# Patient Record
Sex: Female | Born: 1970 | Race: White | Hispanic: No | Marital: Married | Smoking: Former smoker
Health system: Southern US, Community
[De-identification: ages and names within clinical notes are randomized; demographics above are authoritative.]

## PROBLEM LIST (undated history)

## (undated) DIAGNOSIS — F419 Anxiety disorder, unspecified: Secondary | ICD-10-CM

## (undated) HISTORY — PX: WISDOM TOOTH EXTRACTION: SHX21

---

## 2021-03-27 DIAGNOSIS — C801 Malignant (primary) neoplasm, unspecified: Secondary | ICD-10-CM

## 2021-03-27 HISTORY — DX: Malignant (primary) neoplasm, unspecified: C80.1

## 2021-04-01 ENCOUNTER — Encounter (HOSPITAL_COMMUNITY): Payer: Self-pay

## 2021-04-01 ENCOUNTER — Other Ambulatory Visit: Payer: Self-pay

## 2021-04-01 ENCOUNTER — Ambulatory Visit (HOSPITAL_COMMUNITY)
Admission: EM | Admit: 2021-04-01 | Discharge: 2021-04-01 | Disposition: A | Discharge status: Home / Self Care | Payer: 59 | Attending: Emergency Medicine | Admitting: Emergency Medicine

## 2021-04-01 DIAGNOSIS — N644 Mastodynia: Secondary | ICD-10-CM | POA: Diagnosis not present

## 2021-04-01 DIAGNOSIS — N6320 Unspecified lump in the left breast, unspecified quadrant: Secondary | ICD-10-CM

## 2021-04-01 DIAGNOSIS — N6452 Nipple discharge: Secondary | ICD-10-CM | POA: Diagnosis not present

## 2021-04-01 DIAGNOSIS — N63 Unspecified lump in unspecified breast: Secondary | ICD-10-CM

## 2021-04-01 NOTE — Discharge Instructions (Addendum)
If you have not heard from the [facility] by the end of the day, call to schedule an appointment.    Follow up with your primary care provider.

## 2021-04-01 NOTE — ED Notes (Signed)
Fax sent to [facility]

## 2021-04-01 NOTE — ED Triage Notes (Signed)
Pt presents with left breast pain x 2 months. Reports she had discharge this morning, can't remember the color as she was afraid to check. Pt reports she is going throw menopause since Feb 2022.

## 2021-04-01 NOTE — ED Provider Notes (Signed)
[facility]    CSN: [id number] Arrival date & time: 04/01/21  1121      History   Chief Complaint Chief Complaint  Patient presents with  . Breast Pain    HPI Sheena Mcdaniel is a 50 y.o. female.   Patient is here for evaluation of left breast pain and discharge.  Reports developed left sided breast pain and tenderness approximately 2 months ago.  Reports this morning noticed some crusting and discharge on her pajama top.  Denies any redness or swelling.  Reports breast "feels tight."  Reports having irregular periods and believes that she is in menopause.  Initially thought breast pain was related to hormonal changes and has not been evaluated for this previously. Denies any specific alleviating or aggravating factors.  Denies any fevers, chest pain, shortness of breath, N/V/D, numbness, tingling, weakness, abdominal pain, or headaches.   ROS: As per HPI, all other pertinent ROS negative   The history is provided by the patient.    History reviewed. No pertinent past medical history.  There are no problems to display for this patient.   History reviewed. No pertinent surgical history.  OB History   No obstetric history on file.      Home Medications    Prior to Admission medications   Not on File    Family History History reviewed. No pertinent family history.  Social History Social History   Tobacco Use  . Smoking status: Never Smoker  . Smokeless tobacco: Never Used  Substance Use Topics  . Alcohol use: Yes  . Drug use: Never     Allergies   Patient has no known allergies.   Review of Systems Review of Systems  All other systems reviewed and are negative.    Physical Exam Triage Vital Signs ED Triage Vitals  Enc Vitals Group     BP 04/01/21 1210 (!) 164/112     Pulse Rate 04/01/21 1210 (!) 118     Resp 04/01/21 1210 18     Temp 04/01/21 1210 98.4 F (36.9 C)     Temp Source 04/01/21 1210 Oral     SpO2 04/01/21 1210 99 %      Weight --      Height --      Head Circumference --      Peak Flow --      Pain Score 04/01/21 1207 9     Pain Loc --      Pain Edu? --      Excl. in [place]? --    No data found.  Updated Vital Signs BP (!) 164/112 (BP Location: Left Arm)   Pulse (!) 118   Temp 98.4 F (36.9 C) (Oral)   Resp 18   LMP  (Within Months) Comment: 1 month  SpO2 99%   Visual Acuity Right Eye Distance:   Left Eye Distance:   Bilateral Distance:    Right Eye Near:   Left Eye Near:    Bilateral Near:     Physical Exam Vitals and nursing note reviewed.  Constitutional:      General: She is not in acute distress.    Appearance: Normal appearance. She is not ill-appearing, toxic-appearing or diaphoretic.  HENT:     Head: Normocephalic and atraumatic.  Eyes:     Conjunctiva/sclera: Conjunctivae normal.  Cardiovascular:     Rate and Rhythm: Normal rate.     Pulses: Normal pulses.  Pulmonary:     Effort: Pulmonary effort is  normal.  Chest:  Breasts:     Right: Normal.     Left: Mass, nipple discharge and tenderness present. No axillary adenopathy or supraclavicular adenopathy.        Comments: Firm, non-mobile, tender mass to left breast Abdominal:     General: Abdomen is flat.  Musculoskeletal:        General: Normal range of motion.     Cervical back: Normal range of motion.  Lymphadenopathy:     Upper Body:     Left upper body: No supraclavicular, axillary or pectoral adenopathy.  Skin:    General: Skin is warm and dry.  Neurological:     General: No focal deficit present.     Mental Status: She is alert and oriented to person, place, and time.  Psychiatric:        Mood and Affect: Mood normal.      UC Treatments / Results  Labs (all labs ordered are listed, but only abnormal results are displayed) Labs Reviewed - No data to display  EKG   Radiology No results found.  Procedures Procedures (including critical care time)  Medications Ordered in UC Medications -  No data to display  Initial Impression / Assessment and Plan / UC Course  I have reviewed the triage vital signs and the nursing notes.  Pertinent labs & imaging results that were available during my care of the patient were reviewed by me and considered in my medical decision making (see chart for details).    Left breast tenderness, breast mass, nipple discharge Concern for left breast abscess or carcinoma.  Orders placed for diagnostic mammogram and ultrasound.  We will follow up on results PCP referral placed.   Final Clinical Impressions(s) / UC Diagnoses   Final diagnoses:  Breast tenderness  Nipple discharge  Breast mass     Discharge Instructions     If you have not heard from the [facility] by the end of the day, call to schedule an appointment.    Follow up with your primary care provider.        ED Prescriptions    None     PDMP not reviewed this encounter.   Pearson Forster, NP 04/01/21 [phone]

## 2021-04-01 NOTE — ED Notes (Signed)
Attempted to call [facility] . Apolonio Schneiders L,PA aware, Will fax form to [facility]

## 2021-04-01 NOTE — ED Notes (Signed)
Blood pressure reported to Vickki Muff, NP.

## 2021-04-24 ENCOUNTER — Other Ambulatory Visit: Payer: 59

## 2021-04-24 ENCOUNTER — Other Ambulatory Visit: Payer: Self-pay

## 2021-04-24 ENCOUNTER — Encounter: Payer: Self-pay | Admitting: Internal Medicine

## 2021-04-24 ENCOUNTER — Ambulatory Visit: Payer: 59 | Admitting: Internal Medicine

## 2021-04-24 VITALS — BP 162/80 | HR 87 | Temp 97.8°F | Ht 63.75 in | Wt 165.3 lb

## 2021-04-24 DIAGNOSIS — N644 Mastodynia: Secondary | ICD-10-CM | POA: Diagnosis not present

## 2021-04-24 DIAGNOSIS — N951 Menopausal and female climacteric states: Secondary | ICD-10-CM | POA: Diagnosis not present

## 2021-04-24 DIAGNOSIS — R030 Elevated blood-pressure reading, without diagnosis of hypertension: Secondary | ICD-10-CM | POA: Insufficient documentation

## 2021-04-24 MED ORDER — CEPHALEXIN 500 MG PO CAPS: 500.0000 mg | ORAL_CAPSULE | Freq: Three times a day (TID) | ORAL | 0 refills | Status: AC

## 2021-04-24 NOTE — Patient Instructions (Signed)
Thank you for allowing [place] to provide your care today. Today we discussed your left breast pain.    I have put in an order for a left breast US. I have also sent in a prescription for cephalexin 500 mg three time a day.  Please follow-up in 1-2 months.    Should you have any questions or concerns please call the internal medicine clinic at [phone].

## 2021-04-24 NOTE — Assessment & Plan Note (Signed)
Patient reports perimenopausal symptoms.  States in February of this year she noticed changes in her menstruation.  She has had regular periods since they started.  She states a few months ago she had a very short period lasting for about 3 days followed with no cycle the following month.  She states her menstruation last anywhere from 3 to 8 days.  In the past her cycle was about 5 days long.  She has had a few months with no cycle.

## 2021-04-24 NOTE — Assessment & Plan Note (Signed)
Patient presents for evaluation of her left breast pain and drainage.  States that in February she noticed significant engorgement and left-sided firmness of her left breast.  She describes dull sharp and achy intermittent pain ongoing since February.  A few weeks ago she noticed significant drainage after waking up going from supine to sitting position.  She reports a gushing sensation with significant straw-colored drainage.  She states that the pain feels similar to mastitis which she experienced when she used to breast-feed.  No family history of breast cancer or other cancers.  She has never had a mammogram.  She was scheduled to go today however due to the significant pain she could not tolerate a mammogram.  She states that she has been taking Tylenol and ibuprofen as well as alternating heat and ice therapy all of which have provided relief.  She states that heat and ice cause the firmness to decrease.  Denies any fevers, nausea or vomiting.  Does endorse chills.  Assessment/plan: Differential diagnosis is concerning for inflammatory breast cancer versus abscess.  -Referral for left breast ultrasound -Will treat empirically with cephalexin 500 mg 3 times daily in the setting of possible abscess

## 2021-04-24 NOTE — Progress Notes (Signed)
   CC: establish care, breast pain  HPI:  Ms.Jalisha Abie Killian is a 50 y.o. with no past medical history presenting for evaluation of her left breast pain and to establish care. For details of today's visit and the status of his chronic medical issues please refer to the assessment and plan.   No past medical history on file.   Surgical history: None.  Allergies: No known drug allergies, seasonal allergies to pollen  Social history: Lives at home with her husband and children.  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  Denies any tobacco or illicit drug use.  Seldomly drinks alcohol on social occasions.  Review of Systems:   Review of Systems  Constitutional: Positive for chills. Negative for fever and weight loss.  Respiratory: Negative for shortness of breath.   Cardiovascular: Negative for chest pain and leg swelling.  Gastrointestinal: Negative for abdominal pain, nausea and vomiting.  Musculoskeletal: Positive for myalgias.  Skin: Negative for itching and rash.  Neurological: Negative for dizziness, weakness and headaches.     Physical Exam:  Vitals:   04/24/21 1049  BP: (!) 159/88  Pulse: (!) 108  SpO2: 100%  Weight: 165 lb 4.8 oz (75 kg)  Height: 5' 3.75" (1.619 m)   Physical Exam Vitals reviewed.  Constitutional:      Appearance: Normal appearance.  Cardiovascular:     Rate and Rhythm: Regular rhythm. Tachycardia present.     Pulses: Normal pulses.     Heart sounds: Normal heart sounds. No murmur heard. No friction rub. No gallop.   Pulmonary:     Effort: Pulmonary effort is normal. No respiratory distress.     Breath sounds: Normal breath sounds. No wheezing or rales.  Chest:  Breasts:     Left: No axillary adenopathy.    Abdominal:     General: Abdomen is flat. Bowel sounds are normal. There is no distension.     Palpations: Abdomen is soft.     Tenderness: There is no abdominal tenderness. There  is no guarding.  Musculoskeletal:        General: Swelling and tenderness present.     Comments: Significant engorgement of the left breast with tenderness along the left lateral breast.  Straw-colored drainage.  Firm to touch along the lateral half of the breast.  Significant tenderness to palpation.    Lymphadenopathy:     Upper Body:     Left upper body: No axillary adenopathy.  Skin:    General: Skin is warm and dry.  Neurological:     Mental Status: She is alert and oriented to person, place, and time.  Psychiatric:        Mood and Affect: Mood normal.        Behavior: Behavior normal.        Thought Content: Thought content normal.        Judgment: Judgment normal.     Assessment & Plan:   See Encounters Tab for problem based charting.  Patient discussed with Dr. Jimmye Norman

## 2021-04-24 NOTE — Assessment & Plan Note (Signed)
BP Readings from Last 3 Encounters:  04/24/21 (!) 162/80  04/01/21 (!) 164/112   Patient presents today to establish care and for evaluation of her left breast pain.  Elevated blood pressure readings appreciated.  No history of hypertension.  States her blood pressures are normally normotensive.  This is likely situational in the setting of pain and anxiety.  Will continue to monitor at follow-up visits.

## 2021-04-30 NOTE — Progress Notes (Signed)
Internal Medicine Clinic Attending ° °Case discussed with Dr. Rehman  At the time of the visit.  We reviewed the resident’s history and exam and pertinent patient test results.  I agree with the assessment, diagnosis, and plan of care documented in the resident’s note.  ° °

## 2021-05-04 ENCOUNTER — Other Ambulatory Visit: Payer: Self-pay

## 2021-05-04 ENCOUNTER — Ambulatory Visit
Admission: RE | Admit: 2021-05-04 | Discharge: 2021-05-04 | Disposition: A | Discharge status: Home / Self Care | Payer: 59 | Source: Ambulatory Visit | Attending: Emergency Medicine | Admitting: Emergency Medicine

## 2021-05-04 ENCOUNTER — Other Ambulatory Visit: Payer: Self-pay | Admitting: Emergency Medicine

## 2021-05-04 ENCOUNTER — Other Ambulatory Visit (HOSPITAL_COMMUNITY): Payer: Self-pay | Admitting: Emergency Medicine

## 2021-05-04 DIAGNOSIS — N6320 Unspecified lump in the left breast, unspecified quadrant: Secondary | ICD-10-CM

## 2021-05-04 DIAGNOSIS — N6310 Unspecified lump in the right breast, unspecified quadrant: Secondary | ICD-10-CM

## 2021-05-04 DIAGNOSIS — R599 Enlarged lymph nodes, unspecified: Secondary | ICD-10-CM

## 2021-05-04 DIAGNOSIS — N6452 Nipple discharge: Secondary | ICD-10-CM

## 2021-05-04 DIAGNOSIS — N644 Mastodynia: Secondary | ICD-10-CM

## 2021-05-04 IMAGING — MG DIGITAL DIAGNOSTIC BILAT W/ TOMO W/ CAD
8 of 14 series · 8 of 40 positions shown · non-contrast
Comparison: None.

CLINICAL DATA: 50-year-old female with increasingly painful and
enlarging left breast lump over the last several months. The patient
also has had profuse, spontaneous serous left nipple discharge in
that time.

EXAM:
DIGITAL DIAGNOSTIC BILATERAL MAMMOGRAM WITH TOMOSYNTHESIS AND CAD;
ULTRASOUND LEFT BREAST LIMITED; ULTRASOUND RIGHT BREAST LIMITED
TECHNIQUE: Bilateral digital diagnostic mammography and breast tomosynthesis
was performed. The images were evaluated with computer-aided
detection.; Targeted ultrasound examination of the left breast was
performed; Targeted ultrasound examination of the right breast was
performed

[L MLO synth-2D (1 of 2)]
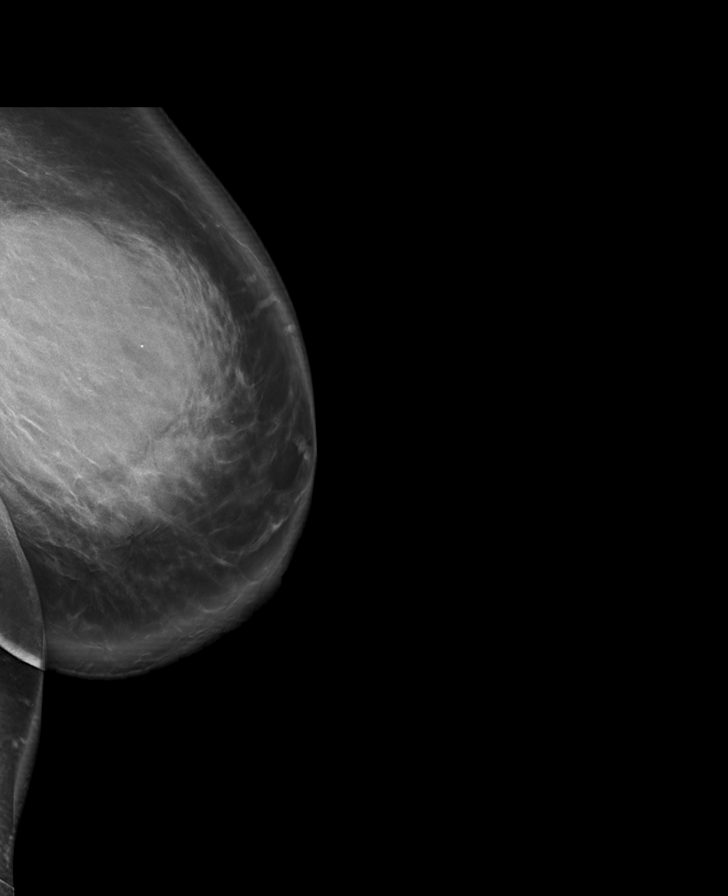

[R MLO synth-2D]
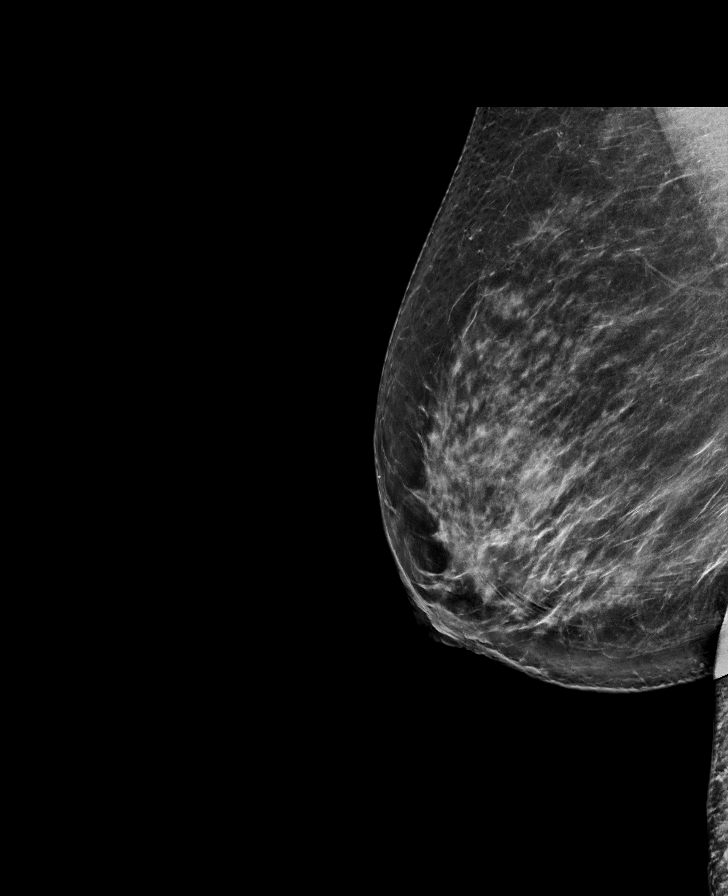

[R CC synth-2D (1 of 3)]
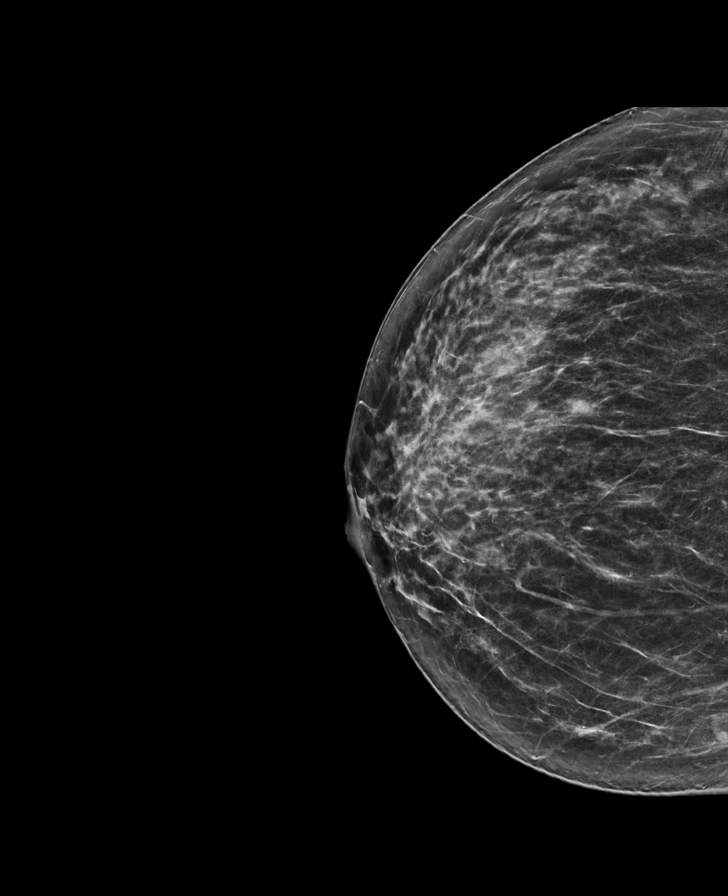

[R CC synth-2D (2 of 3)]
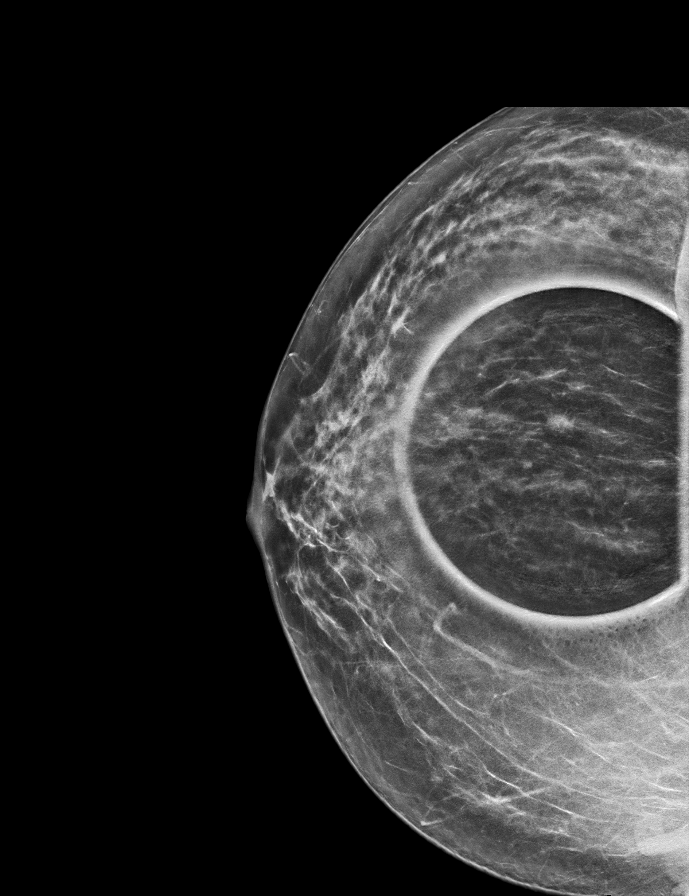

[R CC synth-2D (3 of 3)]
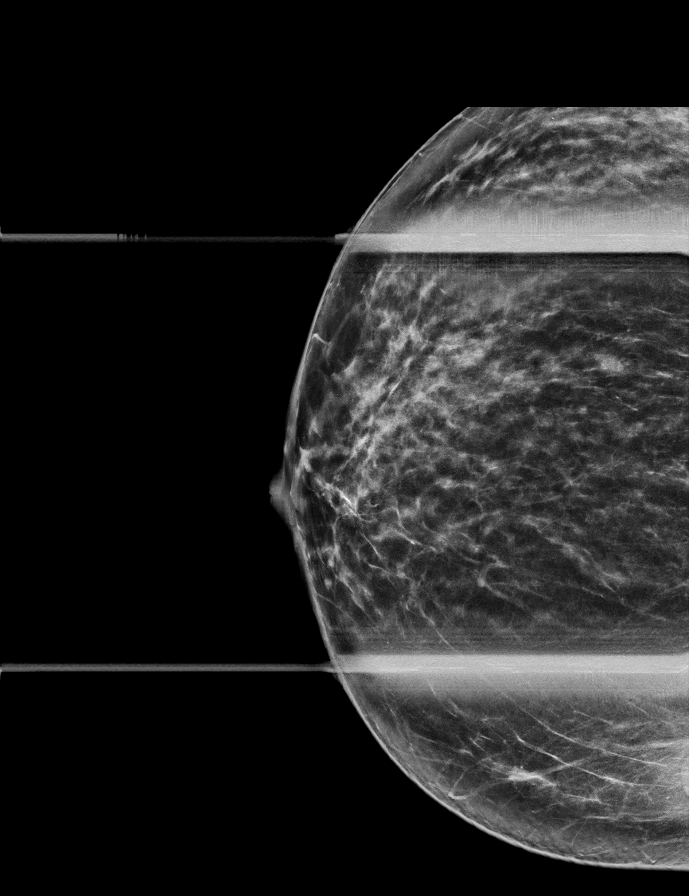

[L CC synth-2D]
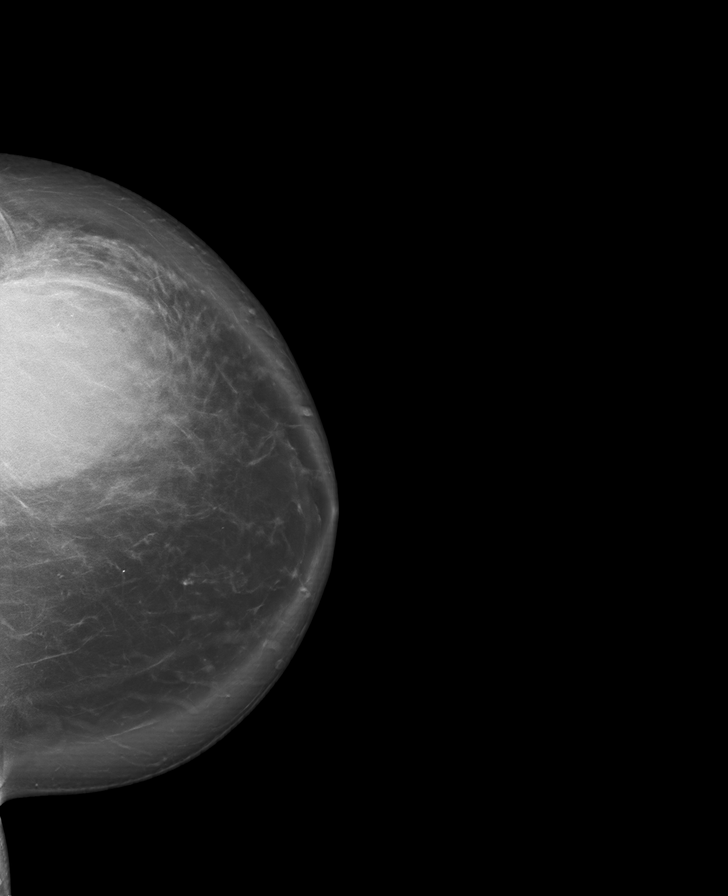

[L MLO synth-2D (2 of 2)]
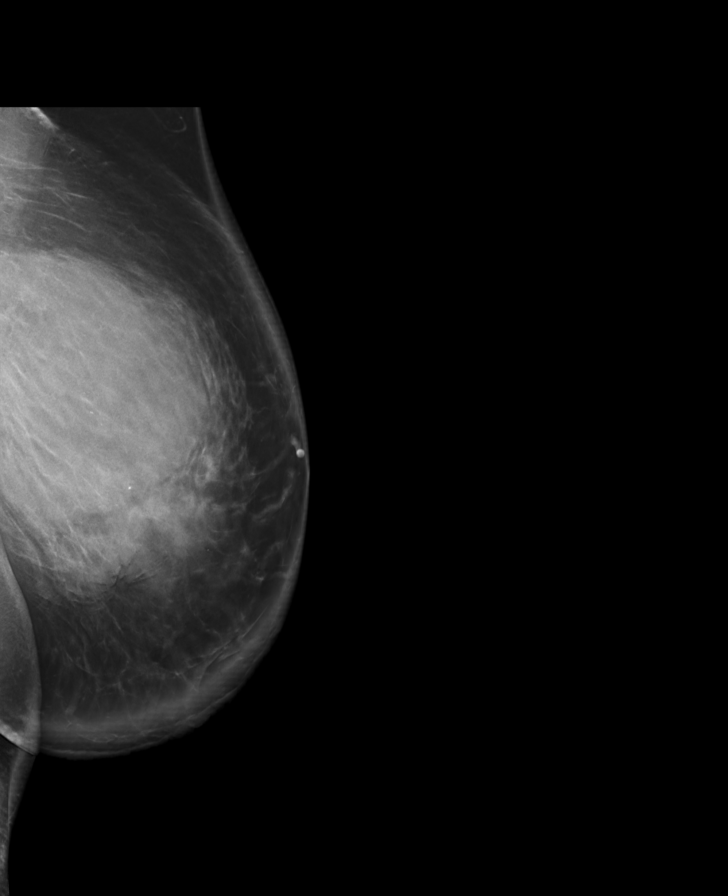

[L CC tomo · tomo slice 59/116.0]
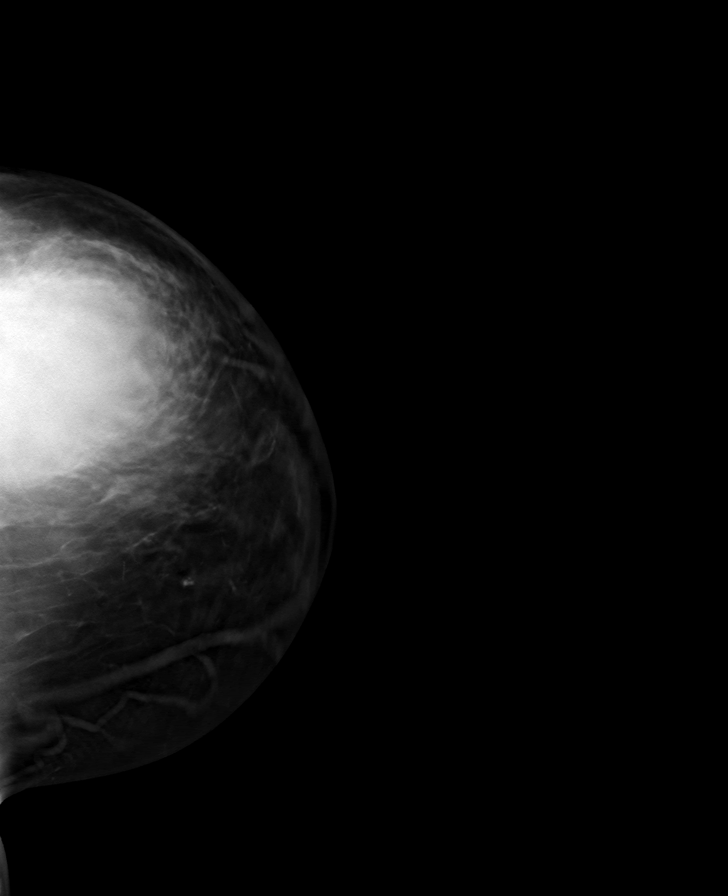

[8 of 40 positions shown; findings below may reference images not displayed]

ACR Breast Density Category c: The breast tissue is heterogeneously
dense, which may obscure small masses.
FINDINGS: There is a large oval, circumscribed hyperdense mass involving the
entire lateral left breast with associated trabecular thickening.
Further evaluation with ultrasound was performed.

There is a persistent oval, circumscribed equal density mass in the
lateral right breast at middle depth. It is best seen on the cc
projection. It localizes superficially on tomosynthesis. No
additional suspicious findings on the right.

On physical exam, I palpate a large, firm and mobile mass in the
deep central and lateral left breast. There is profuse yellow
discharge from the left nipple.

Targeted ultrasound is performed, showing a large, circumscribed
multi-cystic mass with associated vascularity involving the lateral
left breast. Exact measurements are difficult due to the size but
this area measures at least 9-10 cm. Evaluation of the left axilla
demonstrates multiple lymph nodes with diffuse cortical thickening
up to 5 mm.

Evaluation of the right breast demonstrates an oval, circumscribed
hypoechoic mass at the [DATE] position 4 cm from the nipple. It
measures 4 x 4 x 2 mm. There is no internal vascularity. This
correlates well with the mammographic finding.
IMPRESSION: 1. Suspicious, multi-cystic left breast mass measuring up to 9-10
cm. Recommendation is for ultrasound-guided biopsy and aspiration of
the internal cystic pockets for symptomatic relief.
2. Indeterminate left axillary lymph nodes. Recommendation is for
ultrasound-guided biopsy.
3. Probably benign, probable complicated cyst at the [DATE] position
of the right breast. Recommendation is for ultrasound-guided
aspiration for definitive diagnosis at the time of the patient's
other procedures.

RECOMMENDATION:
1. Two area ultrasound-guided biopsy of the left breast and left
axilla. Aspiration of the cystic pockets within the mass should also
be performed at this time for symptomatic relief and can be sent for
cytology.
2. Ultrasound-guided aspiration of a 4 mm mass within the right
breast. If this does not aspirate to completion, biopsy is
recommended.

I have discussed the findings and recommendations with the patient.
If applicable, a reminder letter will be sent to the patient
regarding the next appointment.

BI-RADS CATEGORY  4: Suspicious.

## 2021-05-04 IMAGING — US US BREAST*L* LIMITED INC AXILLA
1 series · 12 of 16 positions shown · non-contrast
Comparison: None.

CLINICAL DATA: 50-year-old female with increasingly painful and
enlarging left breast lump over the last several months. The patient
also has had profuse, spontaneous serous left nipple discharge in
that time.

EXAM:
DIGITAL DIAGNOSTIC BILATERAL MAMMOGRAM WITH TOMOSYNTHESIS AND CAD;
ULTRASOUND LEFT BREAST LIMITED; ULTRASOUND RIGHT BREAST LIMITED
TECHNIQUE: Bilateral digital diagnostic mammography and breast tomosynthesis
was performed. The images were evaluated with computer-aided
detection.; Targeted ultrasound examination of the left breast was
performed; Targeted ultrasound examination of the right breast was
performed

[Series 1: us breast*left* limited inc axilla · 0.13mm/px · 12 of 16 slices shown]
[im 1/16]
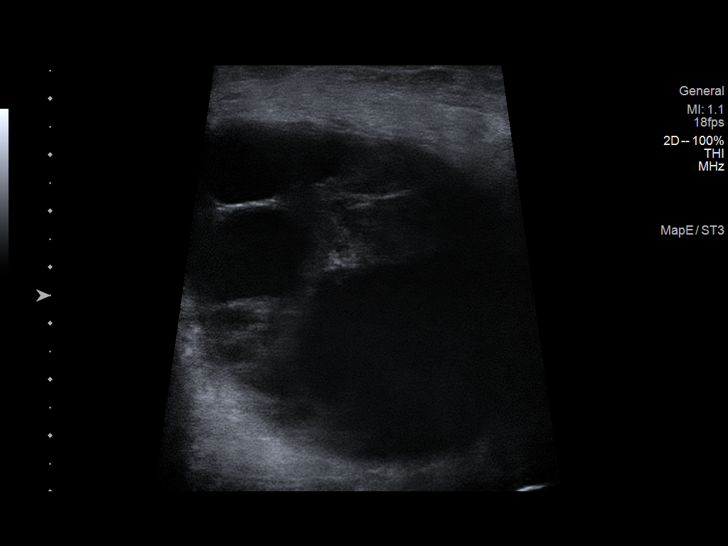
[im 3/16]
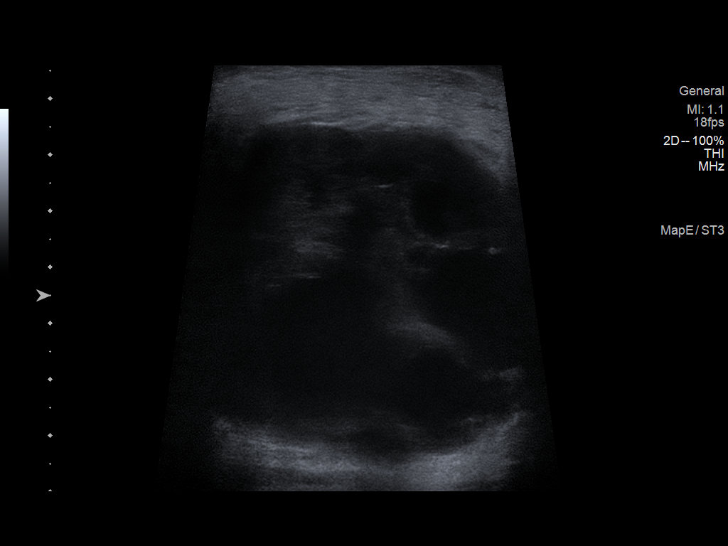
[im 4/16]
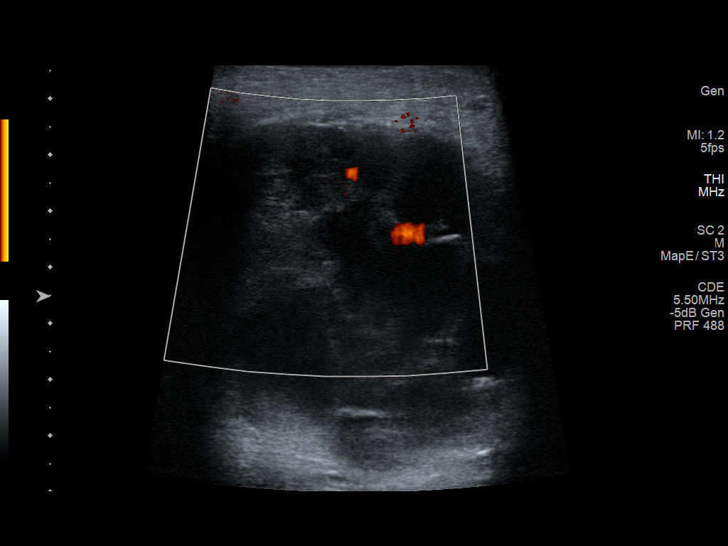
[im 5/16]
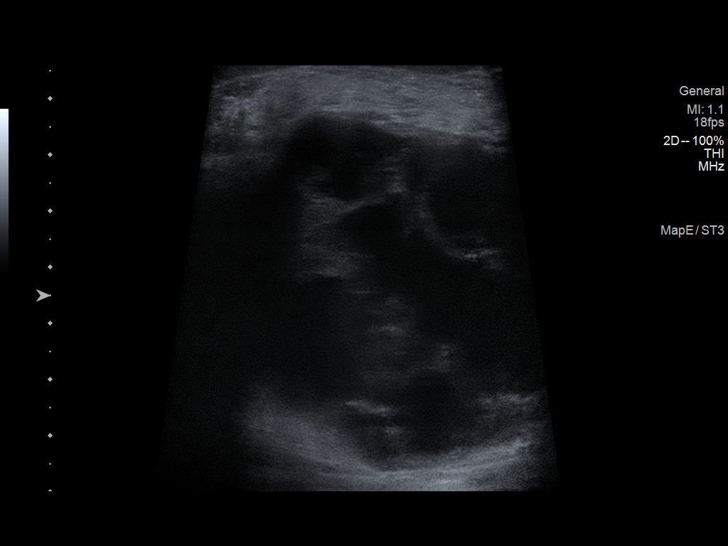
[im 7/16]
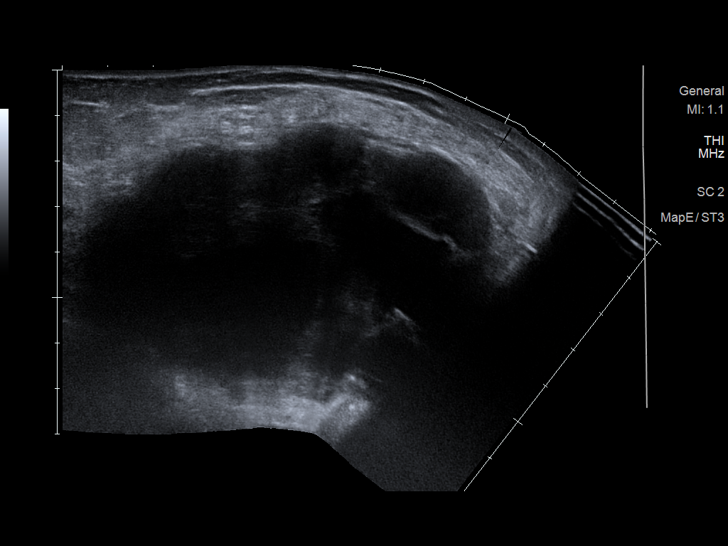
[im 8/16]
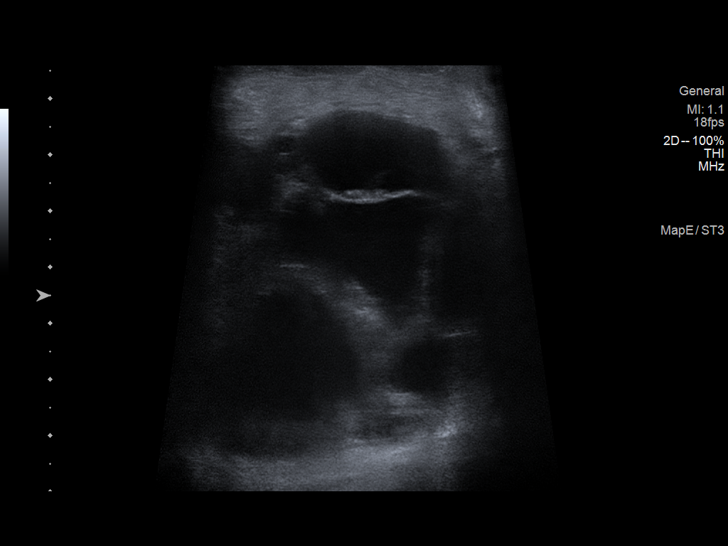
[im 9/16]
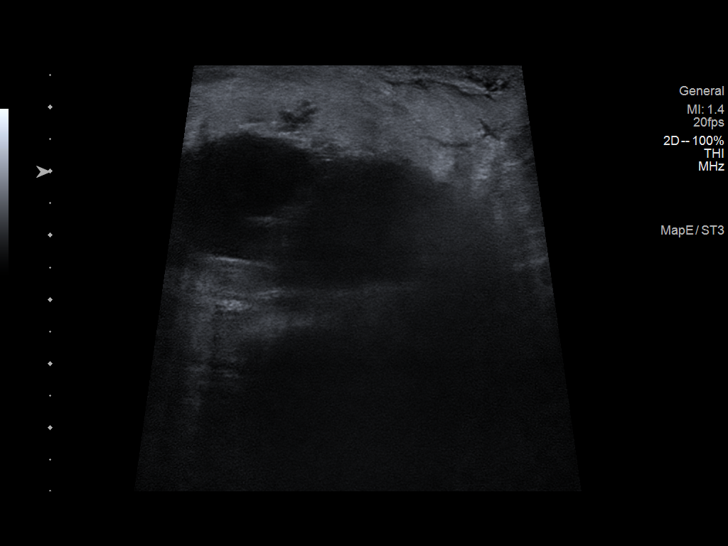
[im 11/16]
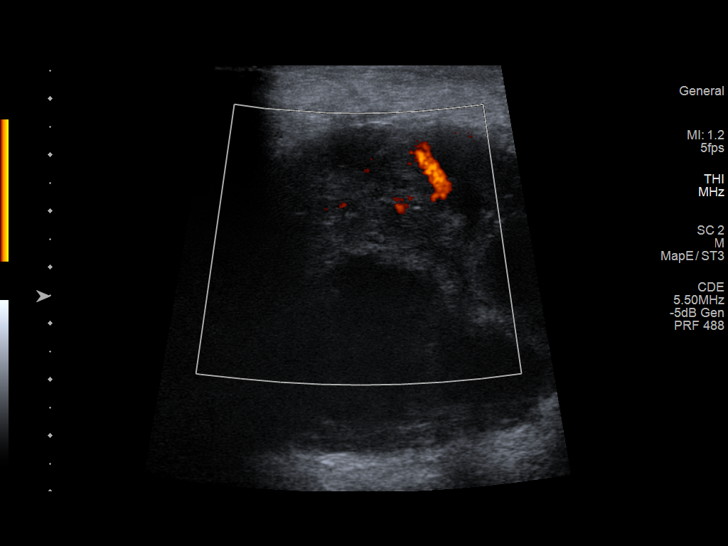
[im 12/16]
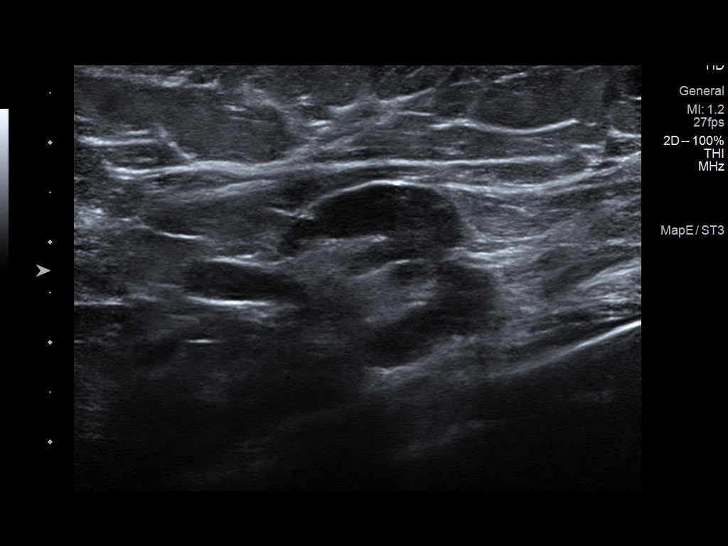
[im 13/16]
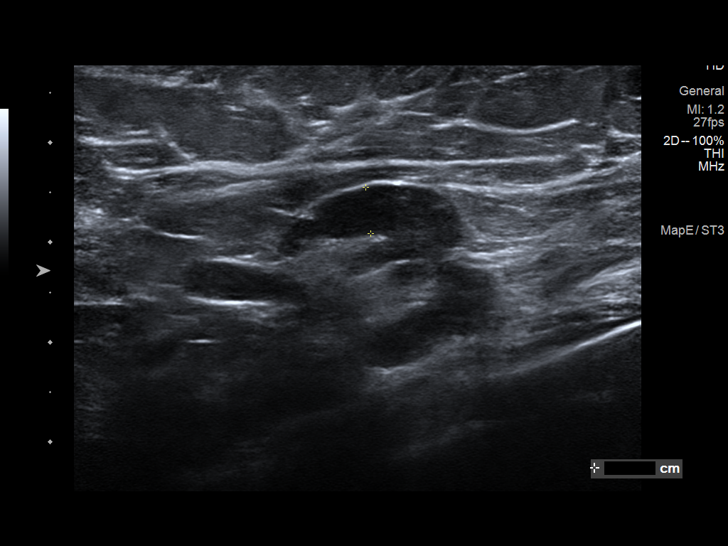
[im 15/16]
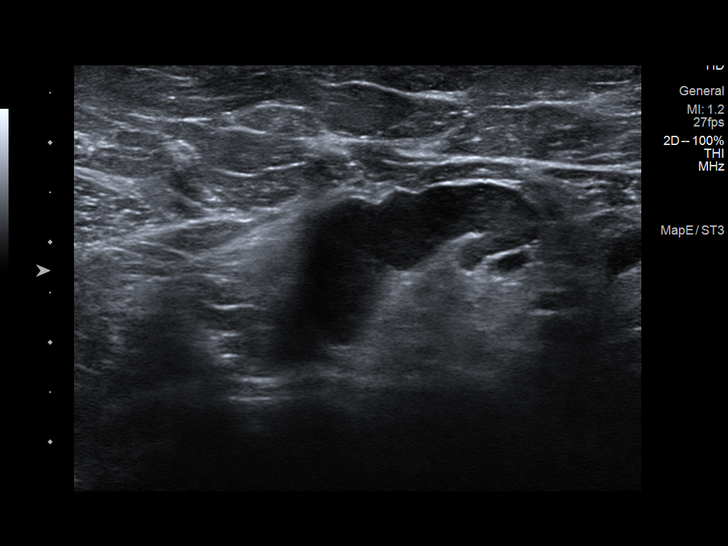
[im 16/16]
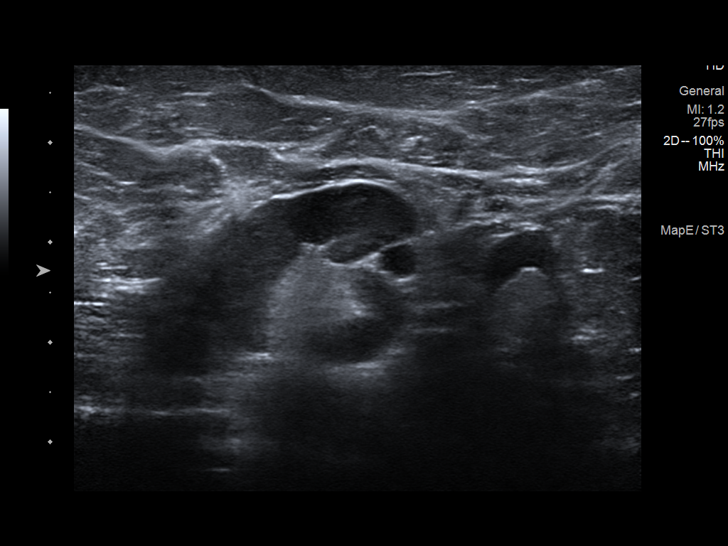

[12 of 16 positions shown; findings below may reference images not displayed]

ACR Breast Density Category c: The breast tissue is heterogeneously
dense, which may obscure small masses.
FINDINGS: There is a large oval, circumscribed hyperdense mass involving the
entire lateral left breast with associated trabecular thickening.
Further evaluation with ultrasound was performed.

There is a persistent oval, circumscribed equal density mass in the
lateral right breast at middle depth. It is best seen on the cc
projection. It localizes superficially on tomosynthesis. No
additional suspicious findings on the right.

On physical exam, I palpate a large, firm and mobile mass in the
deep central and lateral left breast. There is profuse yellow
discharge from the left nipple.

Targeted ultrasound is performed, showing a large, circumscribed
multi-cystic mass with associated vascularity involving the lateral
left breast. Exact measurements are difficult due to the size but
this area measures at least 9-10 cm. Evaluation of the left axilla
demonstrates multiple lymph nodes with diffuse cortical thickening
up to 5 mm.

Evaluation of the right breast demonstrates an oval, circumscribed
hypoechoic mass at the [DATE] position 4 cm from the nipple. It
measures 4 x 4 x 2 mm. There is no internal vascularity. This
correlates well with the mammographic finding.
IMPRESSION: 1. Suspicious, multi-cystic left breast mass measuring up to 9-10
cm. Recommendation is for ultrasound-guided biopsy and aspiration of
the internal cystic pockets for symptomatic relief.
2. Indeterminate left axillary lymph nodes. Recommendation is for
ultrasound-guided biopsy.
3. Probably benign, probable complicated cyst at the [DATE] position
of the right breast. Recommendation is for ultrasound-guided
aspiration for definitive diagnosis at the time of the patient's
other procedures.

RECOMMENDATION:
1. Two area ultrasound-guided biopsy of the left breast and left
axilla. Aspiration of the cystic pockets within the mass should also
be performed at this time for symptomatic relief and can be sent for
cytology.
2. Ultrasound-guided aspiration of a 4 mm mass within the right
breast. If this does not aspirate to completion, biopsy is
recommended.

I have discussed the findings and recommendations with the patient.
If applicable, a reminder letter will be sent to the patient
regarding the next appointment.

BI-RADS CATEGORY  4: Suspicious.

## 2021-05-06 ENCOUNTER — Ambulatory Visit
Admission: RE | Admit: 2021-05-06 | Discharge: 2021-05-06 | Disposition: A | Discharge status: Home / Self Care | Payer: 59 | Source: Ambulatory Visit | Attending: Emergency Medicine | Admitting: Emergency Medicine

## 2021-05-06 ENCOUNTER — Other Ambulatory Visit: Payer: Self-pay

## 2021-05-06 ENCOUNTER — Other Ambulatory Visit: Payer: Self-pay | Admitting: Emergency Medicine

## 2021-05-06 DIAGNOSIS — N6320 Unspecified lump in the left breast, unspecified quadrant: Secondary | ICD-10-CM

## 2021-05-06 DIAGNOSIS — N6310 Unspecified lump in the right breast, unspecified quadrant: Secondary | ICD-10-CM

## 2021-05-06 DIAGNOSIS — R599 Enlarged lymph nodes, unspecified: Secondary | ICD-10-CM

## 2021-05-06 IMAGING — US US BREAST BX W LOC DEV 1ST LESION IMG BX SPEC US GUIDE*L*
1 series · 7 of 7 positions shown · non-contrast
Comparison: Previous exam(s).
COMPARISON: Previous exam(s).

Addendum:
CLINICAL DATA: 50-year-old female with a suspicious, rapidly
enlarging left breast mass and suspicious left axillary
lymphadenopathy. The mass demonstrates both solid and cystic
components with profuse serous fluid drainage from the left nipple.
An additional indeterminate mass was also noted on the right side,
which was biopsied today.

EXAM:
ULTRASOUND GUIDED LEFT BREAST CORE NEEDLE BIOPSY
ULTRASOUND-GUIDED LEFT BREAST ASPIRATION
ULTRASOUND-GUIDED RIGHT BREAST CORE NEEDLE BIOPSY

[Series 1: us breast bx w loc dev 1st lesion img bx spec us g · 0.09mm/px · 7 of 7 slices shown]
[im 1/7]
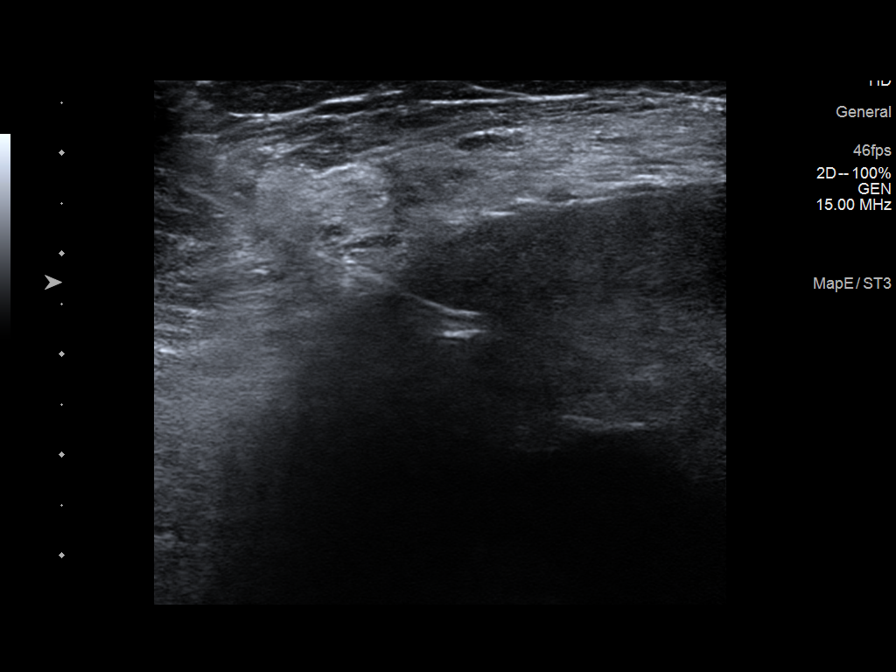
[im 2/7]
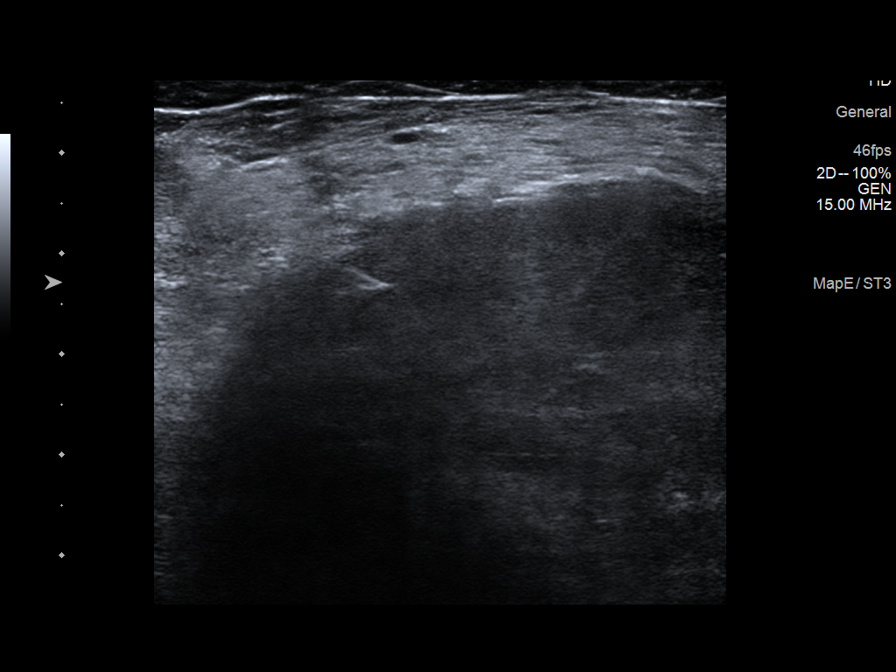
[im 3/7]
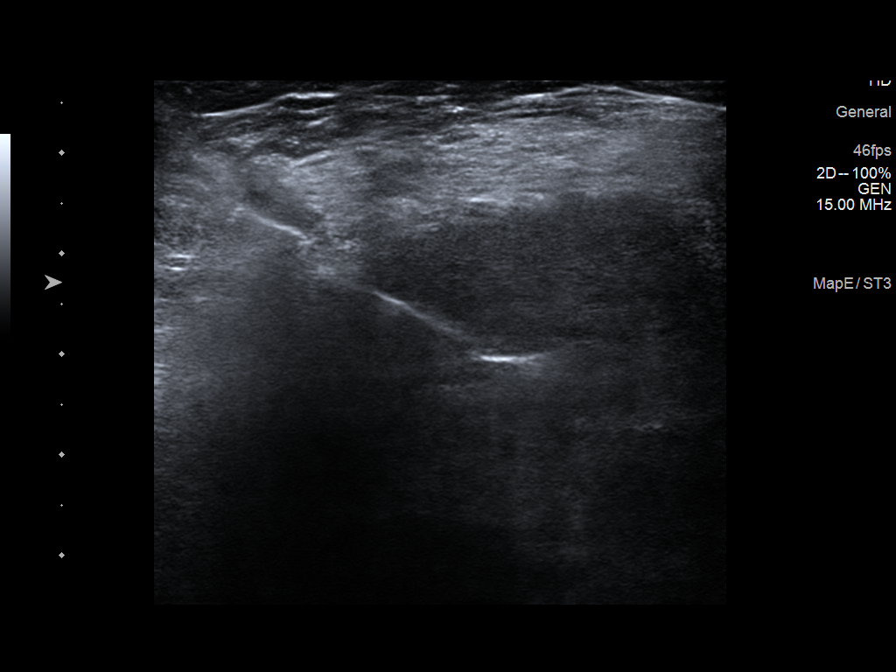
[im 4/7]
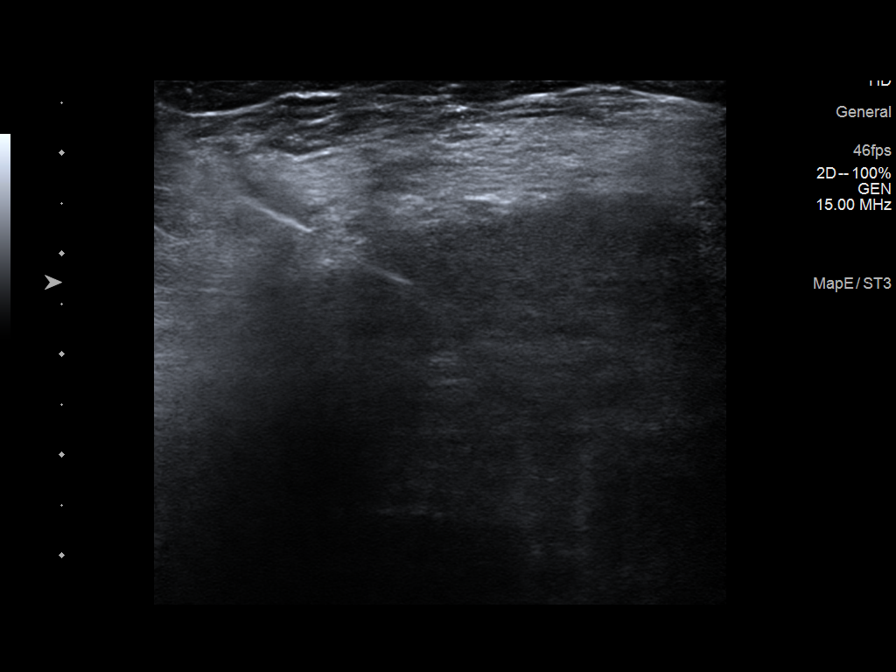
[im 5/7]
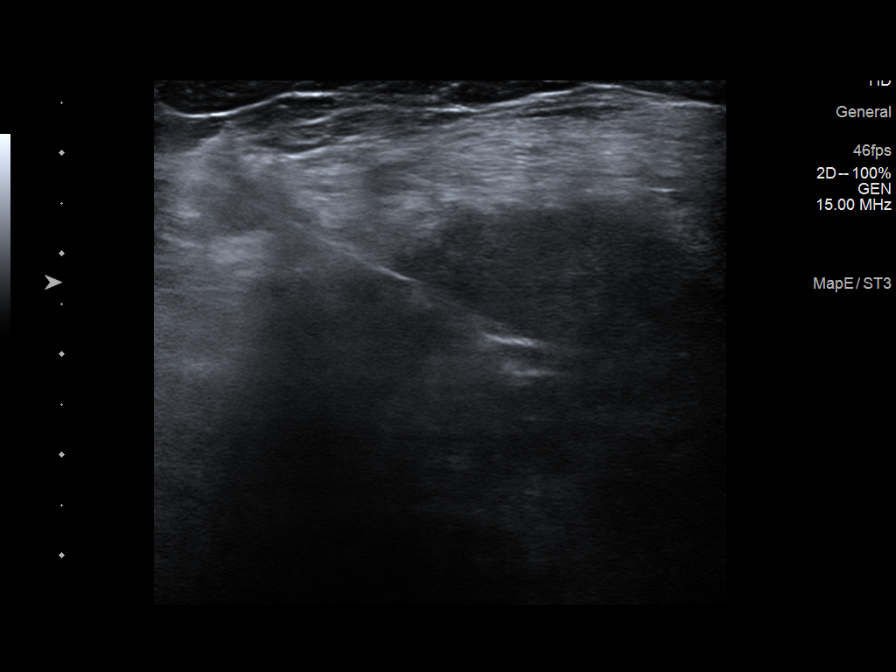
[im 6/7]
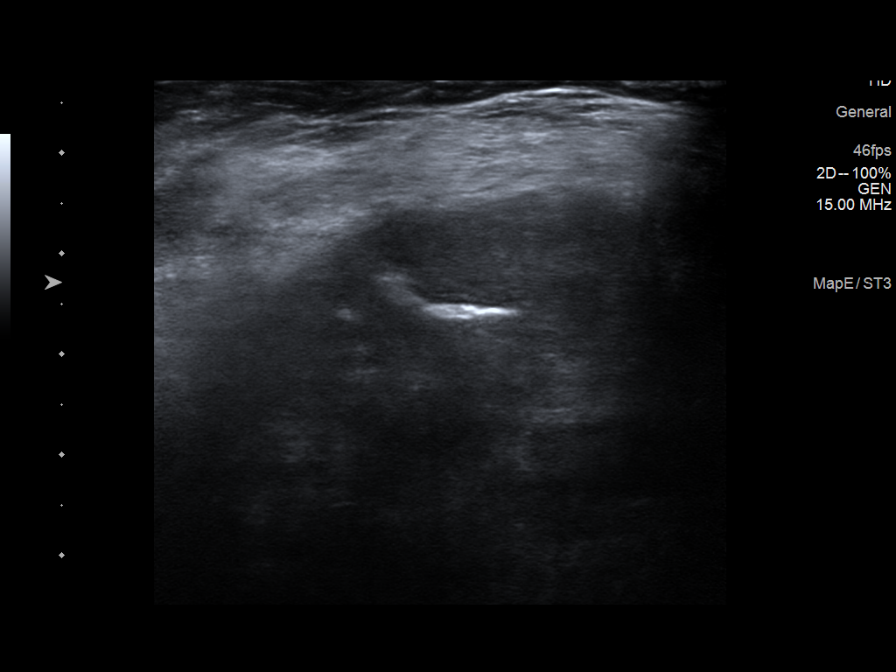
[im 7/7]
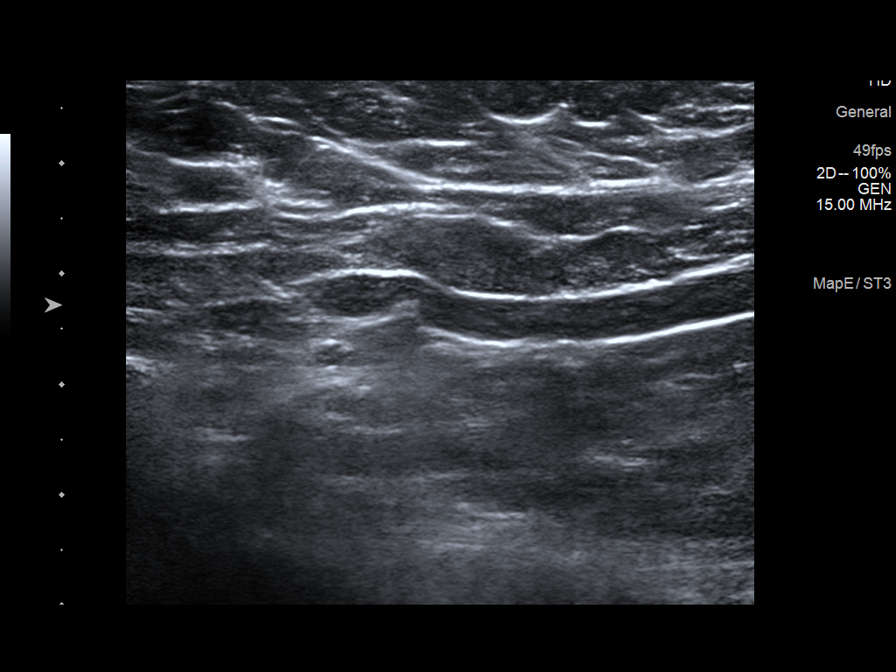

[7 of 7 positions shown; findings below may reference images not displayed]



Lesion quadrant: Upper outer quadrant

Using sterile technique, 1% lidocaine, under direct ultrasound
visualization, needle aspiration of the loculated components of the
mass was performed. Approximately 75 cc of serosanguineous and
bloody fluid was obtained and sent to cytology.

Using sterile technique and 1% Lidocaine as local anesthetic, under
direct ultrasound visualization, a 14 gauge DANIEL HUGO device was
used to perform biopsy of the solid components of the mass using a
lateral approach. At the conclusion of the procedure a ribbon shaped
tissue marker clip was deployed into the biopsy cavity. Follow up 2
view mammogram was deferred at this time.

Preprocedural scanning of the left axilla was performed. The
previously demonstrated, thickened lymph nodes are identified
adjacent to an surrounded by numerous large axillary vessels. The
patient had difficulty keeping her left arm in open position
secondary to pain and anxiety. A safe window for biopsy could not be
obtained. Therefore, biopsy of a left axillary lymph node was not
performed today.

Lesion quadrant: Upper outer quadrant

Using sterile technique and 1% Lidocaine as local anesthetic, under
direct ultrasound visualization, a 14 gauge DANIEL HUGO device was
used to perform biopsy of the mass at the [DATE] position of the right
breast using a lateral approach. At the conclusion of the procedure
a ribbon shaped tissue marker clip was deployed into the biopsy
cavity. Follow up 2 view mammogram was performed and dictated
separately
IMPRESSION: Ultrasound guided biopsy and aspiration of a left breast solid and
cystic mass. No apparent complications.

Ultrasound-guided biopsy of a left axillary lymph node could not be
safely performed due to multiple large vessels coursing around the
lymph nodes and patient difficulty holding the arm in a still
upright position due to anxiety and pain.

Ultrasound-guided biopsy of a right breast mass. No apparent
complications.

ADDENDUM:
Pathology revealed GRADE II-III INVASIVE MAMMARY CARCINOMA of the
LEFT breast, 3:00 o'clock. Immunohistochemistry for E-Cadherin is
positive consistent with ductal carcinoma. This was found to be
concordant by Dr. DANIEL HUGO.

Cytopathology revealed NO MALIGNANT CELLS IDENTIFIED of the LEFT
breast aspiration, 3:00 o'clock. This was found to be discordant by
Dr. DANIEL HUGO with continued workup and treatment per above
diagnosis.

Pathology revealed FIBROCYSTIC CHANGES WITH USUAL DUCTAL HYPERPLASIA
of the RIGHT breast, 9:30 o'clock. This was found to be concordant
by Dr. DANIEL HUGO, with stereotatic guided biopsy recommended.

Pathology results were discussed with the patient and her husband by
telephone on [DATE]. This was after multiple unsuccessful
attempts to contact the patient by the nurse navigator staff and
myself throughout the day and evening on [DATE]. The patient
reported doing well after the biopsies with tenderness BILATERALLY,
and drainage at the site of the aspiration in the LEFT breast. Post
biopsy instructions and care were reviewed and questions were
answered. The patient was encouraged to call The [REDACTED] of
number was provided.

Medical Oncology consultation has been arranged with Dr. DANIEL HUGO

The patient is scheduled for RIGHT breast stereotatic guided biopsy
be guided by the results of this biopsy.

NOTE: Ultrasound-guided biopsy of a LEFT axillary lymph node could
not be safely performed due to multiple large vessels coursing
around the lymph nodes and patient difficulty holding the arm in a
still upright position due to anxiety and pain.

Recommendation for a bilateral breast MRI to exclude any additional
sites of disease.

Pathology results reported by DANIEL HUGO, RN on [DATE].



Lesion quadrant: Upper outer quadrant

Using sterile technique, 1% lidocaine, under direct ultrasound
visualization, needle aspiration of the loculated components of the
mass was performed. Approximately 75 cc of serosanguineous and
bloody fluid was obtained and sent to cytology.

Using sterile technique and 1% Lidocaine as local anesthetic, under
direct ultrasound visualization, a 14 gauge DANIEL HUGO device was
used to perform biopsy of the solid components of the mass using a
lateral approach. At the conclusion of the procedure a ribbon shaped
tissue marker clip was deployed into the biopsy cavity. Follow up 2
view mammogram was deferred at this time.

Preprocedural scanning of the left axilla was performed. The
previously demonstrated, thickened lymph nodes are identified
adjacent to an surrounded by numerous large axillary vessels. The
patient had difficulty keeping her left arm in open position
secondary to pain and anxiety. A safe window for biopsy could not be
obtained. Therefore, biopsy of a left axillary lymph node was not
performed today.

Lesion quadrant: Upper outer quadrant

Using sterile technique and 1% Lidocaine as local anesthetic, under
direct ultrasound visualization, a 14 gauge DANIEL HUGO device was
used to perform biopsy of the mass at the [DATE] position of the right
breast using a lateral approach. At the conclusion of the procedure
a ribbon shaped tissue marker clip was deployed into the biopsy
cavity. Follow up 2 view mammogram was performed and dictated
separately
IMPRESSION: Ultrasound guided biopsy and aspiration of a left breast solid and
cystic mass. No apparent complications.

Ultrasound-guided biopsy of a left axillary lymph node could not be
safely performed due to multiple large vessels coursing around the
lymph nodes and patient difficulty holding the arm in a still
upright position due to anxiety and pain.

Ultrasound-guided biopsy of a right breast mass. No apparent
complications.

## 2021-05-06 IMAGING — US US  BREAST BX W/ LOC DEV 1ST LESION IMG BX SPEC US GUIDE*R*
1 series · 4 of 4 positions shown · non-contrast
Comparison: Previous exam(s).
COMPARISON: Previous exam(s).

Addendum:
CLINICAL DATA: 50-year-old female with a suspicious, rapidly
enlarging left breast mass and suspicious left axillary
lymphadenopathy. The mass demonstrates both solid and cystic
components with profuse serous fluid drainage from the left nipple.
An additional indeterminate mass was also noted on the right side,
which was biopsied today.

EXAM:
ULTRASOUND GUIDED LEFT BREAST CORE NEEDLE BIOPSY
ULTRASOUND-GUIDED LEFT BREAST ASPIRATION
ULTRASOUND-GUIDED RIGHT BREAST CORE NEEDLE BIOPSY

[Series 1: us breast bx w/ loc dev 1st lesion img bx spec us  · 0.06mm/px · 4 of 4 slices shown]
[im 1/4]
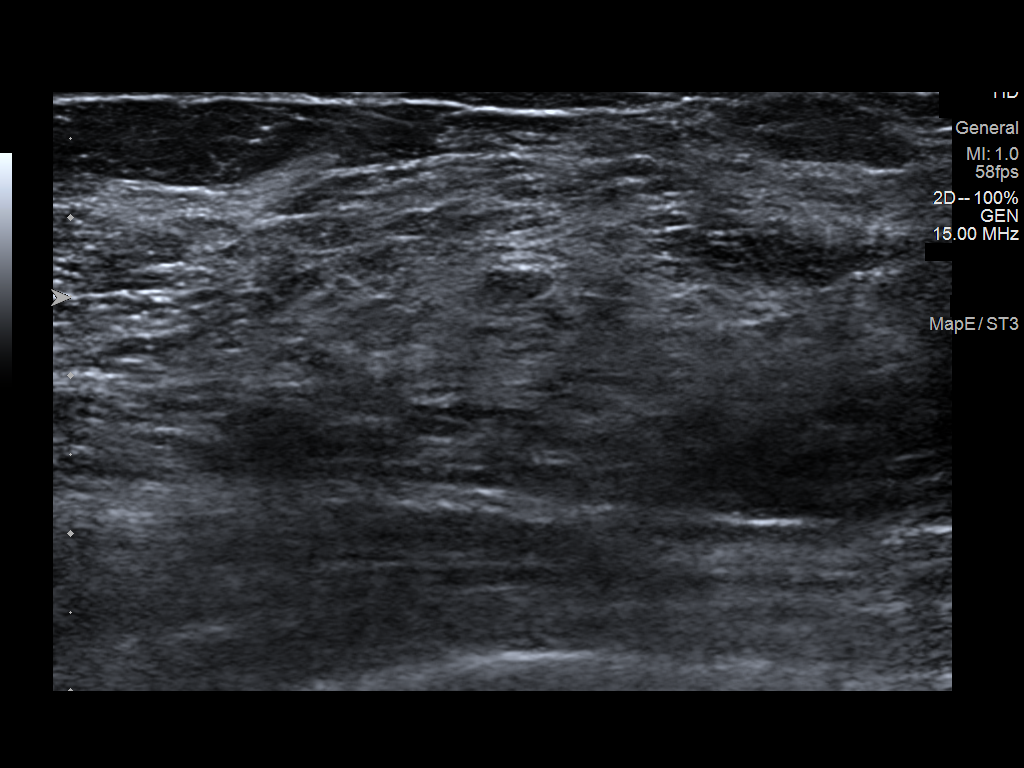
[im 2/4]
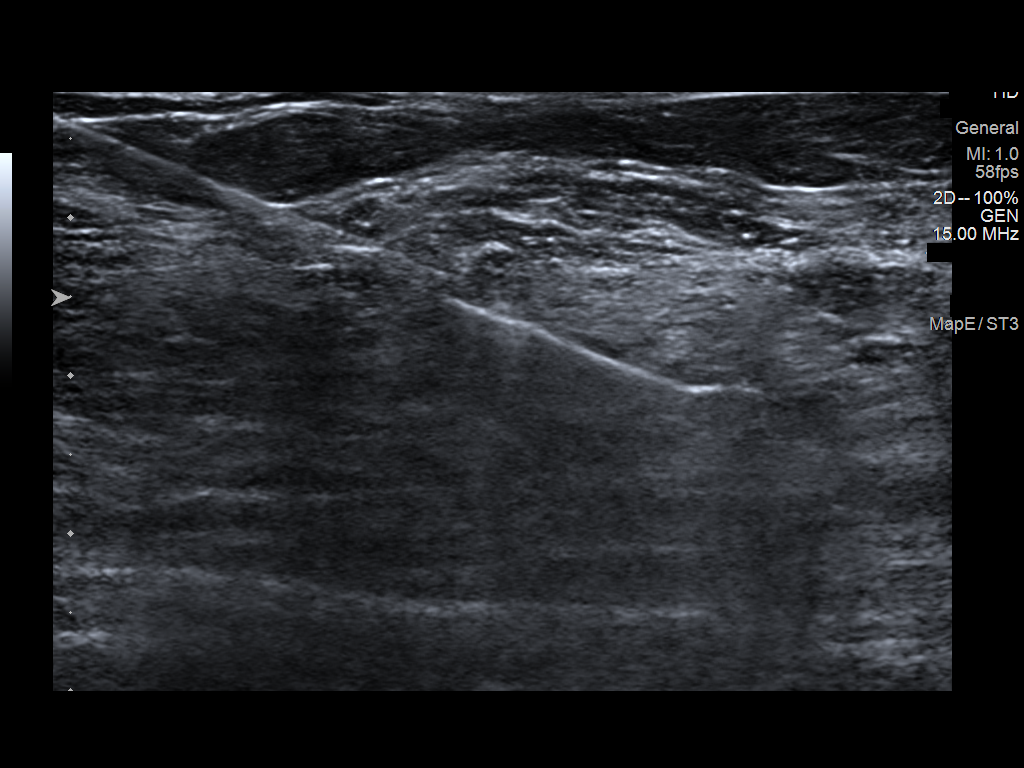
[im 3/4]
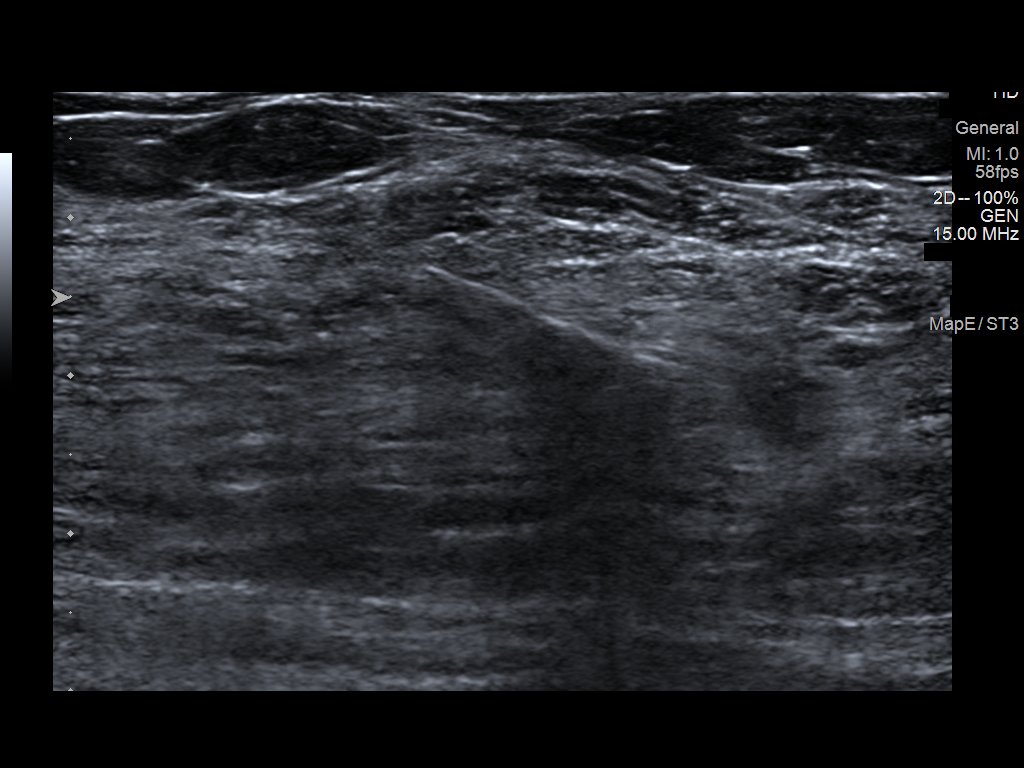
[im 4/4]
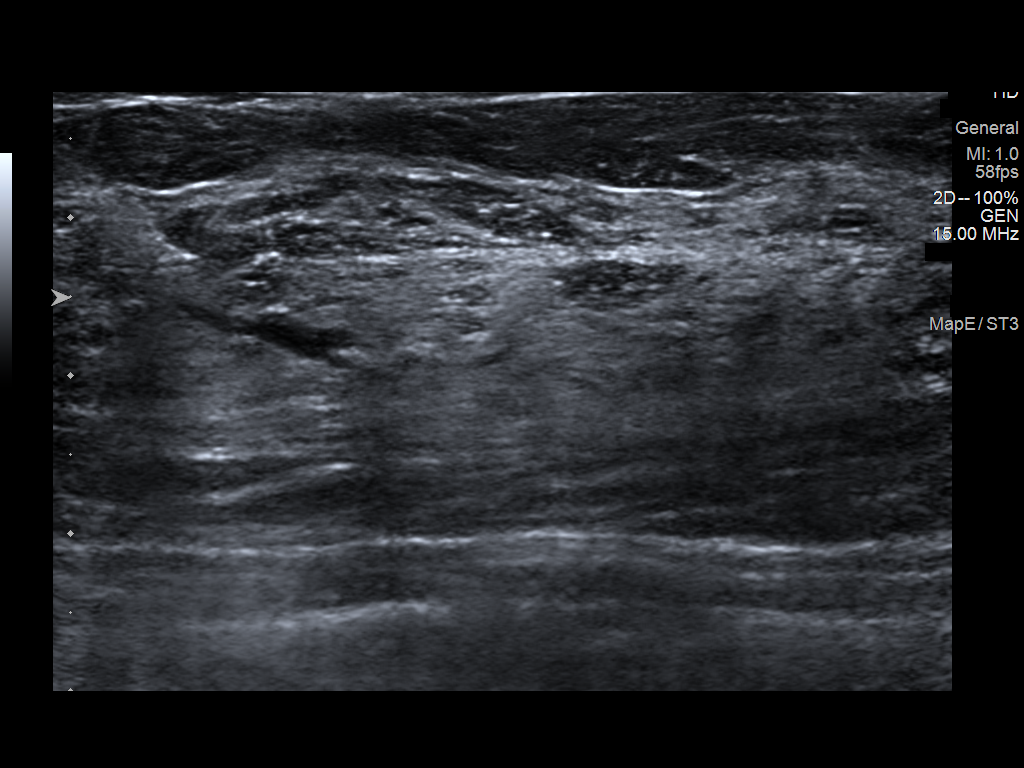

[4 of 4 positions shown; findings below may reference images not displayed]



Lesion quadrant: Upper outer quadrant

Using sterile technique, 1% lidocaine, under direct ultrasound
visualization, needle aspiration of the loculated components of the
mass was performed. Approximately 75 cc of serosanguineous and
bloody fluid was obtained and sent to cytology.

Using sterile technique and 1% Lidocaine as local anesthetic, under
direct ultrasound visualization, a 14 gauge DANIEL HUGO device was
used to perform biopsy of the solid components of the mass using a
lateral approach. At the conclusion of the procedure a ribbon shaped
tissue marker clip was deployed into the biopsy cavity. Follow up 2
view mammogram was deferred at this time.

Preprocedural scanning of the left axilla was performed. The
previously demonstrated, thickened lymph nodes are identified
adjacent to an surrounded by numerous large axillary vessels. The
patient had difficulty keeping her left arm in open position
secondary to pain and anxiety. A safe window for biopsy could not be
obtained. Therefore, biopsy of a left axillary lymph node was not
performed today.

Lesion quadrant: Upper outer quadrant

Using sterile technique and 1% Lidocaine as local anesthetic, under
direct ultrasound visualization, a 14 gauge DANIEL HUGO device was
used to perform biopsy of the mass at the [DATE] position of the right
breast using a lateral approach. At the conclusion of the procedure
a ribbon shaped tissue marker clip was deployed into the biopsy
cavity. Follow up 2 view mammogram was performed and dictated
separately
IMPRESSION: Ultrasound guided biopsy and aspiration of a left breast solid and
cystic mass. No apparent complications.

Ultrasound-guided biopsy of a left axillary lymph node could not be
safely performed due to multiple large vessels coursing around the
lymph nodes and patient difficulty holding the arm in a still
upright position due to anxiety and pain.

Ultrasound-guided biopsy of a right breast mass. No apparent
complications.

ADDENDUM:
Pathology revealed GRADE II-III INVASIVE MAMMARY CARCINOMA of the
LEFT breast, 3:00 o'clock. Immunohistochemistry for E-Cadherin is
positive consistent with ductal carcinoma. This was found to be
concordant by Dr. DANIEL HUGO.

Cytopathology revealed NO MALIGNANT CELLS IDENTIFIED of the LEFT
breast aspiration, 3:00 o'clock. This was found to be discordant by
Dr. DANIEL HUGO with continued workup and treatment per above
diagnosis.

Pathology revealed FIBROCYSTIC CHANGES WITH USUAL DUCTAL HYPERPLASIA
of the RIGHT breast, 9:30 o'clock. This was found to be concordant
by Dr. DANIEL HUGO, with stereotatic guided biopsy recommended.

Pathology results were discussed with the patient and her husband by
telephone on [DATE]. This was after multiple unsuccessful
attempts to contact the patient by the nurse navigator staff and
myself throughout the day and evening on [DATE]. The patient
reported doing well after the biopsies with tenderness BILATERALLY,
and drainage at the site of the aspiration in the LEFT breast. Post
biopsy instructions and care were reviewed and questions were
answered. The patient was encouraged to call The [REDACTED] of
number was provided.

Medical Oncology consultation has been arranged with Dr. DANIEL HUGO

The patient is scheduled for RIGHT breast stereotatic guided biopsy
be guided by the results of this biopsy.

NOTE: Ultrasound-guided biopsy of a LEFT axillary lymph node could
not be safely performed due to multiple large vessels coursing
around the lymph nodes and patient difficulty holding the arm in a
still upright position due to anxiety and pain.

Recommendation for a bilateral breast MRI to exclude any additional
sites of disease.

Pathology results reported by DANIEL HUGO, RN on [DATE].



Lesion quadrant: Upper outer quadrant

Using sterile technique, 1% lidocaine, under direct ultrasound
visualization, needle aspiration of the loculated components of the
mass was performed. Approximately 75 cc of serosanguineous and
bloody fluid was obtained and sent to cytology.

Using sterile technique and 1% Lidocaine as local anesthetic, under
direct ultrasound visualization, a 14 gauge DANIEL HUGO device was
used to perform biopsy of the solid components of the mass using a
lateral approach. At the conclusion of the procedure a ribbon shaped
tissue marker clip was deployed into the biopsy cavity. Follow up 2
view mammogram was deferred at this time.

Preprocedural scanning of the left axilla was performed. The
previously demonstrated, thickened lymph nodes are identified
adjacent to an surrounded by numerous large axillary vessels. The
patient had difficulty keeping her left arm in open position
secondary to pain and anxiety. A safe window for biopsy could not be
obtained. Therefore, biopsy of a left axillary lymph node was not
performed today.

Lesion quadrant: Upper outer quadrant

Using sterile technique and 1% Lidocaine as local anesthetic, under
direct ultrasound visualization, a 14 gauge DANIEL HUGO device was
used to perform biopsy of the mass at the [DATE] position of the right
breast using a lateral approach. At the conclusion of the procedure
a ribbon shaped tissue marker clip was deployed into the biopsy
cavity. Follow up 2 view mammogram was performed and dictated
separately
IMPRESSION: Ultrasound guided biopsy and aspiration of a left breast solid and
cystic mass. No apparent complications.

Ultrasound-guided biopsy of a left axillary lymph node could not be
safely performed due to multiple large vessels coursing around the
lymph nodes and patient difficulty holding the arm in a still
upright position due to anxiety and pain.

Ultrasound-guided biopsy of a right breast mass. No apparent
complications.

## 2021-05-06 IMAGING — MG MM BREAST LOCALIZATION CLIP
4 series · 4 of 12 positions shown · non-contrast
Comparison: Previous exam(s).

CLINICAL DATA: 50-year-old female status post bilateral breast
biopsies.

EXAM:
DIAGNOSTIC RIGHT MAMMOGRAM POST ULTRASOUND BIOPSY

[R CC synth-2D]
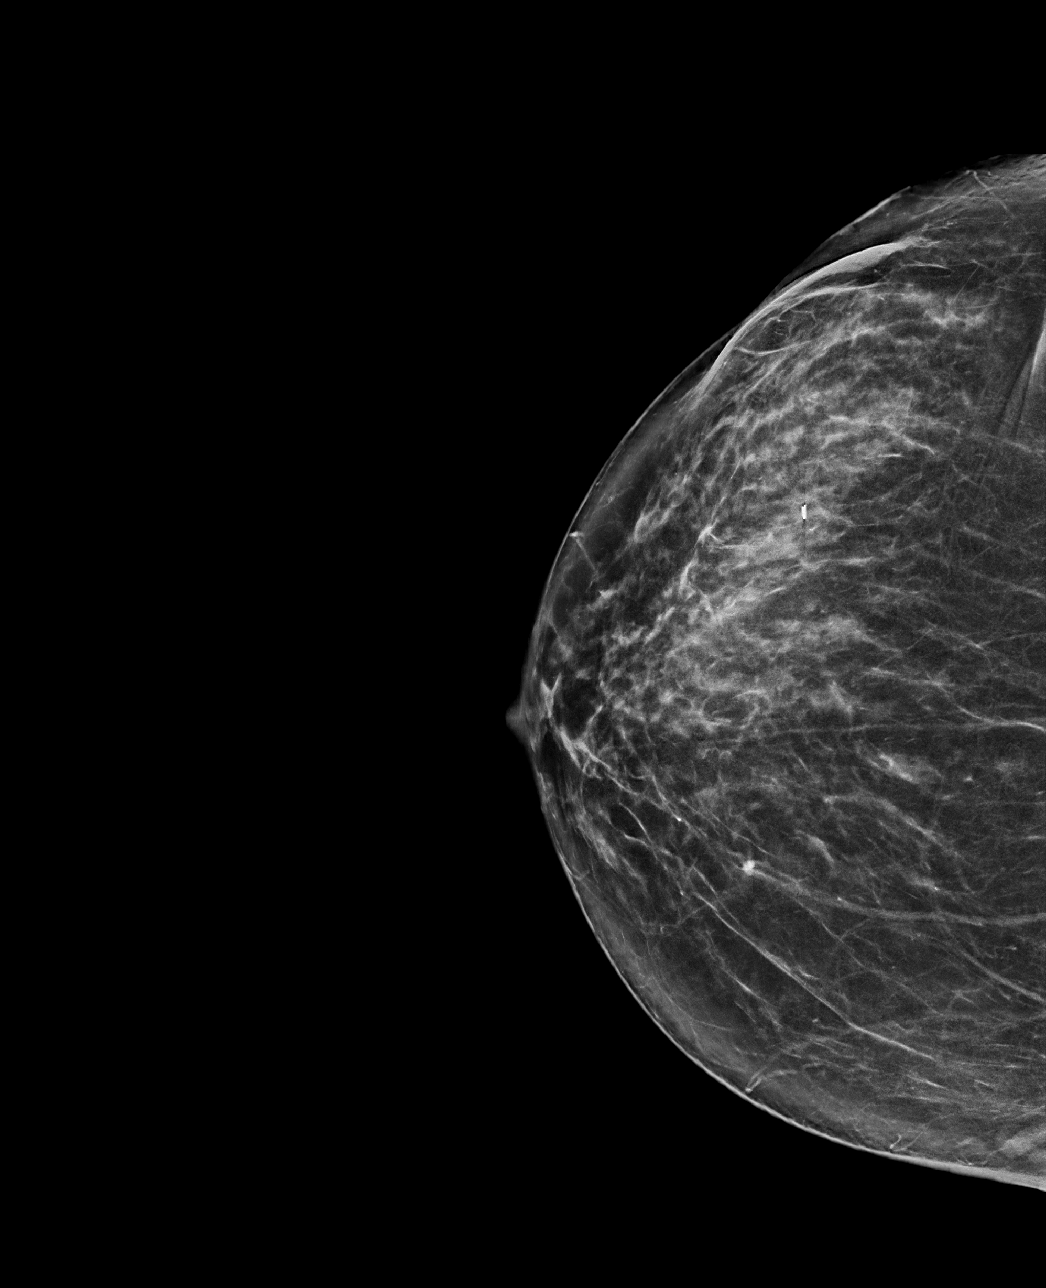

[R ML synth-2D]
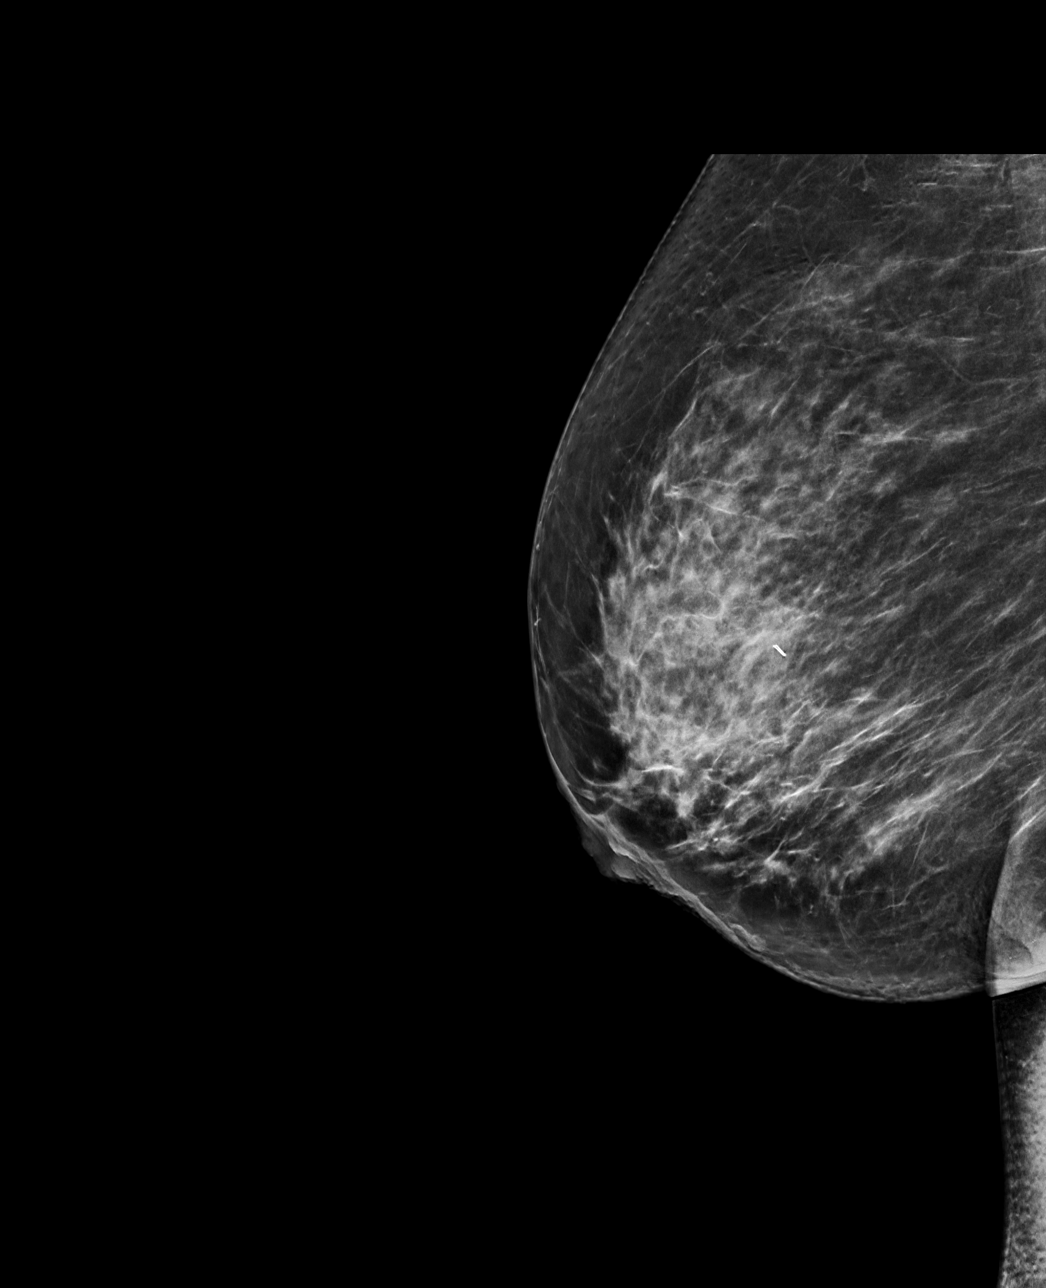

[R CC tomo · tomo slice 37/72.0]
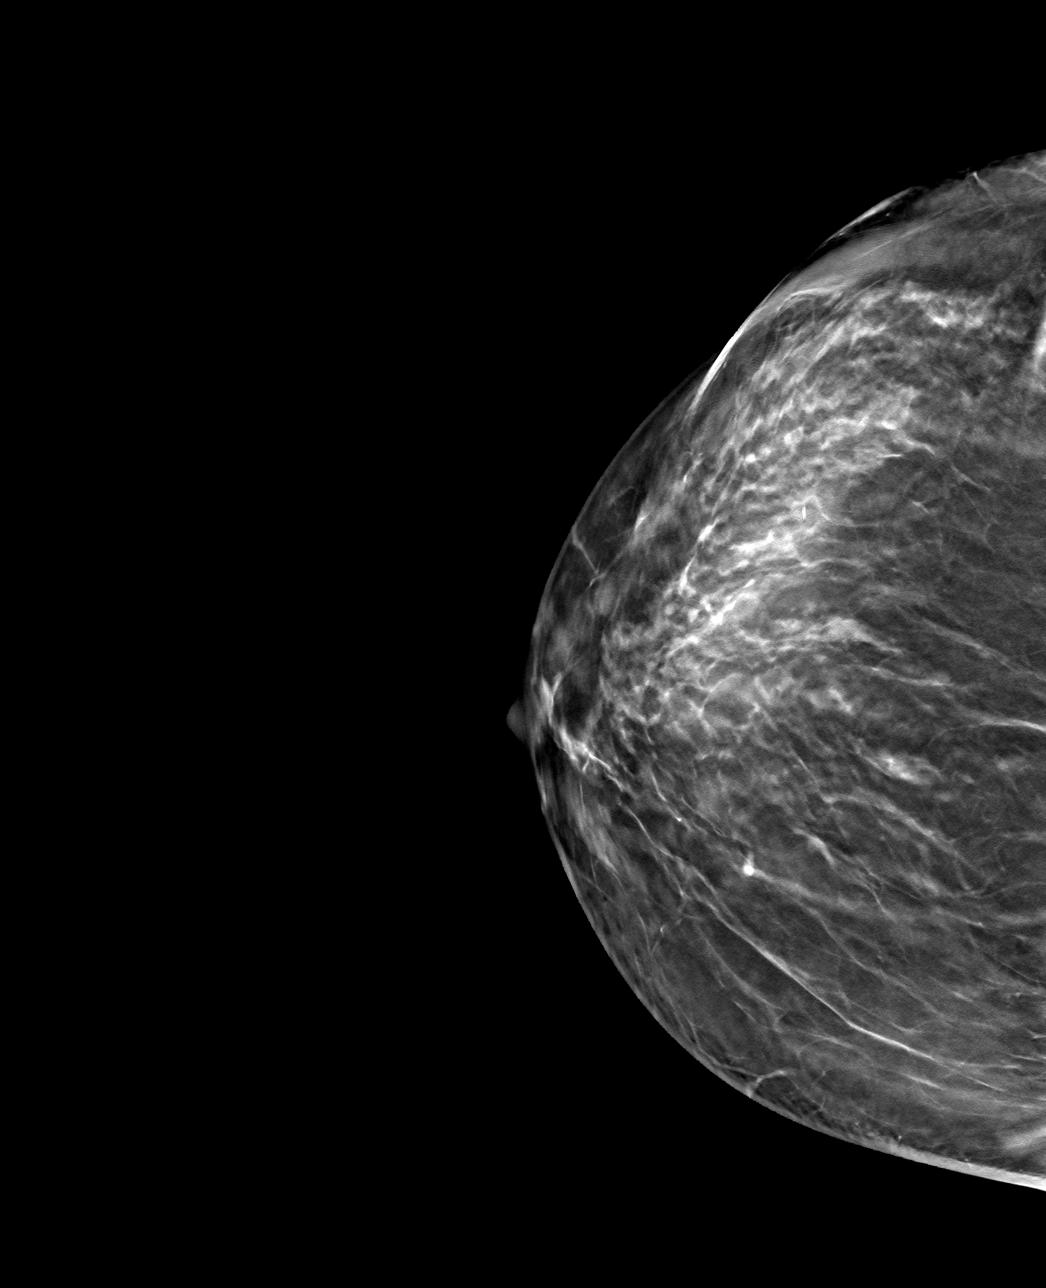

[R ML tomo · tomo slice 39/78.0]
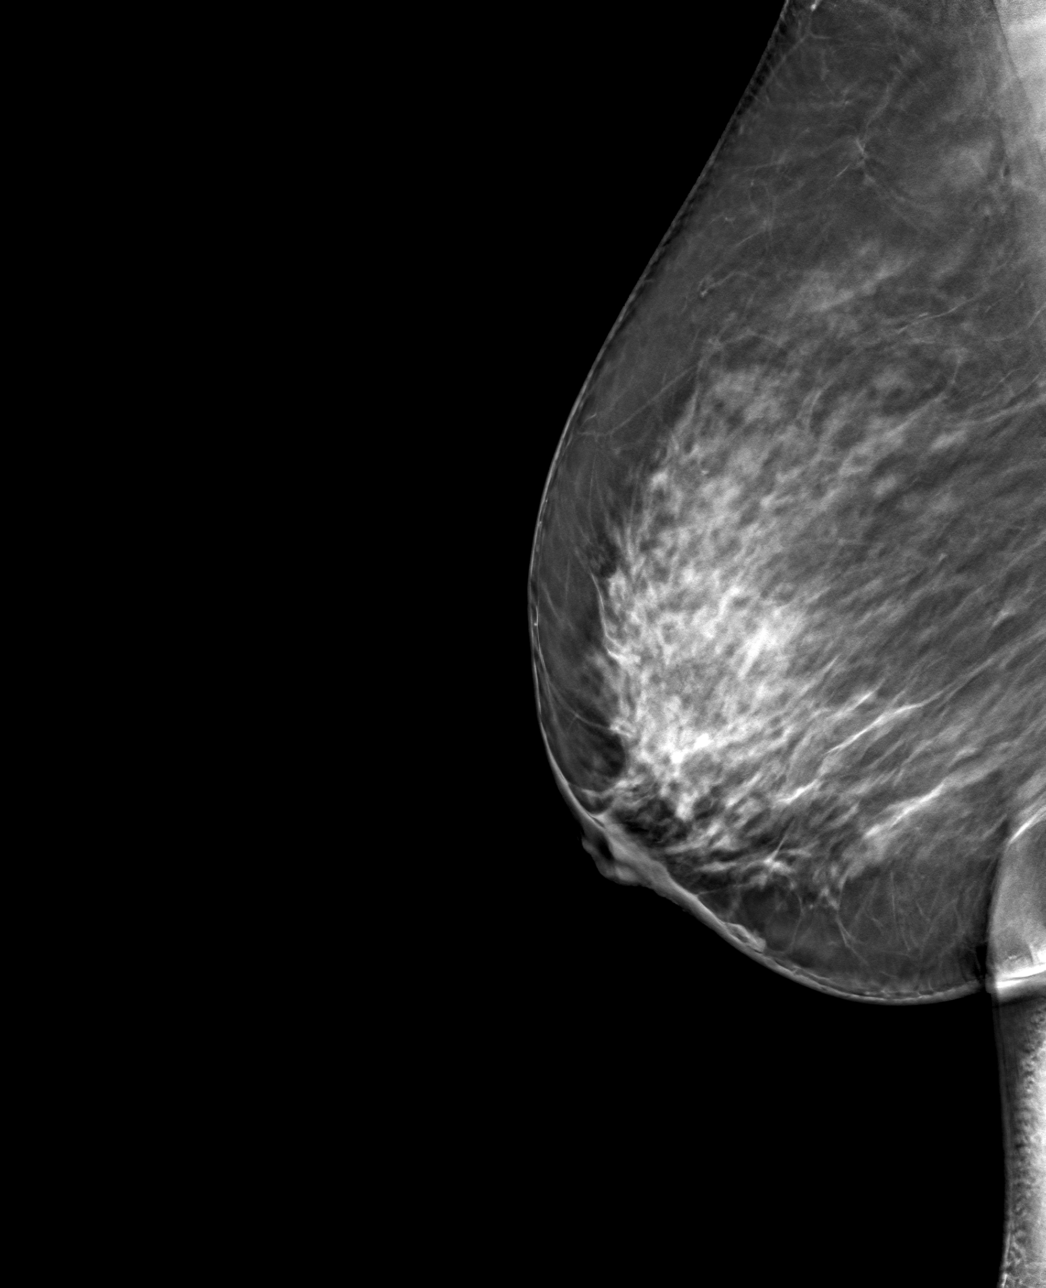

[4 of 12 positions shown; findings below may reference images not displayed]

FINDINGS: Mammographic images were obtained following ultrasound guided biopsy
of the right breast. The biopsy marking clip is in the upper outer
right breast at middle depth. This does not correspond with the
mammographically identified mass.
IMPRESSION: Post biopsy clip does not correspond with the location of the
mammographically identified mass in the right breast. Final
recommendation will be made after pathology becomes available.

Final Assessment: Post Procedure Mammograms for Marker Placement

## 2021-05-08 ENCOUNTER — Other Ambulatory Visit: Payer: Self-pay | Admitting: Emergency Medicine

## 2021-05-08 ENCOUNTER — Telehealth: Payer: Self-pay | Admitting: Hematology and Oncology

## 2021-05-08 ENCOUNTER — Encounter: Payer: Self-pay | Admitting: Hematology and Oncology

## 2021-05-08 DIAGNOSIS — R5381 Other malaise: Secondary | ICD-10-CM

## 2021-05-08 DIAGNOSIS — C50912 Malignant neoplasm of unspecified site of left female breast: Secondary | ICD-10-CM

## 2021-05-08 NOTE — Telephone Encounter (Signed)
Received a new pt referral from [facility] for a new dx of breast cancer. Sheena Mcdaniel has been scheduled to see Dr. Lindi Adie on 5/17 at 1pm. A letter has been mailed to the pt.

## 2021-05-11 ENCOUNTER — Ambulatory Visit
Admission: RE | Admit: 2021-05-11 | Discharge: 2021-05-11 | Disposition: A | Discharge status: Home / Self Care | Payer: 59 | Source: Ambulatory Visit | Attending: Emergency Medicine | Admitting: Emergency Medicine

## 2021-05-11 ENCOUNTER — Other Ambulatory Visit: Payer: Self-pay

## 2021-05-11 DIAGNOSIS — C50912 Malignant neoplasm of unspecified site of left female breast: Secondary | ICD-10-CM

## 2021-05-11 DIAGNOSIS — R5381 Other malaise: Secondary | ICD-10-CM

## 2021-05-11 IMAGING — MG MM BREAST BX W/ LOC DEV 1ST LESION IMAGE BX SPEC STEREO GUIDE*R*
8 of 10 series · 8 of 22 positions shown · non-contrast
Comparison: Previous exams.
COMPARISON: Previous exams.

Addendum:
CLINICAL DATA: Patient presents for stereotactic guided core biopsy
of RIGHT breast mass. Known LEFT breast malignancy. No

EXAM:
RIGHT BREAST STEREOTACTIC CORE NEEDLE BIOPSY

[R (1 of 5)]
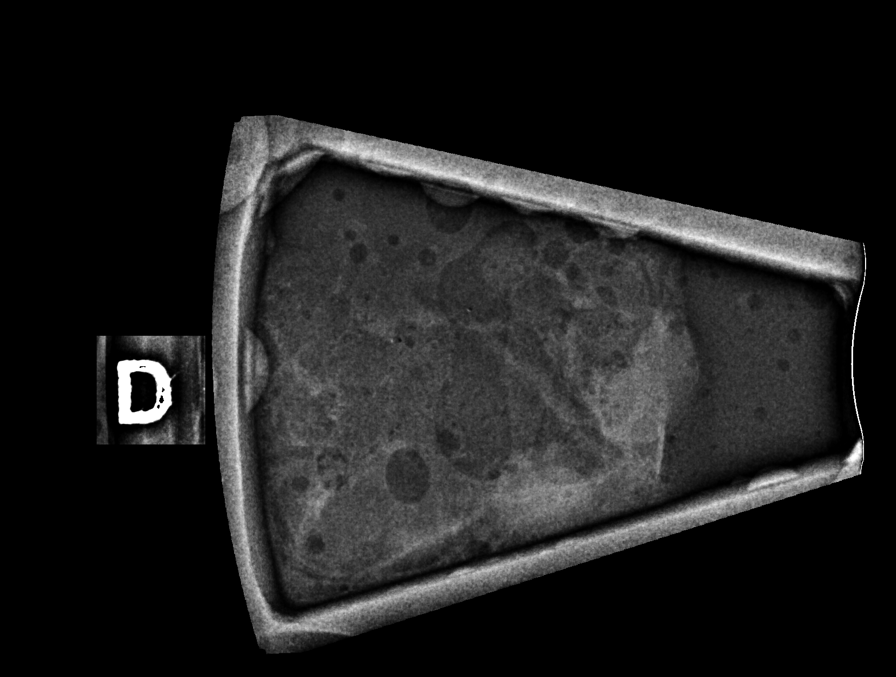

[R (2 of 5)]
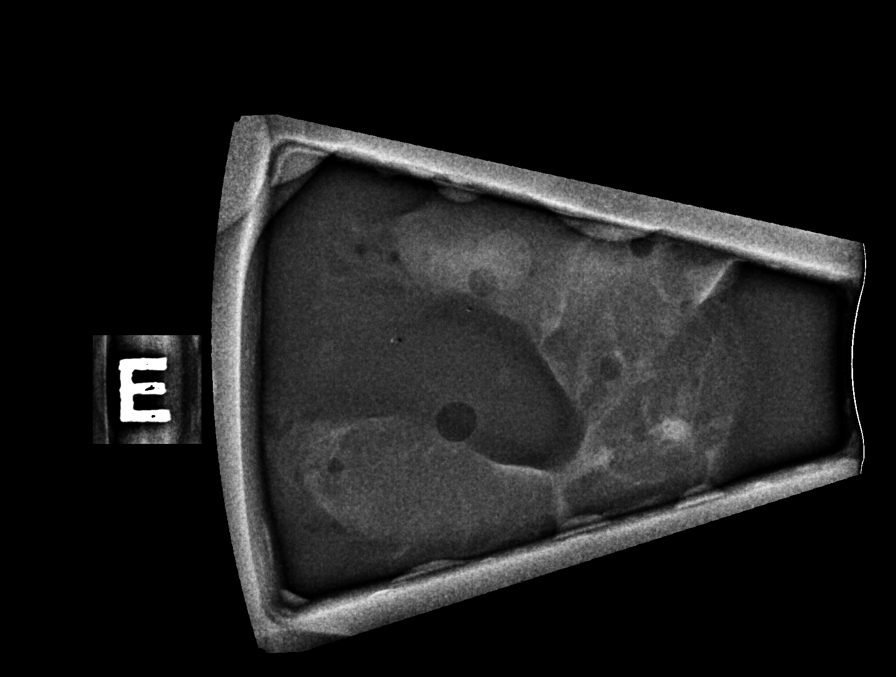

[R (3 of 5)]
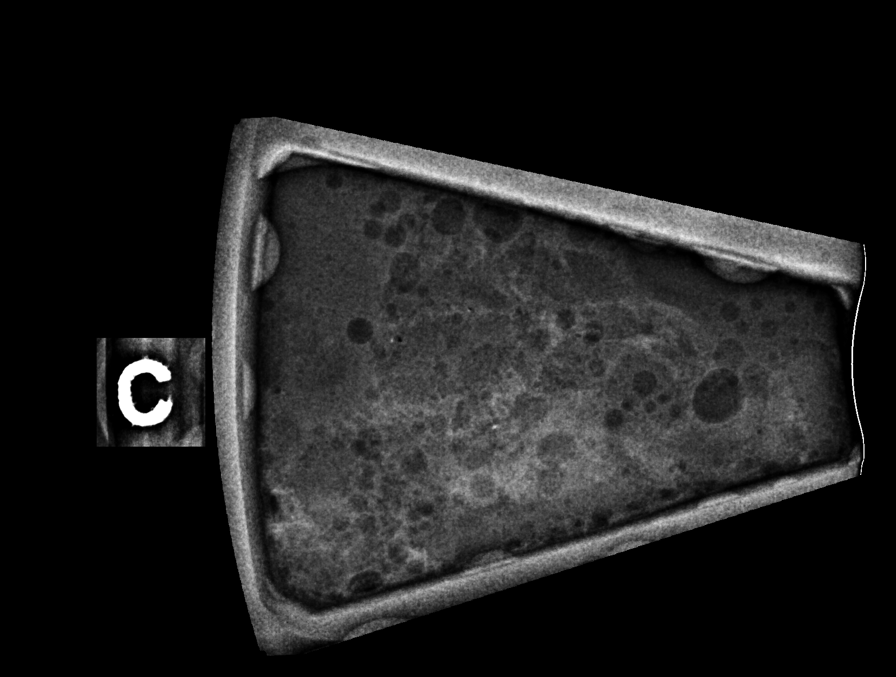

[R (4 of 5)]
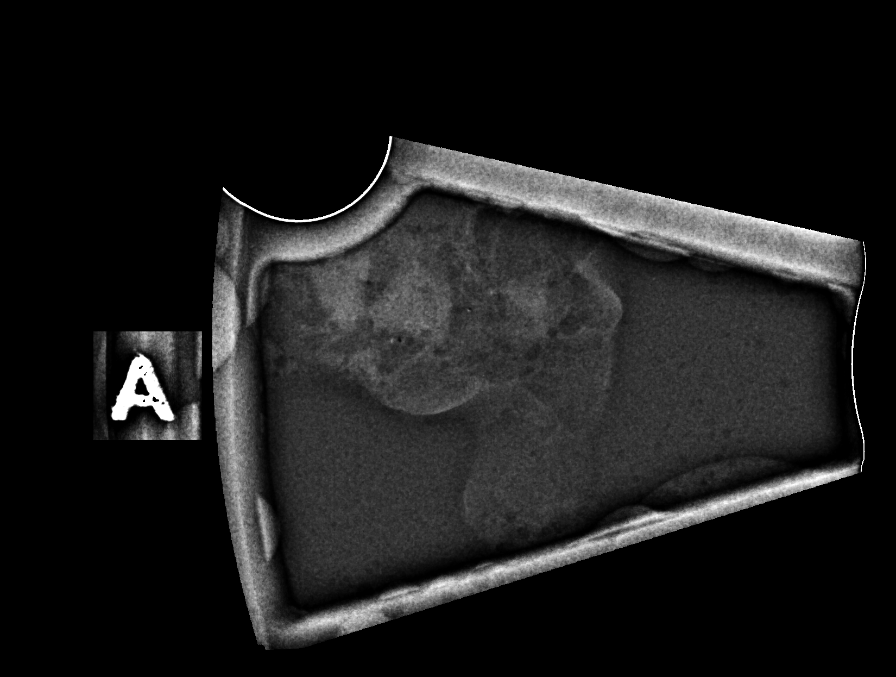

[R (5 of 5)]
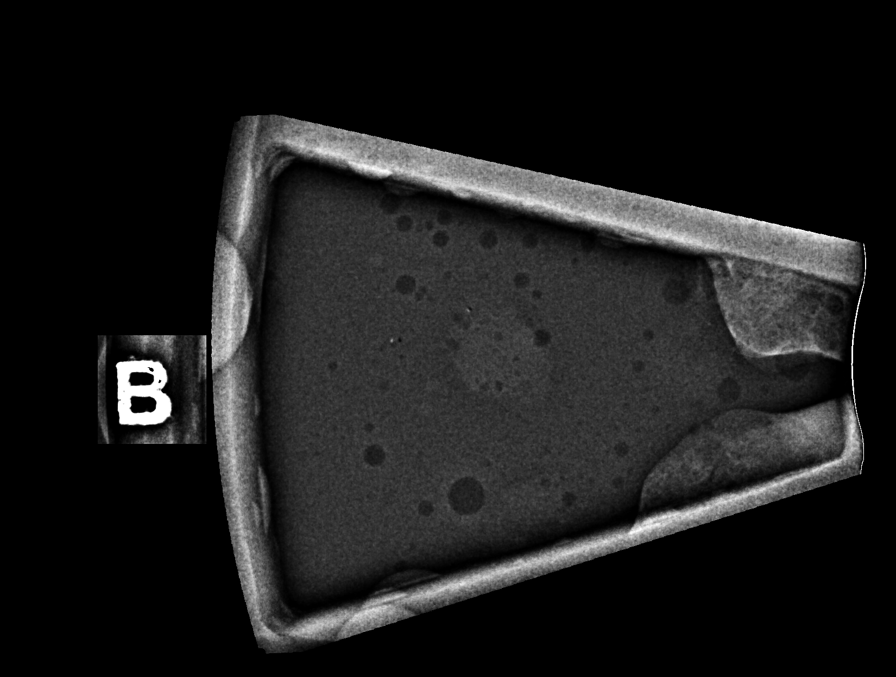

[R CC (1 of 2)]
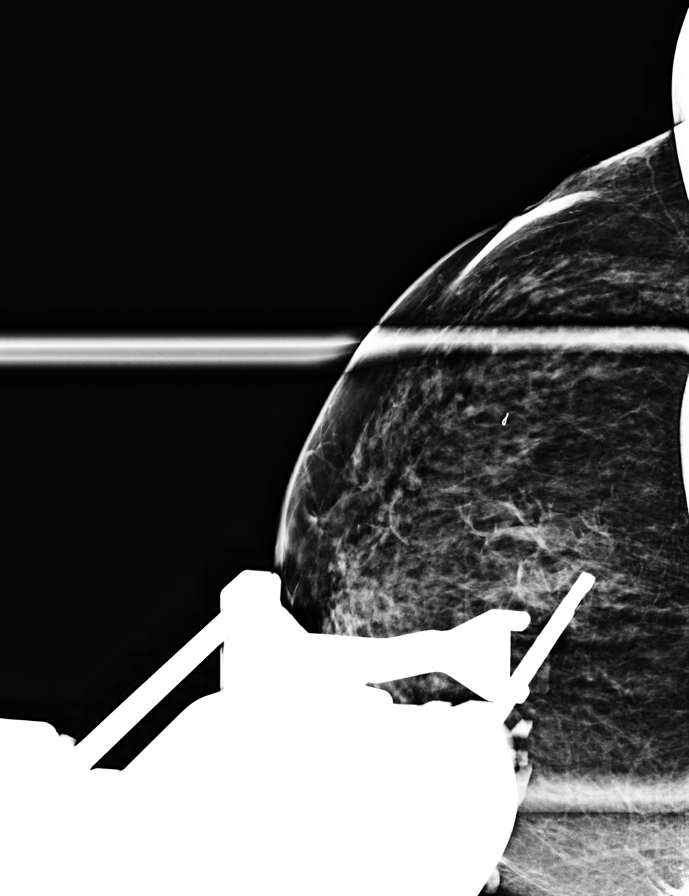

[R CC (2 of 2)]
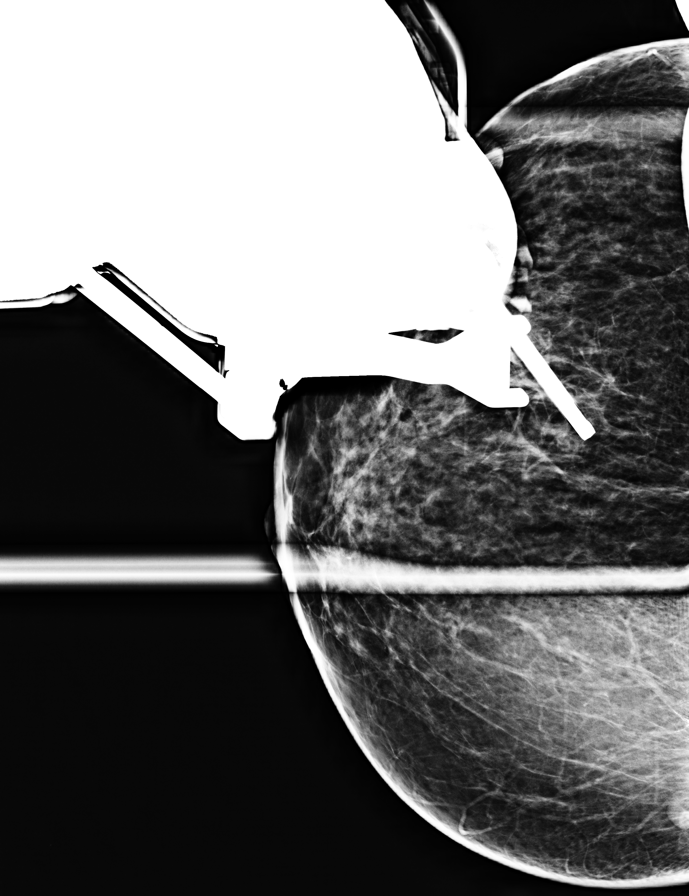

[R CC tomo · tomo slice 27/53.0]
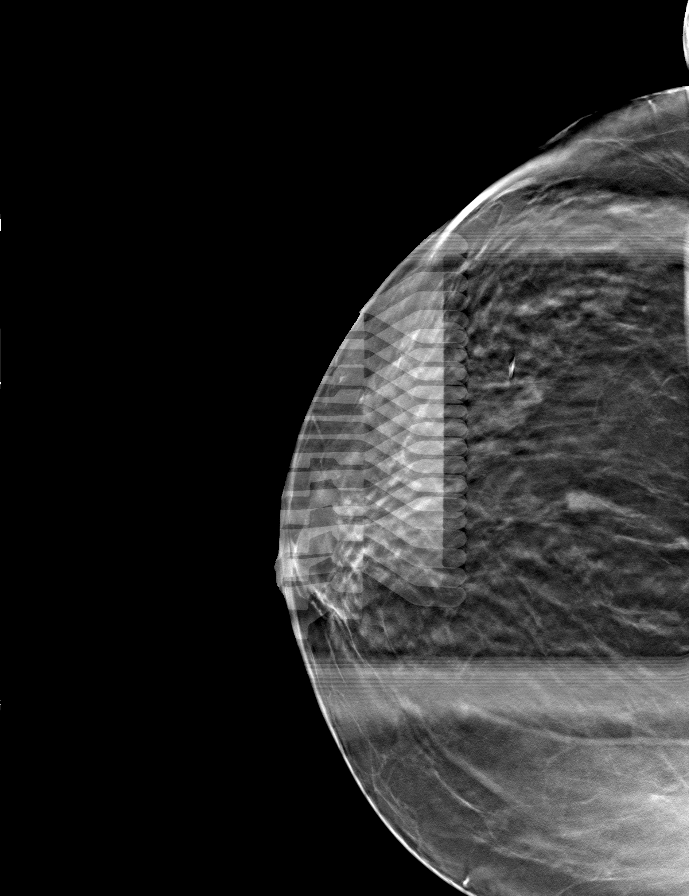

[8 of 22 positions shown; findings below may reference images not displayed]



Using sterile technique and 1% lidocaine and 1% lidocaine with
epinephrine as local anesthetic, under stereotactic guidance, a 9
gauge vacuum assisted device was used to perform core needle biopsy
of mass in the UPPER OUTER QUADRANT of the RIGHT breast using a
craniocaudal approach.

Lesion quadrant: UPPER-OUTER QUADRANT RIGHT breast

At the conclusion of the procedure, coil shaped tissue marker clip
was deployed into the biopsy cavity. Follow-up 2-view mammogram was
performed and dictated separately.
IMPRESSION: Stereotactic-guided biopsy of RIGHT breast mass. No apparent
complications.

ADDENDUM:
Pathology revealed FIBROADENOMA of the RIGHT breast, upper outer
quadrant, coil clip. This was found to be concordant by Dr.
ULLRIKKE.

Unable to reach patient this morning or afternoon to go over
pathology results. Voicemail left encouraging patient to call The

The patient has a recent diagnosis of LEFT breast cancer and should
follow her outlined treatment plan. Surgical consultation has been
arranged with Dr. ULLRIKKE at [REDACTED] on [DATE].

Pathology results reported by ULLRIKKE RN on [DATE].



Using sterile technique and 1% lidocaine and 1% lidocaine with
epinephrine as local anesthetic, under stereotactic guidance, a 9
gauge vacuum assisted device was used to perform core needle biopsy
of mass in the UPPER OUTER QUADRANT of the RIGHT breast using a
craniocaudal approach.

Lesion quadrant: UPPER-OUTER QUADRANT RIGHT breast

At the conclusion of the procedure, coil shaped tissue marker clip
was deployed into the biopsy cavity. Follow-up 2-view mammogram was
performed and dictated separately.
IMPRESSION: Stereotactic-guided biopsy of RIGHT breast mass. No apparent
complications.

## 2021-05-11 IMAGING — MG MM BREAST LOCALIZATION CLIP
4 series · 4 of 12 positions shown · non-contrast
Comparison: Previous exam(s).

CLINICAL DATA: Status post stereotactic guided core biopsy of RIGHT
breast mass.

EXAM:
DIAGNOSTIC RIGHT MAMMOGRAM POST STEREOTACTIC BIOPSY

[R ML synth-2D]
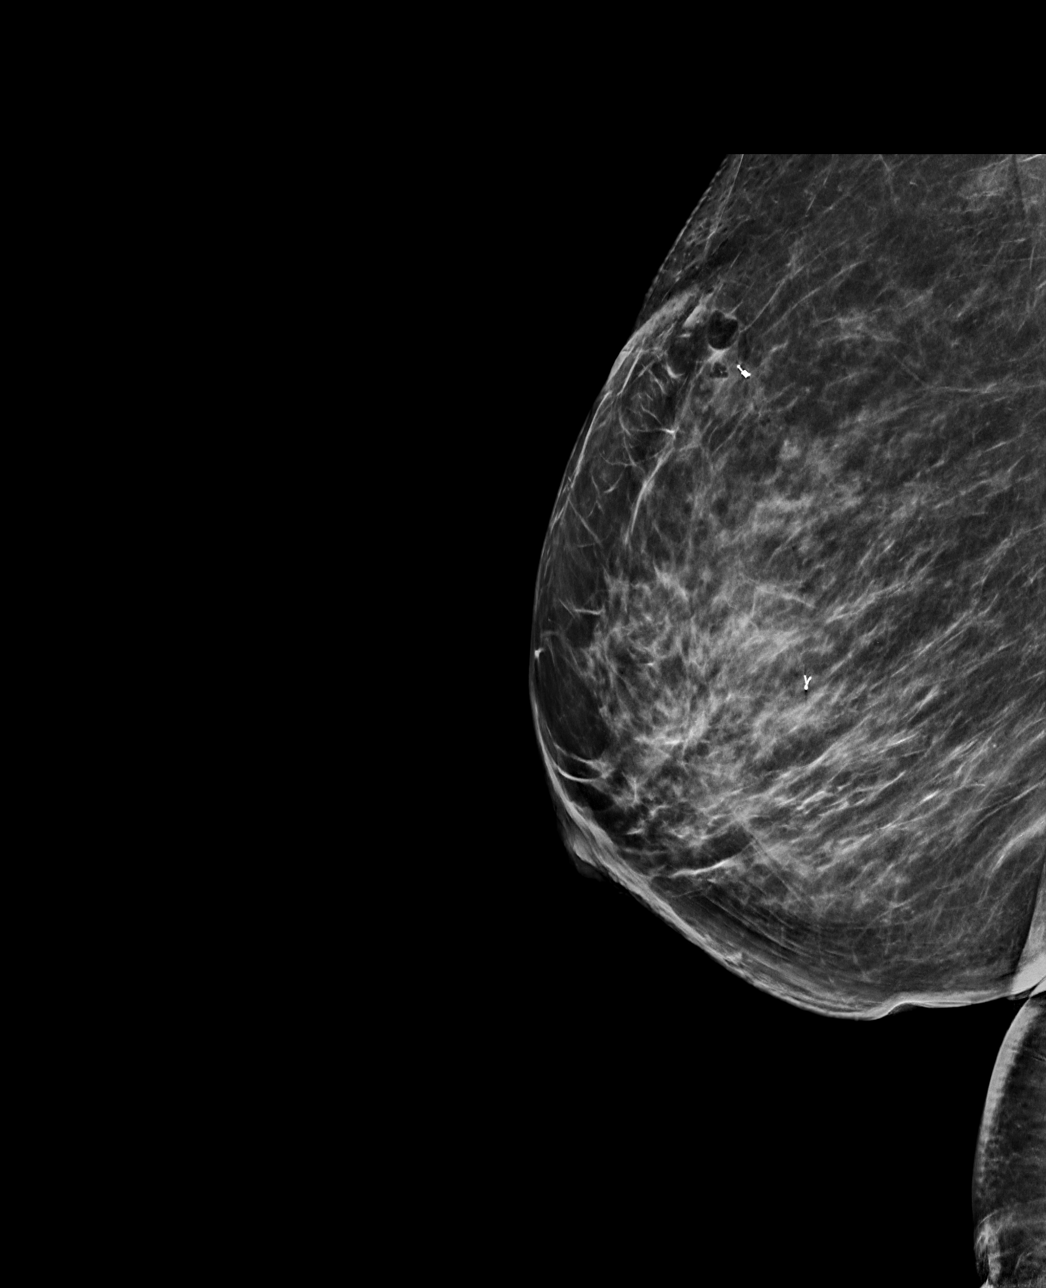

[R CC synth-2D]
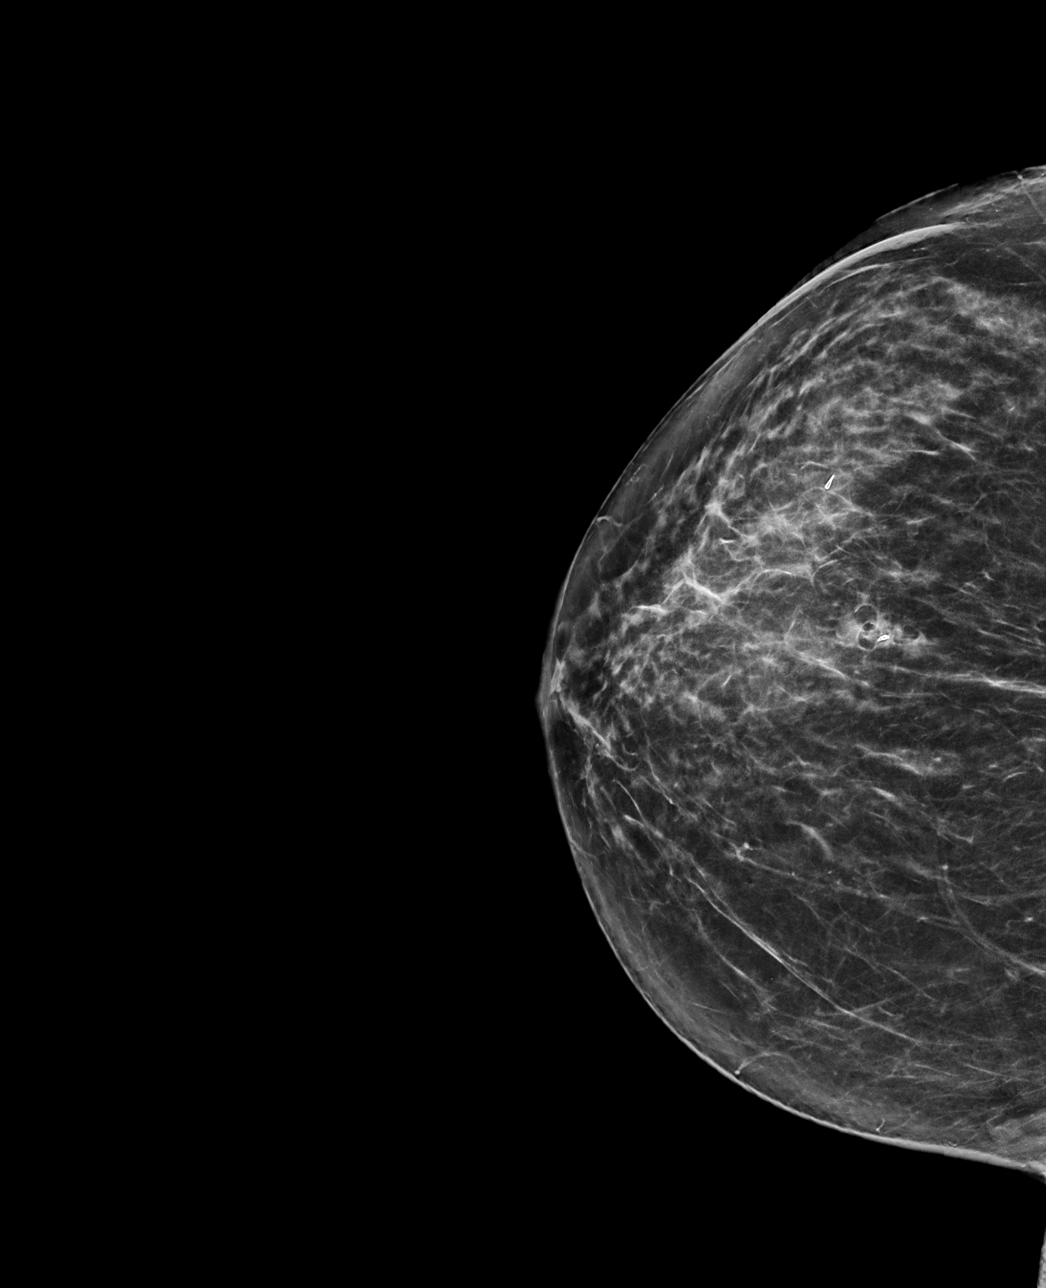

[R ML tomo · tomo slice 37/74.0]
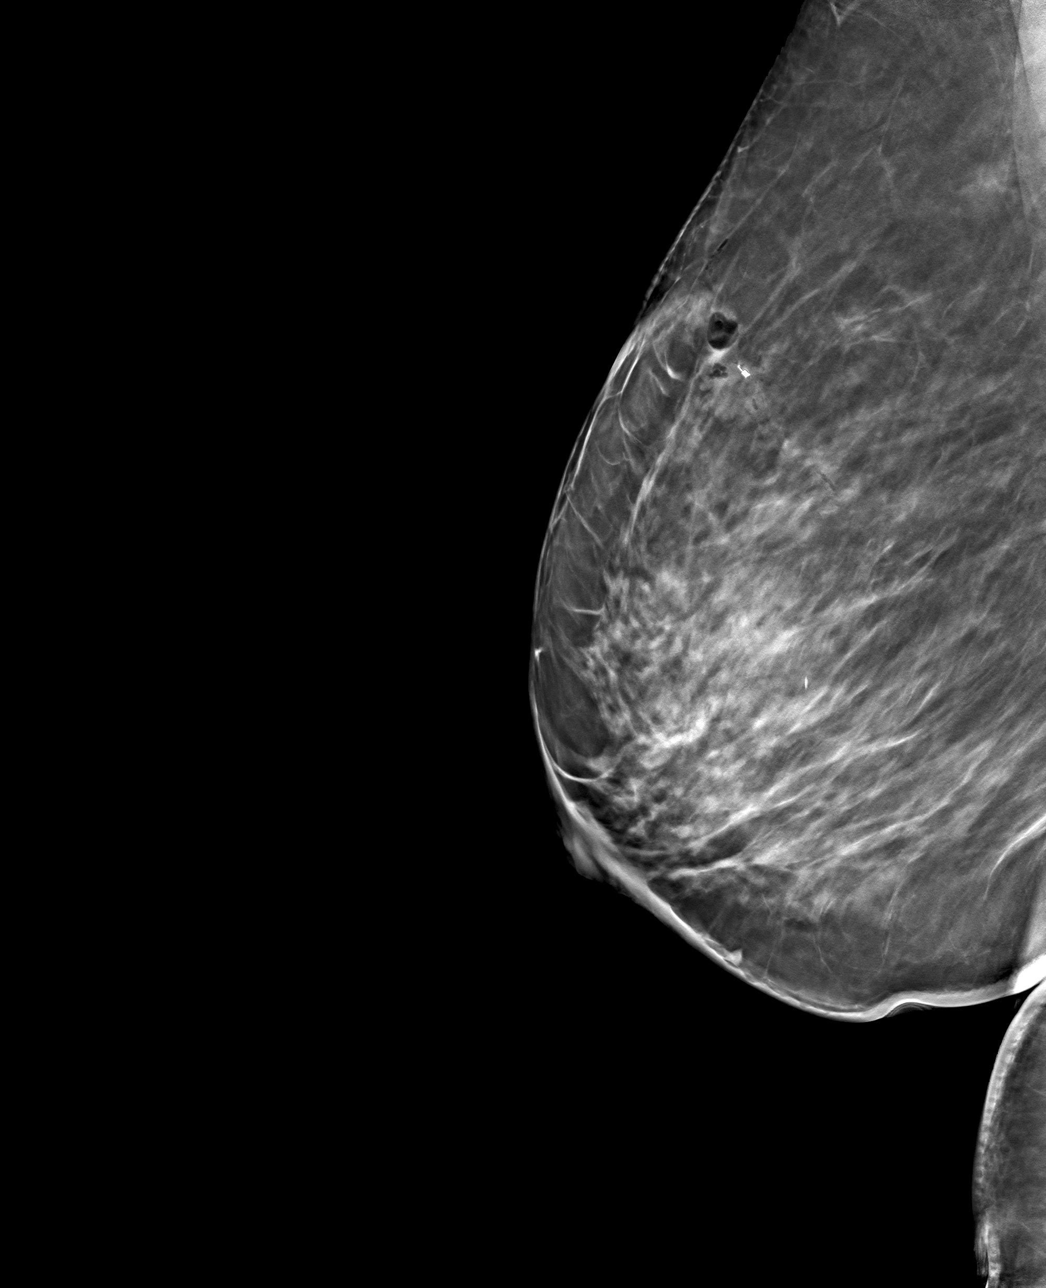

[R CC tomo · tomo slice 34/67.0]
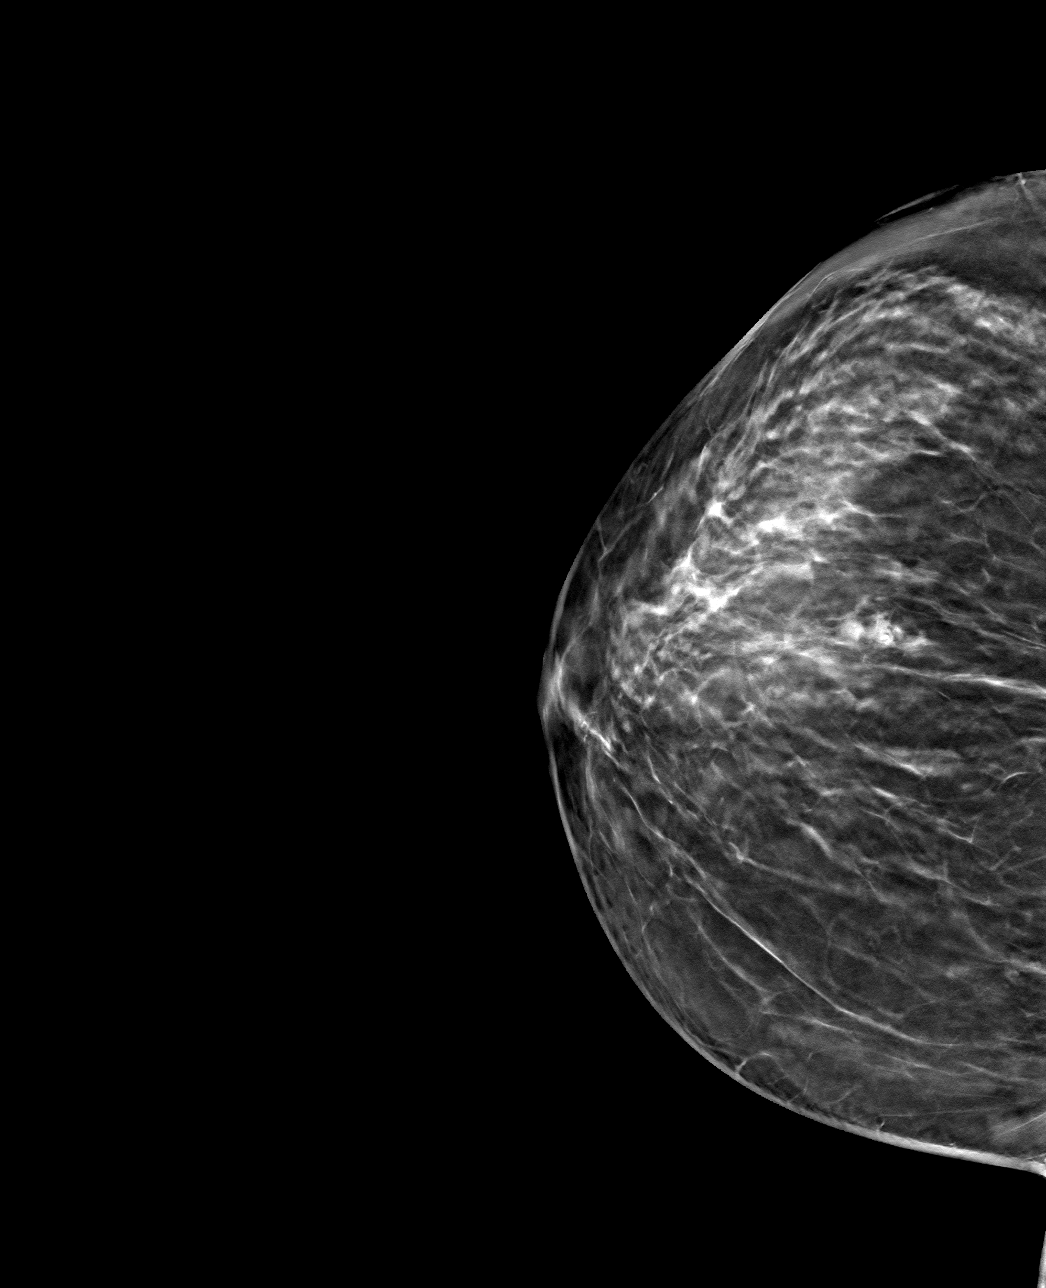

[4 of 12 positions shown; findings below may reference images not displayed]

FINDINGS: Mammographic images were obtained following stereotactic guided
biopsy of mass in the UPPER-OUTER QUADRANT of the RIGHT breast and
placement of a coil shaped clip. The biopsy marking clip is in
expected position at the site of biopsy. A ribbon shaped clip was
recently placed at the time of ultrasound-guided core biopsy of a
RIGHT breast mass which demonstrated fibrocystic changes.
IMPRESSION: Appropriate positioning of the coil shaped biopsy marking clip at
the site of biopsy in the UPPER-OUTER QUADRANT RIGHT breast.

Final Assessment: Post Procedure Mammograms for Marker Placement

## 2021-05-11 NOTE — Progress Notes (Signed)
[place] CONSULT NOTE  Patient Care Team: Rehman, Charlsie Quest, DO as PCP - General (Internal Medicine) Mauro Kaufmann, RN as Oncology Nurse Navigator Rockwell Germany, RN as Oncology Nurse Navigator  CHIEF COMPLAINTS/PURPOSE OF CONSULTATION:  Newly diagnosed breast cancer  HISTORY OF PRESENTING ILLNESS:  Sheena Mcdaniel 50 y.o. female is here because of recent diagnosis of invasive ductal carcinoma of the left breast. Patient palpated a painful and enlarging left breast lump for several months. Diagnostic mammogram and [place] on 05/04/21 showed a large, circumscribed multi-cystic mass in the left breast, 9-10cm, with multiple left axillary lymph nodes with cortical thickening, and a 0.4cm mass at the 9:30 position in the right breast. Biopsy on 05/06/21 showed no malignancy in the right breast, and in the left breast, invasive ductal carcinoma, grade 2, HER-2 negative (1+), ER+ 10% weak, PR-, Ki67 60%. She presents to the clinic today for initial evaluation and discussion of treatment options.   I reviewed her records extensively and collaborated the history with the patient.  SUMMARY OF ONCOLOGIC HISTORY: Oncology History  Malignant neoplasm of upper-outer quadrant of left breast in female, estrogen receptor positive ([place])  05/06/2021 Initial Diagnosis   Large circumscribed multicystic mass left breast 9 to 10 cm with multiple left axillary lymph nodes with cortical thickening: biopsy 3 o'clock position: Grade 2 IDC, ER 10%, PR 0%, Ki-67 60%, HER2 negative Right breast: 0.4 cm mass at 9:30 position: Benign on biopsy   05/12/2021 Cancer Staging   Staging form: Breast, AJCC 8th Edition - Clinical stage from 05/12/2021: Stage IIIB (cT3, cN1, cM0, G2, ER-, PR-, HER2-) - Signed by Nicholas Lose, MD on 05/12/2021 Stage prefix: Initial diagnosis Histologic grading system: 3 grade system     MEDICAL HISTORY:  No prior medical illnesses SURGICAL HISTORY: No Prior surgery SOCIAL  HISTORY: Social History   Socioeconomic History  . Marital status: Married    Spouse name: Not on file  . Number of children: Not on file  . Years of education: Not on file  . Highest education level: Not on file  Occupational History  . Not on file  Tobacco Use  . Smoking status: Former Research scientist (life sciences)  . Smokeless tobacco: Never Used  Substance and Sexual Activity  . Alcohol use: Yes  . Drug use: Never  . Sexual activity: Yes  Other Topics Concern  . Not on file  Social History Narrative  . Not on file   Social Determinants of Health   Financial Resource Strain: Not on file  Food Insecurity: Not on file  Transportation Needs: Not on file  Physical Activity: Not on file  Stress: Not on file  Social Connections: Not on file  Intimate Partner Violence: Not on file    FAMILY HISTORY: No family history on file.  ALLERGIES:  has No Known Allergies.  MEDICATIONS:  Current Outpatient Medications  Medication Sig Dispense Refill  . traZODone (DESYREL) 50 MG tablet Take 1 tablet (50 mg total) by mouth at bedtime. 30 tablet 1   No current facility-administered medications for this visit.    REVIEW OF SYSTEMS:   Constitutional: Denies fevers, chills or abnormal night sweats Eyes: Denies blurriness of vision, double vision or watery eyes Ears, nose, mouth, throat, and face: Denies mucositis or sore throat Respiratory: Denies cough, dyspnea or wheezes Cardiovascular: Denies palpitation, chest discomfort or lower extremity swelling Gastrointestinal:  Denies nausea, heartburn or change in bowel habits Skin: Denies abnormal skin rashes Lymphatics: Denies new lymphadenopathy or easy bruising  Neurological:Denies numbness, tingling or new weaknesses Behavioral/Psych: Mood is stable, no new changes  Breast: Palpable left breast mass All other systems were reviewed with the patient and are negative.  PHYSICAL EXAMINATION: ECOG PERFORMANCE STATUS: 2 - Symptomatic, <50% confined to  bed  Vitals:   05/12/21 1254  BP: (!) 156/85  Pulse: (!) 105  Resp: 20  Temp: 98.1 F (36.7 C)  SpO2: 100%   Filed Weights   05/12/21 1254  Weight: 155 lb 9.6 oz (70.6 kg)    GENERAL:alert, no distress and comfortable SKIN: skin color, texture, turgor are normal, no rashes or significant lesions EYES: normal, conjunctiva are pink and non-injected, sclera clear OROPHARYNX:no exudate, no erythema and lips, buccal mucosa, and tongue normal  NECK: supple, thyroid normal size, non-tender, without nodularity LYMPH:  no palpable lymphadenopathy in the cervical, axillary or inguinal LUNGS: clear to auscultation and percussion with normal breathing effort HEART: regular rate & rhythm and no murmurs and no lower extremity edema ABDOMEN:abdomen soft, non-tender and normal bowel sounds Musculoskeletal:no cyanosis of digits and no clubbing  PSYCH: alert & oriented x 3 with fluent speech NEURO: no focal motor/sensory deficits BREAST:Large left breast mass (exam performed in the presence of a chaperone)   LABORATORY DATA:  I have reviewed the data as listed No results found for: WBC, HGB, HCT, MCV, PLT No results found for: NA, K, CL, CO2  RADIOGRAPHIC STUDIES: I have personally reviewed the radiological reports and agreed with the findings in the report.  ASSESSMENT AND PLAN:  Malignant neoplasm of upper-outer quadrant of left breast in female, estrogen receptor positive ([place]) 05/06/2021: Large circumscribed multicystic mass left breast 9 to 10 cm with multiple left axillary lymph nodes with cortical thickening: biopsy 3 o'clock position: Grade 2 IDC, ER 10%, PR 0%, Ki-67 60%, HER2 negative Right breast: 0.4 cm mass at 9:30 position: Benign on biopsy  Pathology and radiology counseling: Discussed with the patient, the details of pathology including the type of breast cancer,the clinical staging, the significance of ER, PR and HER-2/neu receptors and the implications for treatment. After  reviewing the pathology in detail, we proceeded to discuss the different treatment options between surgery, radiation, chemotherapy, antiestrogen therapies.  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  Neoadjuvant chemotherapy with Adriamycin and Cytoxan along with pembrolizumab followed by Taxol carboplatin and pembrolizumab (pembrolizumab maintenance for 1 year) 2. mastectomy with targeted node dissection 3.  Adjuvant radiation 4.  Followed by adjuvant antiestrogen therapy (since she is 10% ER positive)  Chemotherapy Counseling: I discussed the risks and benefits of chemotherapy including the risks of nausea/ vomiting, risk of infection from low WBC count, fatigue due to chemo or anemia, bruising or bleeding due to low platelets, mouth sores, loss/ change in taste and decreased appetite. Liver and kidney function will be monitored through out chemotherapy as abnormalities in liver and kidney function and peripheral neuropathy may be side effects of treatment. Cardiac dysfunction due to Adriamycin  were discussed in detail.  Immunotherapy mediated immune adverse effects were also discussed.  Risk of permanent bone marrow dysfunction and leukemia due to chemo were also discussed.  Plan: 1.  Echocardiogram 2. port placement 3.  Chemo education 4.  Genetics 5.  Breast MRI  Return to clinic to start chemotherapy in 1 to 2 weeks Patient is extremely anxious and had several episodes of panic during the discusson   All questions were answered. The patient  knows to call the clinic with any problems, questions or concerns.   Rulon Eisenmenger, MD, MPH 05/12/2021    I, Molly Dorshimer, am acting as scribe for Nicholas Lose, MD.  I have reviewed the above documentation for accuracy and completeness, and I agree with the above.

## 2021-05-12 ENCOUNTER — Encounter: Payer: Self-pay | Admitting: *Deleted

## 2021-05-12 ENCOUNTER — Other Ambulatory Visit: Payer: Self-pay | Admitting: Internal Medicine

## 2021-05-12 ENCOUNTER — Inpatient Hospital Stay: Payer: 59 | Attending: Hematology and Oncology | Admitting: Hematology and Oncology

## 2021-05-12 ENCOUNTER — Telehealth: Payer: Self-pay | Admitting: *Deleted

## 2021-05-12 VITALS — BP 156/85 | HR 105 | Temp 98.1°F | Resp 20 | Ht 60.0 in | Wt 155.6 lb

## 2021-05-12 DIAGNOSIS — C801 Malignant (primary) neoplasm, unspecified: Secondary | ICD-10-CM

## 2021-05-12 DIAGNOSIS — F5102 Adjustment insomnia: Secondary | ICD-10-CM

## 2021-05-12 DIAGNOSIS — C50412 Malignant neoplasm of upper-outer quadrant of left female breast: Secondary | ICD-10-CM

## 2021-05-12 DIAGNOSIS — Z87891 Personal history of nicotine dependence: Secondary | ICD-10-CM | POA: Diagnosis not present

## 2021-05-12 DIAGNOSIS — Z79899 Other long term (current) drug therapy: Secondary | ICD-10-CM | POA: Diagnosis not present

## 2021-05-12 DIAGNOSIS — F411 Generalized anxiety disorder: Secondary | ICD-10-CM

## 2021-05-12 DIAGNOSIS — Z170 Estrogen receptor positive status [ER+]: Secondary | ICD-10-CM | POA: Insufficient documentation

## 2021-05-12 MED ORDER — TRAZODONE HCL 50 MG PO TABS: 50.0000 mg | ORAL_TABLET | Freq: Every day | ORAL | 1 refills | Status: DC

## 2021-05-12 NOTE — Telephone Encounter (Signed)
Left vm regarding navigation resources and contact information for question or needs.

## 2021-05-12 NOTE — Assessment & Plan Note (Signed)
05/06/2021: Large circumscribed multicystic mass left breast 9 to 10 cm with multiple left axillary lymph nodes with cortical thickening: biopsy 3 o'clock position: Grade 2 IDC, ER 10%, PR 0%, Ki-67 60%, HER2 negative Right breast: 0.4 cm mass at 9:30 position: Benign on biopsy  Pathology and radiology counseling: Discussed with the patient, the details of pathology including the type of breast cancer,the clinical staging, the significance of ER, PR and HER-2/neu receptors and the implications for treatment. After reviewing the pathology in detail, we proceeded to discuss the different treatment options between surgery, radiation, chemotherapy, antiestrogen therapies.  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  Neoadjuvant chemotherapy with Adriamycin and Cytoxan along with pembrolizumab followed by Taxol carboplatin and pembrolizumab (pembrolizumab maintenance for 1 year) 2. mastectomy with targeted node dissection 3.  Adjuvant radiation 4.  Followed by adjuvant antiestrogen therapy (since she is 10% ER positive)  Chemotherapy Counseling: I discussed the risks and benefits of chemotherapy including the risks of nausea/ vomiting, risk of infection from low WBC count, fatigue due to chemo or anemia, bruising or bleeding due to low platelets, mouth sores, loss/ change in taste and decreased appetite. Liver and kidney function will be monitored through out chemotherapy as abnormalities in liver and kidney function and peripheral neuropathy may be side effects of treatment. Cardiac dysfunction due to Adriamycin  were discussed in detail.  Immunotherapy mediated immune adverse effects were also discussed.  Risk of permanent bone marrow dysfunction and leukemia due to chemo were also discussed.  Plan: 1.  Echocardiogram 2. port placement 3.  Chemo education 4. [place] [address]: Treatment of refractory nausea.  After first cycle of chemo if  patient experience chemo induced nausea and vomiting the randomized from cycle 2 to Aloxi plus Dex plus olanzapine or placebo plus Compazine or placebo plus placebo prior to chemo and take home medications for day 2 today for of Dex plus olanzapine or placebo and Compazine or placebo every 8 hours.  If patient does not have nausea after cycle 1, then the trial is complete. 5.  Genetics 6.  Breast MRI  Return to clinic to start chemotherapy in 1 to 2 weeks

## 2021-05-12 NOTE — Progress Notes (Signed)
START ON PATHWAY REGIMEN - Breast     Cycles 1 through 4: A cycle is every 21 days:     Pembrolizumab      Paclitaxel      Carboplatin      Filgrastim-xxxx    Cycles 5 through 8: A cycle is every 21 days:     Pembrolizumab      Doxorubicin      Cyclophosphamide      Pegfilgrastim-xxxx   **Always confirm dose/schedule in your pharmacy ordering system**  Patient Characteristics: Preoperative or Nonsurgical Candidate (Clinical Staging), Neoadjuvant Therapy followed by Surgery, Invasive Disease, Chemotherapy, HER2 Negative/Unknown/Equivocal, ER Negative/Unknown, Platinum Therapy Indicated, High-Risk Disease Present Therapeutic Status: Preoperative or Nonsurgical Candidate (Clinical Staging) AJCC M Category: cM0 AJCC Grade: G2 Breast Surgical Plan: Neoadjuvant Therapy followed by Surgery ER Status: Negative (-) AJCC 8 Stage Grouping: IIIC HER2 Status: Negative (-) AJCC T Category: cT4b AJCC N Category: cN1 PR Status: Negative (-) Type of Therapy: Platinum Therapy Indicated Intent of Therapy: Curative Intent, Discussed with Patient

## 2021-05-13 ENCOUNTER — Encounter: Payer: Self-pay | Admitting: *Deleted

## 2021-05-13 MED ORDER — LORAZEPAM 0.5 MG PO TABS: 0.5000 mg | ORAL_TABLET | Freq: Every evening | ORAL | 0 refills | Status: DC | PRN

## 2021-05-13 MED ORDER — ONDANSETRON HCL 8 MG PO TABS: 8.0000 mg | ORAL_TABLET | Freq: Two times a day (BID) | ORAL | 1 refills | Status: DC | PRN

## 2021-05-13 MED ORDER — DEXAMETHASONE 4 MG PO TABS: ORAL_TABLET | ORAL | 0 refills | Status: DC

## 2021-05-13 MED ORDER — PROCHLORPERAZINE MALEATE 10 MG PO TABS: 10.0000 mg | ORAL_TABLET | Freq: Four times a day (QID) | ORAL | 1 refills | Status: DC | PRN

## 2021-05-13 MED ORDER — LIDOCAINE-PRILOCAINE 2.5-2.5 % EX CREA: TOPICAL_CREAM | CUTANEOUS | 3 refills | Status: DC

## 2021-05-15 ENCOUNTER — Other Ambulatory Visit: Payer: Self-pay

## 2021-05-15 ENCOUNTER — Encounter (HOSPITAL_BASED_OUTPATIENT_CLINIC_OR_DEPARTMENT_OTHER): Payer: Self-pay | Admitting: General Surgery

## 2021-05-19 ENCOUNTER — Ambulatory Visit (HOSPITAL_COMMUNITY)
Admission: RE | Admit: 2021-05-19 | Discharge: 2021-05-19 | Disposition: A | Discharge status: Home / Self Care | Payer: 59 | Source: Ambulatory Visit | Attending: Hematology and Oncology | Admitting: Hematology and Oncology

## 2021-05-19 ENCOUNTER — Telehealth: Payer: Self-pay | Admitting: Licensed Clinical Social Worker

## 2021-05-19 ENCOUNTER — Other Ambulatory Visit: Payer: Self-pay

## 2021-05-19 ENCOUNTER — Encounter (HOSPITAL_COMMUNITY)
Admission: RE | Admit: 2021-05-19 | Discharge: 2021-05-19 | Disposition: A | Discharge status: Home / Self Care | Payer: 59 | Source: Ambulatory Visit | Attending: Hematology and Oncology | Admitting: Hematology and Oncology

## 2021-05-19 ENCOUNTER — Telehealth: Payer: Self-pay | Admitting: Hematology and Oncology

## 2021-05-19 DIAGNOSIS — C50412 Malignant neoplasm of upper-outer quadrant of left female breast: Secondary | ICD-10-CM | POA: Insufficient documentation

## 2021-05-19 DIAGNOSIS — Z170 Estrogen receptor positive status [ER+]: Secondary | ICD-10-CM | POA: Insufficient documentation

## 2021-05-19 IMAGING — NM NM BONE WHOLE BODY
2 series · 2 of 2 positions shown · non-contrast
Comparison: CT chest, abdomen and pelvis [DATE]

CLINICAL DATA: Breast cancer staging.

EXAM:
NUCLEAR MEDICINE WHOLE BODY BONE SCAN
TECHNIQUE: Whole body anterior and posterior images were obtained approximately
3 hours after intravenous injection of radiopharmaceutical.
RADIOPHARMACEUTICALS:  19.7 mCi [X2] MDP IV

[Series 1: whole body · 2.66mm/px · 1 of 1 slices shown (1 of 2)]
[im 1/1]
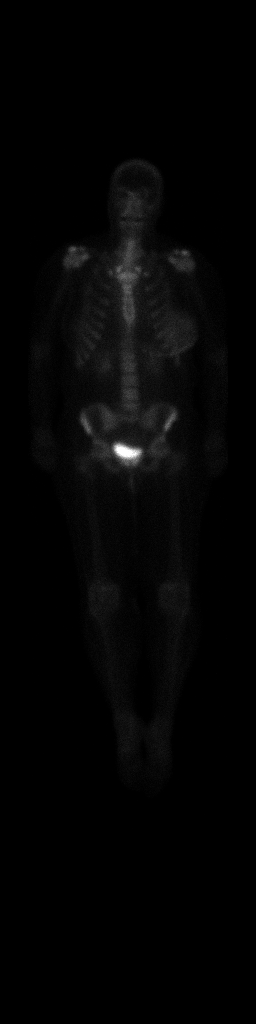

[Series 1: whole body · 2.66mm/px · 1 of 1 slices shown (2 of 2)]
[im 1/1]
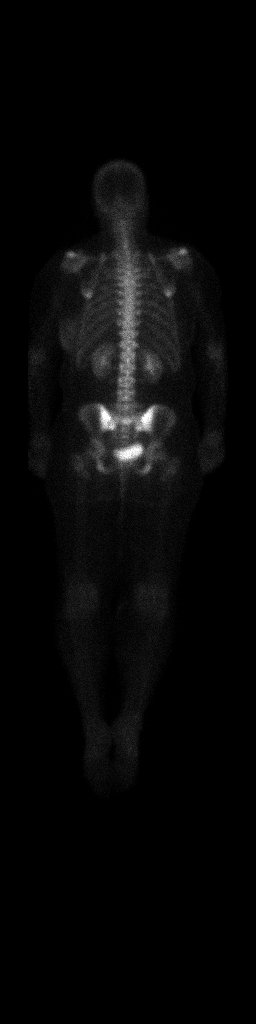

[2 of 2 positions shown; findings below may reference images not displayed]

FINDINGS: There is no abnormal osseous uptake to suggest bone metastases.
Asymmetric soft tissue uptake within the left breast reflecting
tumoral uptake. Physiologic uptake is seen within the kidneys an
urinary bladder.
IMPRESSION: 1. No signs of osseous metastasis.

## 2021-05-19 IMAGING — CT CT ABD-PELV W/O CM
2 of 4 series · 14 of 36 positions shown, 17 images · non-contrast
Comparison: None.

CLINICAL DATA: Left breast carcinoma.  Staging.

EXAM:
CT CHEST, ABDOMEN AND PELVIS WITHOUT CONTRAST
TECHNIQUE: Multidetector CT imaging of the chest, abdomen and pelvis was
performed following the standard protocol without IV contrast.

[Series 2: cap w/o · axial · non-contrast · 0.94mm/px · z∈[+1024,+1574]mm · 11 of 132 slices shown, 14 images]
[im 11/132  mediastinal]
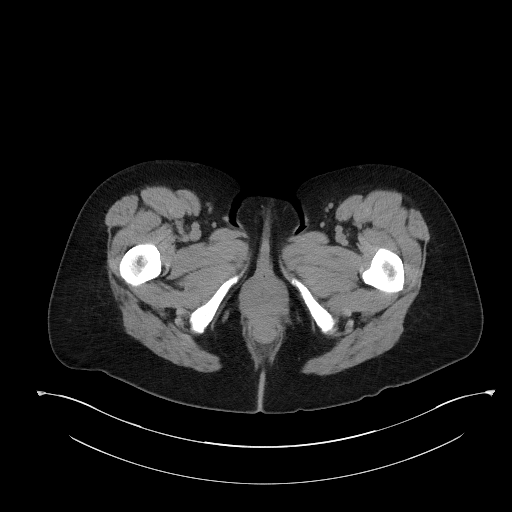
[im 11/132  lung]
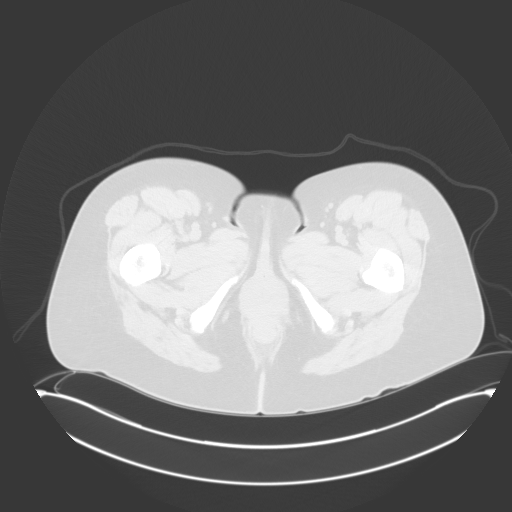
[im 22/132  lung]
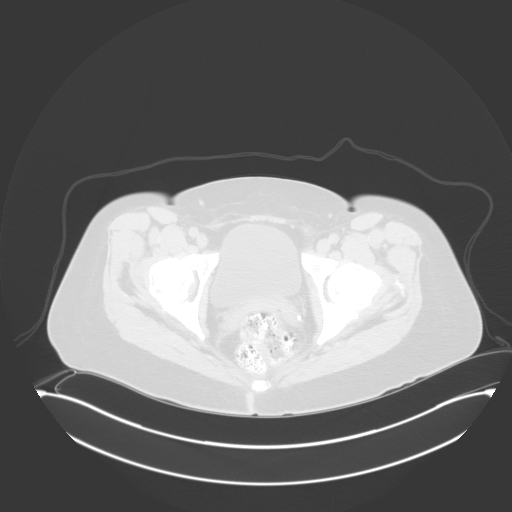
[im 33/132  lung]
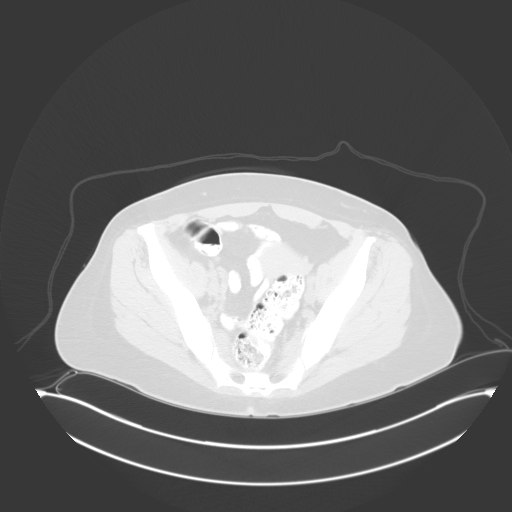
[im 44/132  lung]
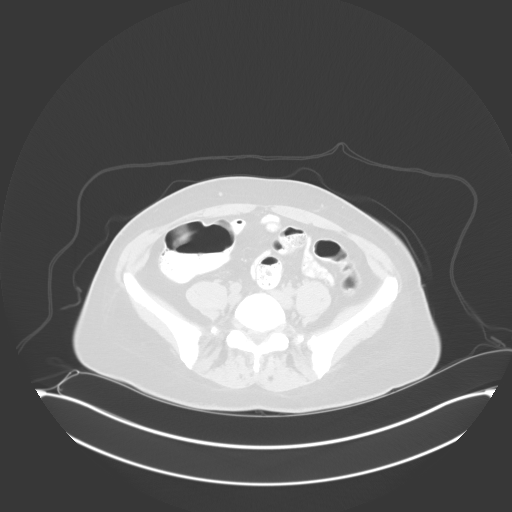
[im 55/132  mediastinal]
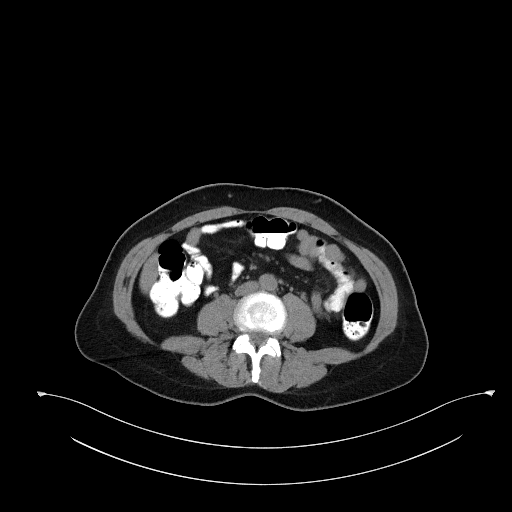
[im 55/132  lung]
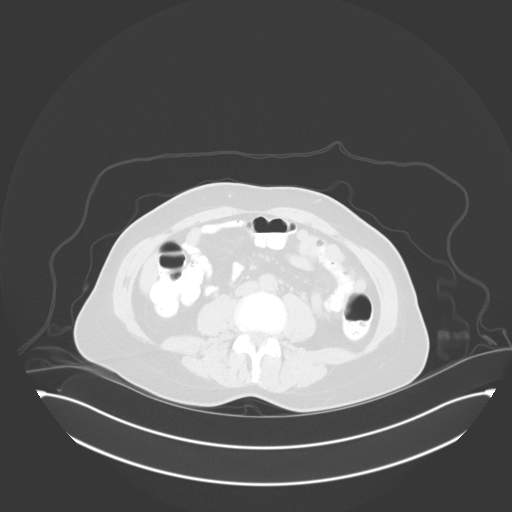
[im 66/132  lung]
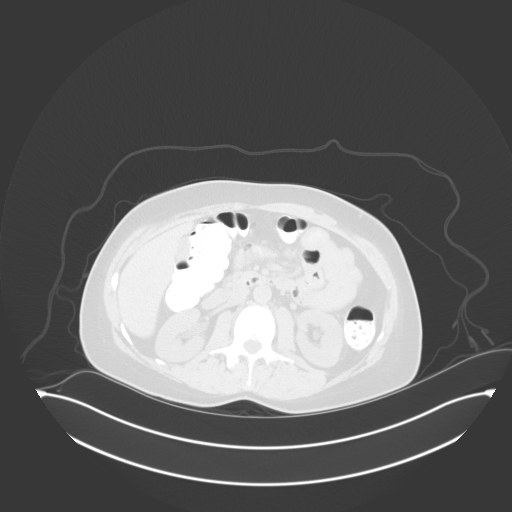
[im 77/132  lung]
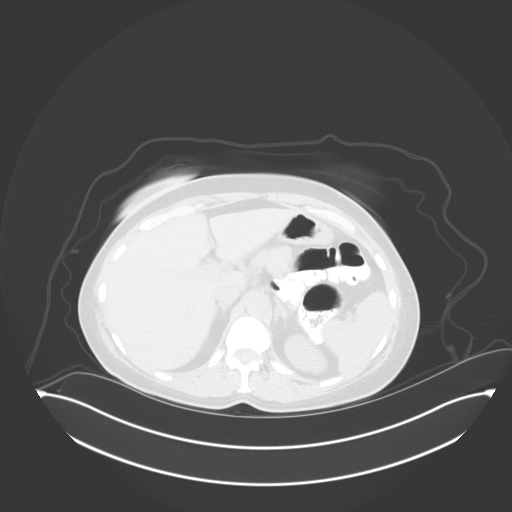
[im 88/132  lung]
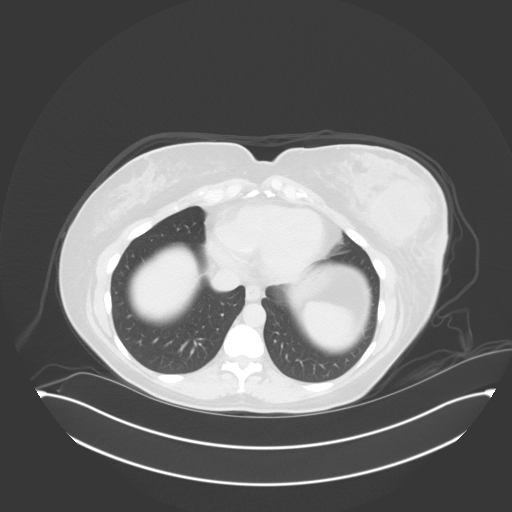
[im 99/132  mediastinal]
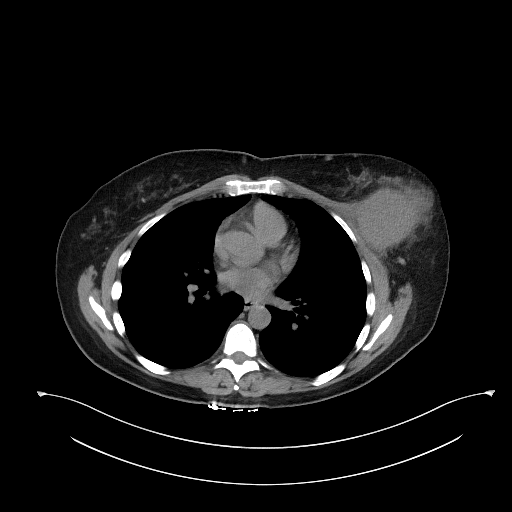
[im 99/132  lung]
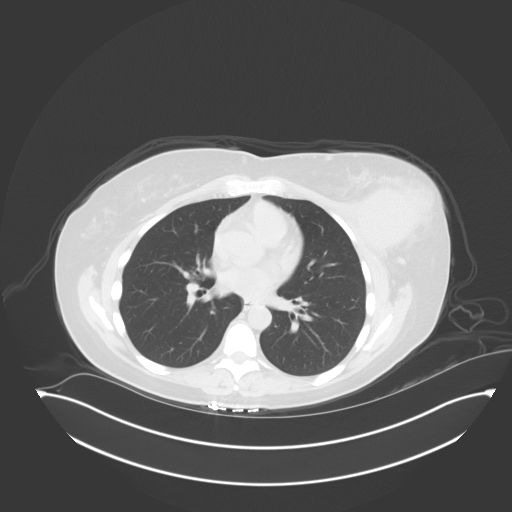
[im 110/132  lung]
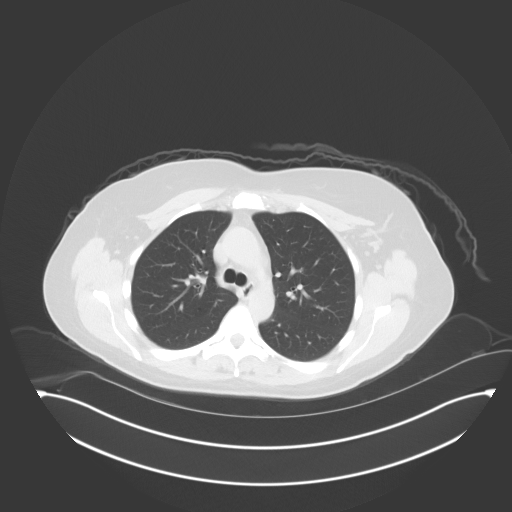
[im 121/132  lung]
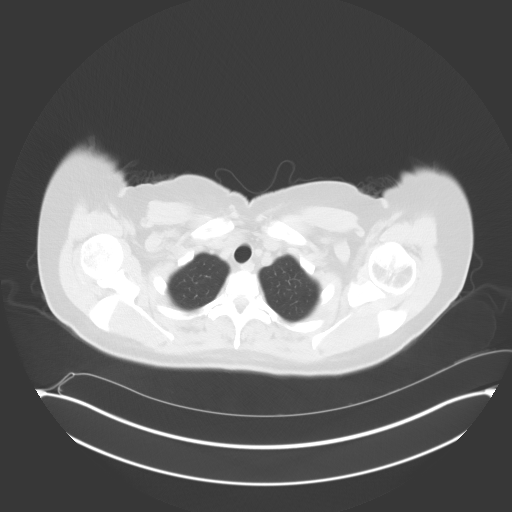

[Series 5: coronals · coronal · 0.82mm/px · 3 of 165 slices shown]
[im 33/165  lung]
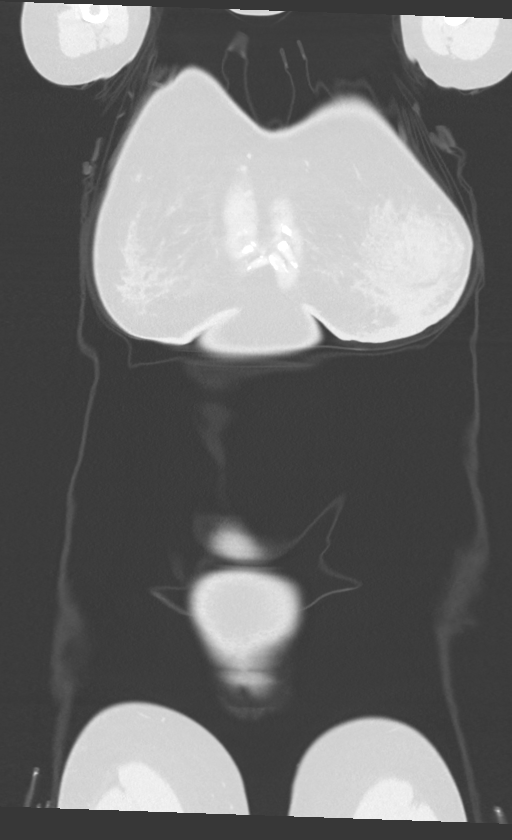
[im 66/165  lung]
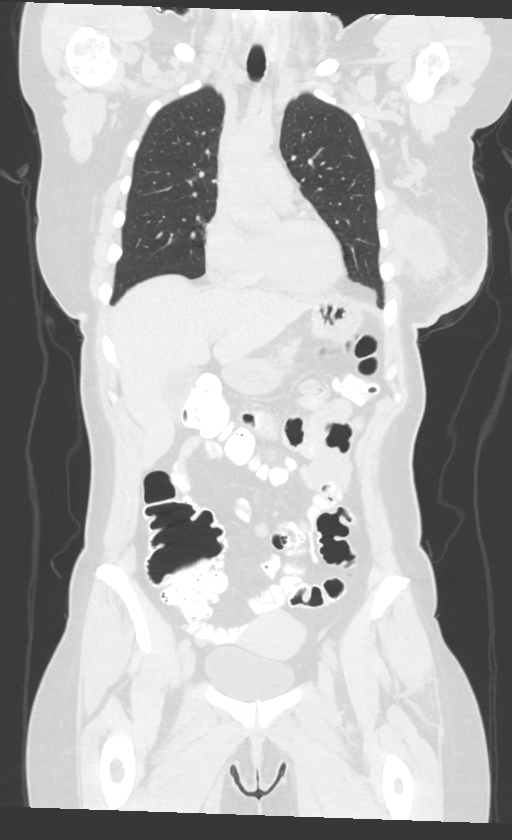
[im 99/165  lung]
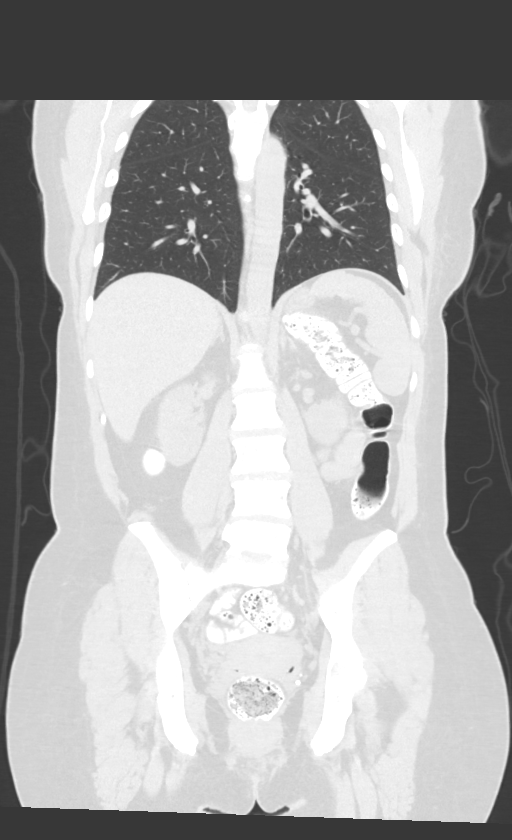

[14 of 36 positions shown; findings below may reference images not displayed]

FINDINGS: CT CHEST FINDINGS

Cardiovascular: No acute findings.

Mediastinum/Lymph Nodes: Mild left axillary lymphadenopathy is seen,
with largest lymph node measuring 11 mm in short axis. No other
sites of lymphadenopathy identified.

Lungs/Pleura: No evidence of infiltrate, mass, or pleural effusion.

Musculoskeletal: Large left breast mass is seen measuring 7.4 x
cm, with prominent skin thickening in the inferior left breast. This
mass shows involvement of the left pectoralis muscle. No suspicious
bone lesions identified.

CT ABDOMEN AND PELVIS FINDINGS

Hepatobiliary: No masses visualized on this unenhanced exam.
Gallbladder is unremarkable. No evidence of biliary ductal
dilatation.

Pancreas: No mass or inflammatory changes identified on this
unenhanced exam.

Spleen:  Within normal limits in size.

Adrenals/Urinary Tract: No evidence of urolithiasis or
hydronephrosis. Unremarkable appearance of bladder.

Stomach/Bowel: No evidence of obstruction, inflammatory process, or
abnormal fluid collections. Normal appendix visualized.

Vascular/Lymphatic: No pathologically enlarged lymph nodes
identified. No abdominal aortic aneurysm.

Reproductive:  No masses or other significant abnormality.

Other:  None.

Musculoskeletal:  No suspicious bone lesions identified.
IMPRESSION: Large left breast mass with deep involvement of left pectoralis
muscle and overlying skin thickening, consistent with known primary
breast carcinoma.

Mild left axillary lymphadenopathy, consistent with metastatic
disease.

No evidence of metastatic disease within the abdomen or pelvis.

## 2021-05-19 MED ORDER — TECHNETIUM TC 99M MEDRONATE IV KIT
19.7000 | PACK | Freq: Once | INTRAVENOUS | Status: AC
  Administered 2021-05-19: 19.7 via INTRAVENOUS

## 2021-05-19 NOTE — Telephone Encounter (Signed)
[facility] Clinical Social Work  Clinical Social Work was referred by new patient protocol for assessment of psychosocial needs.  Clinical Social Worker attempted to contact patient by phone  to offer support and assess for needs.   No answer. Left VM with direct contact information.      Maxie Slovacek E Nalanie Winiecki, LCSW  Clinical Social Worker [facility]        

## 2021-05-19 NOTE — Telephone Encounter (Signed)
Scheduled appts per 5/18 sch msg. Pt aware.

## 2021-05-20 ENCOUNTER — Encounter: Payer: Self-pay | Admitting: *Deleted

## 2021-05-20 ENCOUNTER — Encounter: Payer: Self-pay | Admitting: Hematology and Oncology

## 2021-05-20 ENCOUNTER — Inpatient Hospital Stay: Payer: 59

## 2021-05-20 ENCOUNTER — Ambulatory Visit (HOSPITAL_COMMUNITY)
Admission: RE | Admit: 2021-05-20 | Discharge: 2021-05-20 | Disposition: A | Discharge status: Home / Self Care | Payer: 59 | Source: Ambulatory Visit | Attending: Hematology and Oncology | Admitting: Hematology and Oncology

## 2021-05-20 DIAGNOSIS — Z01818 Encounter for other preprocedural examination: Secondary | ICD-10-CM | POA: Diagnosis present

## 2021-05-20 DIAGNOSIS — Z170 Estrogen receptor positive status [ER+]: Secondary | ICD-10-CM

## 2021-05-20 DIAGNOSIS — Z0189 Encounter for other specified special examinations: Secondary | ICD-10-CM | POA: Diagnosis not present

## 2021-05-20 DIAGNOSIS — I517 Cardiomegaly: Secondary | ICD-10-CM | POA: Insufficient documentation

## 2021-05-20 DIAGNOSIS — C50412 Malignant neoplasm of upper-outer quadrant of left female breast: Secondary | ICD-10-CM | POA: Insufficient documentation

## 2021-05-20 LAB — ECHOCARDIOGRAM COMPLETE
Area-P 1/2: 6.71 cm2
Calc EF: 67.7 %
S' Lateral: 2.1 cm
Single Plane A2C EF: 70.1 %
Single Plane A4C EF: 69 %

## 2021-05-20 NOTE — Progress Notes (Signed)
  Echocardiogram 2D Echocardiogram has been performed.  Bobbye Charleston 05/20/2021, 10:05 AM

## 2021-05-20 NOTE — Progress Notes (Signed)
Met with patient/accompanying adult by elevators to introduce myself as Arboriculturist and to offer available resources.  Discussed one-time $1000 Radio broadcast assistant to assist with personal expenses while going through treatment. Advised of income guidelines.  Also, discussed available copay assistance if ded/OOP have not been met for insurance. Advised to reach out to insurance company if unsure of the status.  Gave them my card if interested and for any additional financial questions or concerns.

## 2021-05-21 ENCOUNTER — Encounter: Payer: Self-pay | Admitting: *Deleted

## 2021-05-21 ENCOUNTER — Ambulatory Visit: Payer: Self-pay | Admitting: General Surgery

## 2021-05-21 NOTE — Anesthesia Preprocedure Evaluation (Addendum)
Anesthesia Evaluation  Patient identified by MRN, date of birth, ID band Patient awake    Reviewed: Allergy & Precautions, NPO status , Patient's Chart, lab work & pertinent test results  Airway Mallampati: I  TM Distance: >3 FB Neck ROM: Full    Dental  (+) Teeth Intact, Dental Advisory Given   Pulmonary former smoker,    Pulmonary exam normal breath sounds clear to auscultation       Cardiovascular negative cardio ROS   Rhythm:Regular Rate:Tachycardia     Neuro/Psych PSYCHIATRIC DISORDERS Anxiety negative neurological ROS     GI/Hepatic negative GI ROS, Neg liver ROS,   Endo/Other  negative endocrine ROS  Renal/GU negative Renal ROS  negative genitourinary   Musculoskeletal negative musculoskeletal ROS (+)   Abdominal   Peds  Hematology negative hematology ROS (+)   Anesthesia Other Findings Left breast ca   Reproductive/Obstetrics negative OB ROS                            Anesthesia Physical Anesthesia Plan  ASA: II  Anesthesia Plan: General   Post-op Pain Management:    Induction: Intravenous  PONV Risk Score and Plan: 3 and Ondansetron, Dexamethasone, Midazolam, Treatment may vary due to age or medical condition and Scopolamine patch - Pre-op  Airway Management Planned: LMA  Additional Equipment: None  Intra-op Plan:   Post-operative Plan: Extubation in OR  Informed Consent: I have reviewed the patients History and Physical, chart, labs and discussed the procedure including the risks, benefits and alternatives for the proposed anesthesia with the patient or authorized representative who has indicated his/her understanding and acceptance.     Dental advisory given  Plan Discussed with: CRNA  Anesthesia Plan Comments:        Anesthesia Quick Evaluation

## 2021-05-21 NOTE — Progress Notes (Signed)
The following biosimilar Udenyca ([id number]) has been selected for use in this patient, required per insurance.  Henreitta Leber, PharmD 05/21/21 @ 0930

## 2021-05-22 ENCOUNTER — Ambulatory Visit (HOSPITAL_COMMUNITY): Payer: 59

## 2021-05-22 ENCOUNTER — Encounter (HOSPITAL_BASED_OUTPATIENT_CLINIC_OR_DEPARTMENT_OTHER): Payer: Self-pay | Admitting: General Surgery

## 2021-05-22 ENCOUNTER — Ambulatory Visit (HOSPITAL_BASED_OUTPATIENT_CLINIC_OR_DEPARTMENT_OTHER): Payer: 59 | Admitting: Certified Registered"

## 2021-05-22 ENCOUNTER — Encounter: Payer: Self-pay | Admitting: *Deleted

## 2021-05-22 ENCOUNTER — Other Ambulatory Visit: Payer: Self-pay

## 2021-05-22 ENCOUNTER — Ambulatory Visit (HOSPITAL_BASED_OUTPATIENT_CLINIC_OR_DEPARTMENT_OTHER)
Admission: RE | Admit: 2021-05-22 | Discharge: 2021-05-22 | Disposition: A | Discharge status: Home / Self Care | Payer: 59 | Attending: General Surgery | Admitting: General Surgery

## 2021-05-22 ENCOUNTER — Encounter (HOSPITAL_BASED_OUTPATIENT_CLINIC_OR_DEPARTMENT_OTHER)
Admission: RE | Disposition: A | Discharge status: Home / Self Care | Payer: Self-pay | Source: Home / Self Care | Attending: General Surgery

## 2021-05-22 DIAGNOSIS — Z171 Estrogen receptor negative status [ER-]: Secondary | ICD-10-CM | POA: Insufficient documentation

## 2021-05-22 DIAGNOSIS — C50412 Malignant neoplasm of upper-outer quadrant of left female breast: Secondary | ICD-10-CM | POA: Insufficient documentation

## 2021-05-22 DIAGNOSIS — Z95828 Presence of other vascular implants and grafts: Secondary | ICD-10-CM

## 2021-05-22 DIAGNOSIS — Z419 Encounter for procedure for purposes other than remedying health state, unspecified: Secondary | ICD-10-CM

## 2021-05-22 HISTORY — PX: PORTACATH PLACEMENT: SHX2246

## 2021-05-22 HISTORY — DX: Anxiety disorder, unspecified: F41.9

## 2021-05-22 LAB — POCT PREGNANCY, URINE: Preg Test, Ur: NEGATIVE

## 2021-05-22 IMAGING — CR DG CHEST 1V PORT
1 series · 1 of 1 positions shown · non-contrast
Comparison: CT from 3 days ago

CLINICAL DATA: Central line insertion

EXAM:
PORTABLE CHEST 1 VIEW

[chest ap]
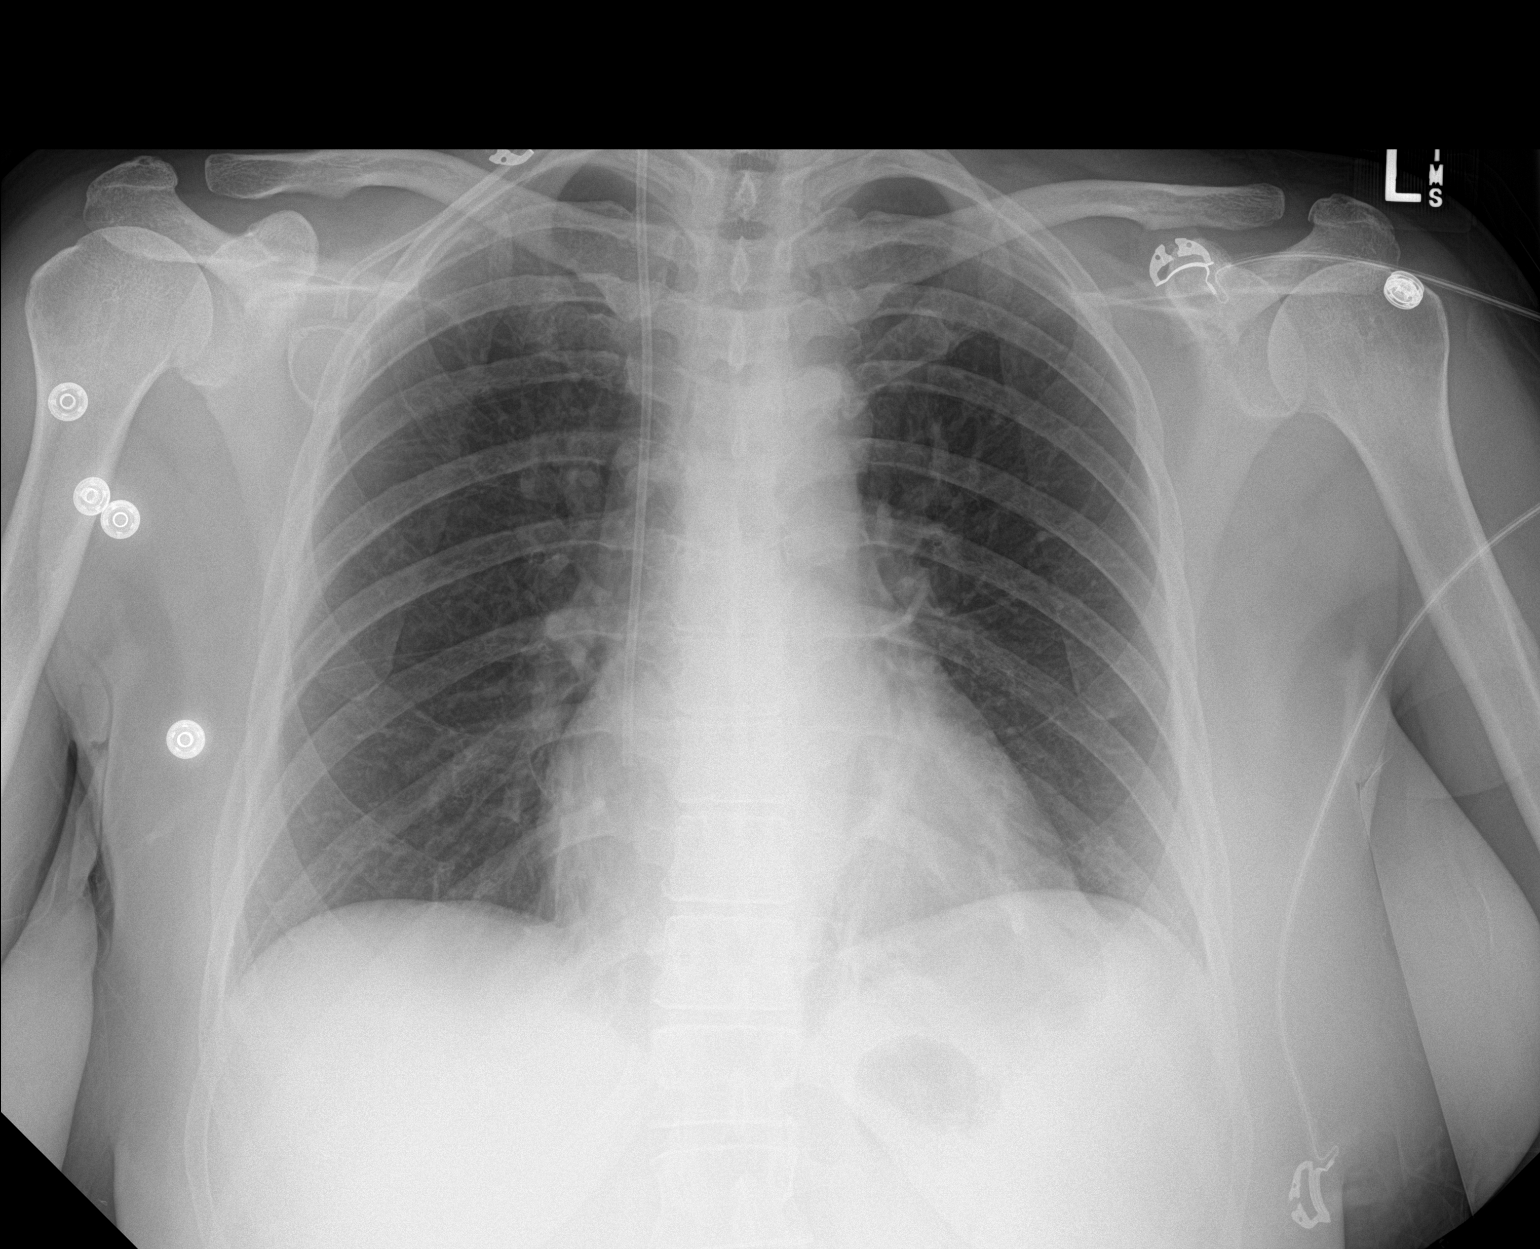

[1 of 1 positions shown; findings below may reference images not displayed]

FINDINGS: Right-sided porta catheter with tip at the upper right atrium. No
catheter kink. No pneumothorax or mediastinal widening. Minor
atelectasis at the bases.
IMPRESSION: No complicating features of porta catheter placement.

## 2021-05-22 SURGERY — INSERTION, TUNNELED CENTRAL VENOUS DEVICE, WITH PORT
Anesthesia: General | Site: Breast

## 2021-05-22 MED ORDER — ACETAMINOPHEN 500 MG PO TABS
1000.0000 mg | ORAL_TABLET | ORAL | Status: AC
  Administered 2021-05-22: 1000 mg via ORAL

## 2021-05-22 MED ORDER — CELECOXIB 200 MG PO CAPS
200.0000 mg | ORAL_CAPSULE | ORAL | Status: AC
  Administered 2021-05-22: 200 mg via ORAL

## 2021-05-22 MED ORDER — CHLORHEXIDINE GLUCONATE CLOTH 2 % EX PADS: 6.0000 | MEDICATED_PAD | Freq: Once | CUTANEOUS | Status: DC

## 2021-05-22 MED ORDER — HEPARIN SOD (PORK) LOCK FLUSH 100 UNIT/ML IV SOLN
INTRAVENOUS | Status: AC
  Filled 2021-05-22: qty 5

## 2021-05-22 MED ORDER — ONDANSETRON HCL 4 MG/2ML IJ SOLN
INTRAMUSCULAR | Status: DC | PRN
  Administered 2021-05-22: 4 mg via INTRAVENOUS

## 2021-05-22 MED ORDER — HEPARIN SOD (PORK) LOCK FLUSH 100 UNIT/ML IV SOLN
INTRAVENOUS | Status: DC | PRN
  Administered 2021-05-22: 500 [IU] via INTRAVENOUS

## 2021-05-22 MED ORDER — ACETAMINOPHEN 500 MG PO TABS
ORAL_TABLET | ORAL | Status: AC
  Filled 2021-05-22: qty 2

## 2021-05-22 MED ORDER — ONDANSETRON HCL 4 MG/2ML IJ SOLN
INTRAMUSCULAR | Status: AC
  Filled 2021-05-22: qty 2

## 2021-05-22 MED ORDER — DEXAMETHASONE SODIUM PHOSPHATE 10 MG/ML IJ SOLN
INTRAMUSCULAR | Status: DC | PRN
  Administered 2021-05-22: 5 mg via INTRAVENOUS

## 2021-05-22 MED ORDER — CELECOXIB 200 MG PO CAPS
ORAL_CAPSULE | ORAL | Status: AC
  Filled 2021-05-22: qty 1

## 2021-05-22 MED ORDER — PHENYLEPHRINE HCL (PRESSORS) 10 MG/ML IV SOLN
INTRAVENOUS | Status: DC | PRN
  Administered 2021-05-22 (×2): 80 ug via INTRAVENOUS
  Administered 2021-05-22: 40 ug via INTRAVENOUS
  Administered 2021-05-22 (×2): 80 ug via INTRAVENOUS
  Administered 2021-05-22: 40 ug via INTRAVENOUS

## 2021-05-22 MED ORDER — OXYCODONE HCL 5 MG/5ML PO SOLN: 5.0000 mg | Freq: Once | ORAL | Status: DC | PRN

## 2021-05-22 MED ORDER — EPHEDRINE SULFATE 50 MG/ML IJ SOLN
INTRAMUSCULAR | Status: DC | PRN
  Administered 2021-05-22 (×2): 10 mg via INTRAVENOUS

## 2021-05-22 MED ORDER — BUPIVACAINE-EPINEPHRINE 0.25% -1:200000 IJ SOLN
INTRAMUSCULAR | Status: DC | PRN
  Administered 2021-05-22: 7 mL

## 2021-05-22 MED ORDER — HYDROMORPHONE HCL 1 MG/ML IJ SOLN: 0.2500 mg | INTRAMUSCULAR | Status: DC | PRN

## 2021-05-22 MED ORDER — FENTANYL CITRATE (PF) 100 MCG/2ML IJ SOLN
INTRAMUSCULAR | Status: AC
  Filled 2021-05-22: qty 2

## 2021-05-22 MED ORDER — LIDOCAINE HCL (CARDIAC) PF 100 MG/5ML IV SOSY
PREFILLED_SYRINGE | INTRAVENOUS | Status: DC | PRN
  Administered 2021-05-22: 80 mg via INTRAVENOUS

## 2021-05-22 MED ORDER — LIDOCAINE 2% (20 MG/ML) 5 ML SYRINGE
INTRAMUSCULAR | Status: AC
  Filled 2021-05-22: qty 5

## 2021-05-22 MED ORDER — OXYCODONE HCL 5 MG PO TABS: 5.0000 mg | ORAL_TABLET | Freq: Once | ORAL | Status: DC | PRN

## 2021-05-22 MED ORDER — HEPARIN (PORCINE) IN NACL 2-0.9 UNITS/ML
INTRAMUSCULAR | Status: AC | PRN
  Administered 2021-05-22: 1 via INTRAVENOUS

## 2021-05-22 MED ORDER — LACTATED RINGERS IV SOLN: INTRAVENOUS | Status: DC

## 2021-05-22 MED ORDER — PROPOFOL 10 MG/ML IV BOLUS
INTRAVENOUS | Status: DC | PRN
  Administered 2021-05-22: 170 mg via INTRAVENOUS
  Administered 2021-05-22: 100 mg via INTRAVENOUS

## 2021-05-22 MED ORDER — CEFAZOLIN SODIUM-DEXTROSE 2-4 GM/100ML-% IV SOLN
2.0000 g | INTRAVENOUS | Status: AC
  Administered 2021-05-22: 2 g via INTRAVENOUS

## 2021-05-22 MED ORDER — PROPOFOL 10 MG/ML IV BOLUS
INTRAVENOUS | Status: AC
  Filled 2021-05-22: qty 20

## 2021-05-22 MED ORDER — EPHEDRINE 5 MG/ML INJ
INTRAVENOUS | Status: AC
  Filled 2021-05-22: qty 10

## 2021-05-22 MED ORDER — PHENYLEPHRINE 40 MCG/ML (10ML) SYRINGE FOR IV PUSH (FOR BLOOD PRESSURE SUPPORT)
PREFILLED_SYRINGE | INTRAVENOUS | Status: AC
  Filled 2021-05-22: qty 10

## 2021-05-22 MED ORDER — SCOPOLAMINE 1 MG/3DAYS TD PT72
MEDICATED_PATCH | TRANSDERMAL | Status: AC
  Filled 2021-05-22: qty 1

## 2021-05-22 MED ORDER — HYDROCODONE-ACETAMINOPHEN 5-325 MG PO TABS: 1.0000 | ORAL_TABLET | Freq: Four times a day (QID) | ORAL | 0 refills | Status: DC | PRN

## 2021-05-22 MED ORDER — GABAPENTIN 300 MG PO CAPS
ORAL_CAPSULE | ORAL | Status: AC
  Filled 2021-05-22: qty 1

## 2021-05-22 MED ORDER — FENTANYL CITRATE (PF) 100 MCG/2ML IJ SOLN
INTRAMUSCULAR | Status: DC | PRN
  Administered 2021-05-22 (×4): 25 ug via INTRAVENOUS

## 2021-05-22 MED ORDER — MIDAZOLAM HCL 5 MG/5ML IJ SOLN
INTRAMUSCULAR | Status: DC | PRN
  Administered 2021-05-22: 2 mg via INTRAVENOUS

## 2021-05-22 MED ORDER — PROMETHAZINE HCL 25 MG/ML IJ SOLN: 6.2500 mg | INTRAMUSCULAR | Status: DC | PRN

## 2021-05-22 MED ORDER — MIDAZOLAM HCL 2 MG/2ML IJ SOLN
INTRAMUSCULAR | Status: AC
  Filled 2021-05-22: qty 2

## 2021-05-22 MED ORDER — CEFAZOLIN SODIUM-DEXTROSE 2-4 GM/100ML-% IV SOLN
INTRAVENOUS | Status: AC
  Filled 2021-05-22: qty 100

## 2021-05-22 MED ORDER — GABAPENTIN 300 MG PO CAPS
300.0000 mg | ORAL_CAPSULE | ORAL | Status: AC
  Administered 2021-05-22: 300 mg via ORAL

## 2021-05-22 MED ORDER — SCOPOLAMINE 1 MG/3DAYS TD PT72
1.0000 | MEDICATED_PATCH | TRANSDERMAL | Status: DC
  Administered 2021-05-22: 1.5 mg via TRANSDERMAL

## 2021-05-22 SURGICAL SUPPLY — 45 items
BAG DECANTER FOR FLEXI CONT (MISCELLANEOUS) ×3 IMPLANT
BLADE SURG 15 STRL LF DISP TIS (BLADE) ×1 IMPLANT
BLADE SURG 15 STRL SS (BLADE) ×2
CANISTER SUCT 1200ML W/VALVE (MISCELLANEOUS) IMPLANT
CHLORAPREP W/TINT 26 (MISCELLANEOUS) ×3 IMPLANT
CLEANER CAUTERY TIP 5X5 PAD (MISCELLANEOUS) ×1 IMPLANT
COVER BACK TABLE 60X90IN (DRAPES) ×3 IMPLANT
COVER MAYO STAND STRL (DRAPES) ×3 IMPLANT
COVER WAND RF STERILE (DRAPES) IMPLANT
DECANTER SPIKE VIAL GLASS SM (MISCELLANEOUS) IMPLANT
DERMABOND ADVANCED (GAUZE/BANDAGES/DRESSINGS) ×2
DERMABOND ADVANCED .7 DNX12 (GAUZE/BANDAGES/DRESSINGS) ×1 IMPLANT
DRAPE C-ARM 42X72 X-RAY (DRAPES) ×3 IMPLANT
DRAPE LAPAROSCOPIC ABDOMINAL (DRAPES) ×3 IMPLANT
DRAPE UTILITY XL STRL (DRAPES) ×3 IMPLANT
ELECT REM PT RETURN 9FT ADLT (ELECTROSURGICAL) ×3
ELECTRODE REM PT RTRN 9FT ADLT (ELECTROSURGICAL) ×1 IMPLANT
GLOVE SURG ENC MOIS LTX SZ7.5 (GLOVE) ×3 IMPLANT
GOWN STRL REUS W/ TWL LRG LVL3 (GOWN DISPOSABLE) ×2 IMPLANT
GOWN STRL REUS W/TWL 2XL LVL3 (GOWN DISPOSABLE) ×6 IMPLANT
GOWN STRL REUS W/TWL LRG LVL3 (GOWN DISPOSABLE) ×4
IV KIT MINILOC 20X1 SAFETY (NEEDLE) IMPLANT
KIT PORT POWER 8FR ISP CVUE (Port) ×3 IMPLANT
NDL SAFETY ECLIPSE 18X1.5 (NEEDLE) IMPLANT
NEEDLE HYPO 18GX1.5 SHARP (NEEDLE)
NEEDLE HYPO 22GX1.5 SAFETY (NEEDLE) IMPLANT
NEEDLE HYPO 25X1 1.5 SAFETY (NEEDLE) ×3 IMPLANT
NEEDLE SPNL 22GX3.5 QUINCKE BK (NEEDLE) IMPLANT
PACK BASIN DAY SURGERY FS (CUSTOM PROCEDURE TRAY) ×3 IMPLANT
PAD CLEANER CAUTERY TIP 5X5 (MISCELLANEOUS) ×2
PENCIL SMOKE EVACUATOR (MISCELLANEOUS) ×3 IMPLANT
SLEEVE SCD COMPRESS KNEE MED (STOCKING) IMPLANT
SUT MON AB 4-0 PC3 18 (SUTURE) ×3 IMPLANT
SUT PROLENE 2 0 SH DA (SUTURE) ×3 IMPLANT
SUT SILK 2 0 TIES 17X18 (SUTURE)
SUT SILK 2-0 18XBRD TIE BLK (SUTURE) IMPLANT
SUT VIC AB 3-0 SH 27 (SUTURE) ×2
SUT VIC AB 3-0 SH 27X BRD (SUTURE) ×1 IMPLANT
SYR 10ML LL (SYRINGE) IMPLANT
SYR 5ML LL (SYRINGE) ×3 IMPLANT
SYR CONTROL 10ML LL (SYRINGE) ×6 IMPLANT
TOWEL GREEN STERILE FF (TOWEL DISPOSABLE) ×6 IMPLANT
TUBE CONNECTING 20'X1/4 (TUBING)
TUBE CONNECTING 20X1/4 (TUBING) IMPLANT
YANKAUER SUCT BULB TIP NO VENT (SUCTIONS) IMPLANT

## 2021-05-22 NOTE — Anesthesia Postprocedure Evaluation (Signed)
Anesthesia Post Note  Patient: Sheena Mcdaniel  Procedure(s) Performed: INSERTION PORT-A-CATH (N/A Breast)     Patient location during evaluation: PACU Anesthesia Type: General Level of consciousness: awake and alert Pain management: pain level controlled Vital Signs Assessment: post-procedure vital signs reviewed and stable Respiratory status: spontaneous breathing, nonlabored ventilation and respiratory function stable Cardiovascular status: blood pressure returned to baseline and stable Postop Assessment: no apparent nausea or vomiting Anesthetic complications: no   No complications documented.  Last Vitals:  Vitals:   05/22/21 1157 05/22/21 1242  BP: (!) 146/89 127/73  Pulse: 100 81  Resp: 17 16  Temp: 37.3 C 36.5 C  SpO2: 98% 96%    Last Pain:  Vitals:   05/22/21 1152  TempSrc:   PainSc: 0-No pain                 Catalina Gravel

## 2021-05-22 NOTE — Interval H&P Note (Signed)
History and Physical Interval Note:  05/22/2021 9:17 AM  Sheena Mcdaniel  has presented today for surgery, with the diagnosis of LEFT BREAST CANCER.  The various methods of treatment have been discussed with the patient and family. After consideration of risks, benefits and other options for treatment, the patient has consented to  Procedure(s): INSERTION PORT-A-CATH (N/A) as a surgical intervention.  The patient's history has been reviewed, patient examined, no change in status, stable for surgery.  I have reviewed the patient's chart and labs.  Questions were answered to the patient's satisfaction.     [name]

## 2021-05-22 NOTE — Transfer of Care (Signed)
Immediate Anesthesia Transfer of Care Note  Patient: Sheena Mcdaniel  Procedure(s) Performed: INSERTION PORT-A-CATH (N/A Breast)  Patient Location: PACU  Anesthesia Type:General  Level of Consciousness: awake, alert  and oriented  Airway & Oxygen Therapy: Patient Spontanous Breathing and Patient connected to face mask oxygen  Post-op Assessment: Report given to RN and Post -op Vital signs reviewed and stable  Post vital signs: Reviewed and stable  Last Vitals:  Vitals Value Taken Time  BP 123/65 05/22/21 1057  Temp 36.7 C 05/22/21 1057  Pulse 116 05/22/21 1059  Resp 21 05/22/21 1059  SpO2 100 % 05/22/21 1059  Vitals shown include unvalidated device data.  Last Pain:  Vitals:   05/22/21 0843  TempSrc: Oral  PainSc: 0-No pain         Complications: No complications documented.

## 2021-05-22 NOTE — Anesthesia Procedure Notes (Signed)
Procedure Name: LMA Insertion Date/Time: 05/22/2021 9:45 AM Performed by: Lavonia Dana, CRNA Pre-anesthesia Checklist: Patient identified, Emergency Drugs available, Suction available and Patient being monitored Patient Re-evaluated:Patient Re-evaluated prior to induction Oxygen Delivery Method: Circle system utilized Preoxygenation: Pre-oxygenation with 100% oxygen Induction Type: IV induction Ventilation: Mask ventilation without difficulty LMA: LMA inserted LMA Size: 4.0 Number of attempts: 1 Airway Equipment and Method: Bite block Placement Confirmation: positive ETCO2 Tube secured with: Tape Dental Injury: Teeth and Oropharynx as per pre-operative assessment

## 2021-05-22 NOTE — Op Note (Signed)
05/22/2021  10:57 AM  PATIENT:  Sheena Mcdaniel  50 y.o. female  PRE-OPERATIVE DIAGNOSIS:  LEFT BREAST CANCER  POST-OPERATIVE DIAGNOSIS:  LEFT BREAST CANCER  PROCEDURE:  Procedure(s): INSERTION PORT-A-CATH, RIGHT IJ  SURGEON:  Surgeon(s) and Role:    * Jovita Kussmaul, MD - Primary  PHYSICIAN ASSISTANT:   ASSISTANTS: none   ANESTHESIA:   local and general  EBL:  5 mL   BLOOD ADMINISTERED:none  DRAINS: none   LOCAL MEDICATIONS USED:  MARCAINE     SPECIMEN:  No Specimen  DISPOSITION OF SPECIMEN:  N/A  COUNTS:  YES  TOURNIQUET:  * No tourniquets in log *  DICTATION: .Dragon Dictation   After informed consent was obtained the patient was brought to the operating room and placed in the supine position on the operating table.  After adequate induction of general anesthesia a roll was placed between the patient's shoulder blades to extend the shoulder slightly.  The right chest and neck area were then prepped with ChloraPrep, allowed to dry, and draped in usual sterile manner.  An appropriate timeout was performed.  The patient was placed in Trendelenburg position.  The area lateral to the bend of the clavicle was infiltrated with quarter percent Marcaine.  A large bore needle from the Port-A-Cath kit was used to slide beneath the bend of the clavicle heading towards the sternal notch.  After 2 passes I was not able to identify the subclavian vein.  I then elected to move up to the internal jugular vein in the neck.  The ultrasound machine was used to identify the vein.  Under direct vision the first attempt to access the subclavian vein actually accessed the carotid artery.  The needle was removed and pressure was held for several minutes.  A second attempt to access the right internal jugular vein was then successful under ultrasound guidance and direct vision.  I was unable to feed a wire through the needle using the Seldinger technique without difficulty.  The wire was confirmed  in the central venous system using real-time fluoroscopy.  Next a small incision was made on the left chest wall lateral to the bend of the clavicle with a 15 blade knife.  The incision was carried through the skin and subcutaneous tissue sharply with the electrocautery.  A subcutaneous pocket was created inferior to the incision by blunt finger dissection.  A small incision was also made on the right neck at the wire entry site.  The 2 incisions were then connected by blunt hemostat dissection.  A tendon passer was placed across the tunnel and used to bring the tubing through the tunnel.  The tubing was then placed on the reservoir and the reservoir was placed in the pocket.  The length of the tubing was again estimated using real-time fluoroscopy.  The tubing was cut to the appropriate length.  Next a sheath and dilator were fed over the wire using the Seldinger technique without difficulty.  The dilator and wire were removed from the patient.  The tubing was fed through the sheath as far as it would go and then held in place while the sheath was gently cracked and separated.  Another real-time fluoroscopy image showed the tip of the catheter to be in the distal superior vena cava.  The tubing was then permanently anchored to the reservoir.  The reservoir was anchored in the pocket with two 2-0 Prolene stitches.  The port was then aspirated and it aspirated blood easily.  The  port was then flushed initially with a dilute heparin solution and then with a more concentrated heparin solution.  The incision on the right neck was closed with an interrupted 4-0 Monocryl subcuticular stitch.  The subcutaneous tissue was closed over the port with interrupted 3-0 Vicryl stitches.  The skin was then closed with a running 4-0 Monocryl subcuticular stitch.  Dermabond dressings were applied.  The patient tolerated the procedure well.  At the end of the case all needle sponge and instrument counts were correct.  The patient was  then awakened and taken recovery in stable condition.  PLAN OF CARE: Discharge to home after PACU  PATIENT DISPOSITION:  PACU - hemodynamically stable.   Delay start of Pharmacological VTE agent (>24hrs) due to surgical blood loss or risk of bleeding: not applicable

## 2021-05-22 NOTE — Discharge Instructions (Signed)
May take Tylenol after 2:45pm, if needed May take NSAIDS (Ibuprofen, Motrin) after 2:45pm, if needed.    Post Anesthesia Home Care Instructions  Activity: Get plenty of rest for the remainder of the day. A responsible individual must stay with you for 24 hours following the procedure.  For the next 24 hours, DO NOT: -Drive a car -Paediatric nurse -Drink alcoholic beverages -Take any medication unless instructed by your physician -Make any legal decisions or sign important papers.  Meals: Start with liquid foods such as gelatin or soup. Progress to regular foods as tolerated. Avoid greasy, spicy, heavy foods. If nausea and/or vomiting occur, drink only clear liquids until the nausea and/or vomiting subsides. Call your physician if vomiting continues.  Special Instructions/Symptoms: Your throat may feel dry or sore from the anesthesia or the breathing tube placed in your throat during surgery. If this causes discomfort, gargle with warm salt water. The discomfort should disappear within 24 hours.  If you had a scopolamine patch placed behind your ear for the management of post- operative nausea and/or vomiting:  1. The medication in the patch is effective for 72 hours, after which it should be removed.  Wrap patch in a tissue and discard in the trash. Wash hands thoroughly with soap and water. 2. You may remove the patch earlier than 72 hours if you experience unpleasant side effects which may include dry mouth, dizziness or visual disturbances. 3. Avoid touching the patch. Wash your hands with soap and water after contact with the patch.

## 2021-05-22 NOTE — H&P (Signed)
Sheena Mcdaniel  Location: [facility] Patient #: [id number] DOB: [birth year] Undefined / Language: Cleophus Molt / Race: White Female   History of Present Illness  The patient is a 50 year old female who presents with breast cancer. We are asked to see the patient in consultation by Dr. Lindi Adie to evaluate her for a new left breast cancer. The patient is a 50 year old white female who felt a mass in the lateral aspect of the left breast about a month ago. She thought this was a cyst. She went to her medical doctor who ordered imaging. She was found to have a 10 cm mass in the lateral aspect of the left breast with multiple abnormal-looking lymph nodes. 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 The lymph nodes could not be biopsied due to their proximity to the vessels. She is otherwise in pretty good health and does not smoke.   Past Surgical History Breast Biopsy  Bilateral.  Diagnostic Studies History  Colonoscopy  never Mammogram  within last year Pap Smear  1-5 years ago  Allergies No Known Drug Allergies   Medication History No Current Medications Medications Reconciled  Social History Alcohol use  Occasional alcohol use. Caffeine use  Coffee, Tea. No drug use  Tobacco use  Never smoker.  Family History  Cancer  Father. Depression  Father, Mother. Hypertension  Father, Mother.  Pregnancy / Birth History  Age at menarche  40 years. Gravida  3 Irregular periods  Length (months) of breastfeeding  12-24 Maternal age  90-20 Para  2  Other Problems Breast Cancer     Review of Systems  General Present- Appetite Loss and Weight Loss. Not Present- Chills, Fatigue, Fever, Night Sweats and Weight Gain. Skin Not Present- Change in Wart/Mole, Dryness, Hives, Jaundice, New Lesions, Non-Healing Wounds, Rash and Ulcer. HEENT Present- Seasonal Allergies. Not  Present- Earache, Hearing Loss, Hoarseness, Nose Bleed, Oral Ulcers, Ringing in the Ears, Sinus Pain, Sore Throat, Visual Disturbances, Wears glasses/contact lenses and Yellow Eyes. Respiratory Not Present- Bloody sputum, Chronic Cough, Difficulty Breathing, Snoring and Wheezing. Breast Present- Breast Mass and Nipple Discharge. Not Present- Breast Pain and Skin Changes. Cardiovascular Not Present- Chest Pain, Difficulty Breathing Lying Down, Leg Cramps, Palpitations, Rapid Heart Rate, Shortness of Breath and Swelling of Extremities. Gastrointestinal Not Present- Abdominal Pain, Bloating, Bloody Stool, Change in Bowel Habits, Chronic diarrhea, Constipation, Difficulty Swallowing, Excessive gas, Gets full quickly at meals, Hemorrhoids, Indigestion, Nausea, Rectal Pain and Vomiting. Female Genitourinary Present- Frequency. Not Present- Nocturia, Painful Urination, Pelvic Pain and Urgency. Musculoskeletal Not Present- Back Pain, Joint Pain, Joint Stiffness, Muscle Pain, Muscle Weakness and Swelling of Extremities. Neurological Not Present- Decreased Memory, Fainting, Headaches, Numbness, Seizures, Tingling, Tremor, Trouble walking and Weakness. Psychiatric Not Present- Anxiety, Bipolar, Change in Sleep Pattern, Depression, Fearful and Frequent crying. Endocrine Not Present- Cold Intolerance, Excessive Hunger, Hair Changes, Heat Intolerance, Hot flashes and New Diabetes. Hematology Not Present- Blood Thinners, Easy Bruising, Excessive bleeding, Gland problems, HIV and Persistent Infections.  Vitals  Weight: 158 lb Height: 64in Body Surface Area: 1.77 m Body Mass Index: 27.12 kg/m  Pulse: 115 (Regular)  BP: 162/100(Sitting, Left Arm, Standard)       Physical Exam  General Mental Status-Alert. General Appearance-Consistent with stated age. Hydration-Well hydrated. Voice-Normal.  Head and Neck Head-normocephalic, atraumatic with no lesions or palpable  masses. Trachea-midline. Thyroid Gland Characteristics - normal size and consistency.  Eye Eyeball - Bilateral-Extraocular movements intact. Sclera/Conjunctiva - Bilateral-No scleral icterus.  Chest and Lung Exam Chest and lung exam reveals -quiet, even and easy respiratory effort with no use of accessory muscles and on auscultation, normal breath sounds, no adventitious sounds and normal vocal resonance. Inspection Chest Wall - Normal. Back - normal.  Breast Note: There is a 10 cm palpable mass in the lateral aspect of the left breast with some skin redness suggesting dermal involvement. There is no palpable mass in the right breast. There is no palpable axillary, supraclavicular, or cervical lymphadenopathy. The mass is mobile and does not appear to be tethered to the chest wall   Cardiovascular Cardiovascular examination reveals -normal heart sounds, regular rate and rhythm with no murmurs and normal pedal pulses bilaterally.  Abdomen Inspection Inspection of the abdomen reveals - No Hernias. Skin - Scar - no surgical scars. Palpation/Percussion Palpation and Percussion of the abdomen reveal - Soft, Non Tender, No Rebound tenderness, No Rigidity (guarding) and No hepatosplenomegaly. Auscultation Auscultation of the abdomen reveals - Bowel sounds normal.  Neurologic Neurologic evaluation reveals -alert and oriented x 3 with no impairment of recent or remote memory. Mental Status-Normal.  Musculoskeletal Normal Exam - Left-Upper Extremity Strength Normal and Lower Extremity Strength Normal. Normal Exam - Right-Upper Extremity Strength Normal and Lower Extremity Strength Normal.  Lymphatic Head & Neck  General Head & Neck Lymphatics: Bilateral - Description - Normal. Axillary  General Axillary Region: Bilateral - Description - Normal. Tenderness - Non Tender. Femoral & Inguinal  Generalized Femoral & Inguinal Lymphatics: Bilateral - Description -  Normal. Tenderness - Non Tender.    Assessment & Plan  MALIGNANT NEOPLASM OF UPPER-OUTER QUADRANT OF LEFT BREAST IN FEMALE, ESTROGEN RECEPTOR NEGATIVE (C50.412) Impression: The patient appears to have a large 10 cm cancer in the outer aspect of the left breast with multiple abnormal-looking lymph nodes. Because of the size, the lymph nodes, and the triple negative nature of the cancer she will benefit from chemotherapy. I think she'll also benefit if this is given neoadjuvantly. She will need a Port-A-Cath for this. I have discussed with her in detail the risks and benefits of the operation as well as some of the technical aspects including the risk of pneumothorax and she understands and wishes to proceed. She will meet with medical and radiation oncology to discuss neoadjuvant and adjuvant therapy. She will need preoperative lymphedema testing. I will follow her while she is getting chemotherapy for her progress and to talk about definitive surgery when he gets closer to the end of treatment. I suspect she will end up needing a modified radical mastectomy. This patient encounter took 60 minutes today to perform the following: take history, perform exam, review outside records, interpret imaging, counsel the patient on their diagnosis and document encounter, findings & plan in the EHR Current Plans Referred to Oncology, for evaluation and follow up (Oncology). Routine. Referred to Physical Therapy, for evaluation and follow up (Physical Therapy). Routine.

## 2021-05-23 ENCOUNTER — Ambulatory Visit
Admission: RE | Admit: 2021-05-23 | Discharge: 2021-05-23 | Disposition: A | Discharge status: Home / Self Care | Payer: 59 | Source: Ambulatory Visit | Attending: Hematology and Oncology | Admitting: Hematology and Oncology

## 2021-05-23 DIAGNOSIS — Z170 Estrogen receptor positive status [ER+]: Secondary | ICD-10-CM

## 2021-05-23 DIAGNOSIS — C50412 Malignant neoplasm of upper-outer quadrant of left female breast: Secondary | ICD-10-CM

## 2021-05-26 ENCOUNTER — Inpatient Hospital Stay: Payer: 59 | Admitting: Hematology and Oncology

## 2021-05-26 ENCOUNTER — Encounter: Payer: Self-pay | Admitting: *Deleted

## 2021-05-26 ENCOUNTER — Inpatient Hospital Stay: Payer: 59

## 2021-05-26 ENCOUNTER — Encounter (HOSPITAL_BASED_OUTPATIENT_CLINIC_OR_DEPARTMENT_OTHER): Payer: Self-pay | Admitting: General Surgery

## 2021-05-27 ENCOUNTER — Ambulatory Visit
Admission: RE | Admit: 2021-05-27 | Discharge: 2021-05-27 | Disposition: A | Discharge status: Home / Self Care | Payer: 59 | Source: Ambulatory Visit | Attending: Hematology and Oncology | Admitting: Hematology and Oncology

## 2021-05-27 ENCOUNTER — Other Ambulatory Visit: Payer: Self-pay

## 2021-05-27 IMAGING — MR MR BREAST BILAT WO/W CM
7 of 12 series · 31 of 48 positions shown · IV contrast (7 ml gadavist)
Comparison: Previous exams.

CLINICAL DATA: 50-year-old female with recently diagnosed invasive
mammary carcinoma post ultrasound-guided core biopsy of a large 9-10
cm multicystic mass. A suspicious left axillary lymph node could not
be biopsied due to multiple large vessels coursing around the lymph
node.
TECHNIQUE: Multiplanar, multisequence MR images of both breasts were obtained
prior to and following the intravenous administration of 7 ml of
Gadavist

[Series 2: t2_tirm_tra ipat (a-p) · axial · 3.0mm · 0.70mm/px · 1 of 55 slices shown]
[im 1/55]
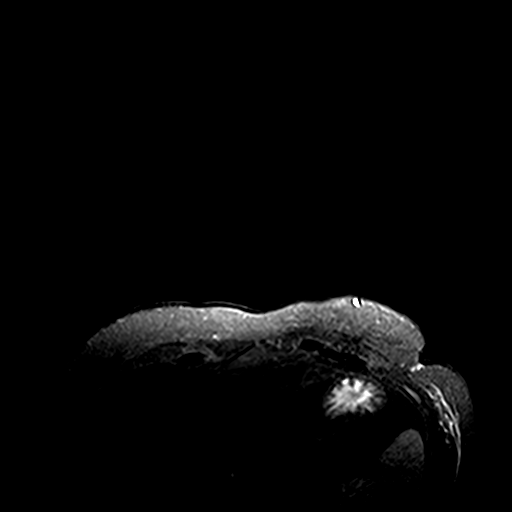

[Series 3: fl3d pre-cm no · axial · non-contrast · 1.2mm · 0.94mm/px · z∈[-86,+85]mm · 5 of 144 slices shown]
[im 1/144]
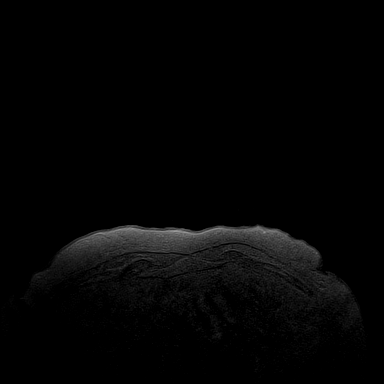
[im 36/144]
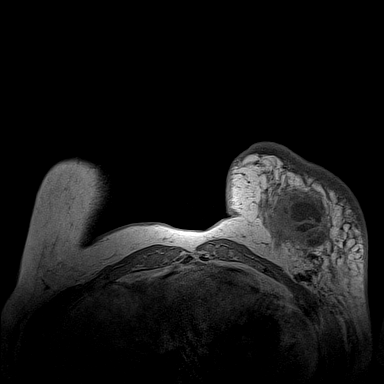
[im 72/144]
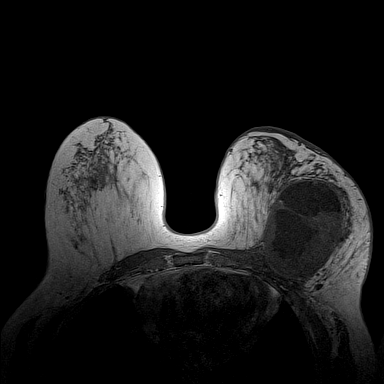
[im 108/144]
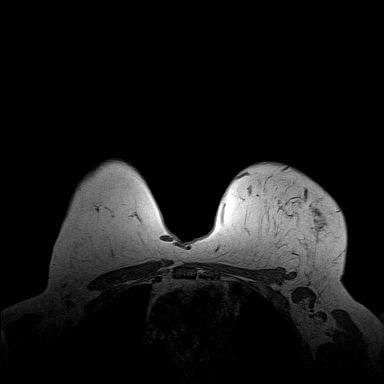
[im 144/144]
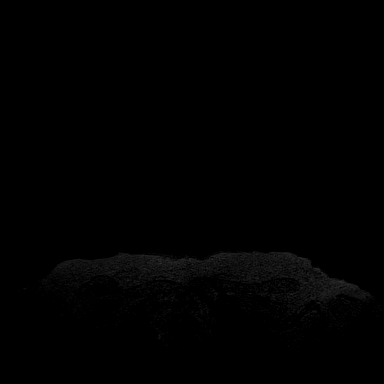

[Series 4: fl3d pre-cm · axial · non-contrast · 1.2mm · 0.94mm/px · z∈[-86,+85]mm · 5 of 144 slices shown]
[im 1/144]
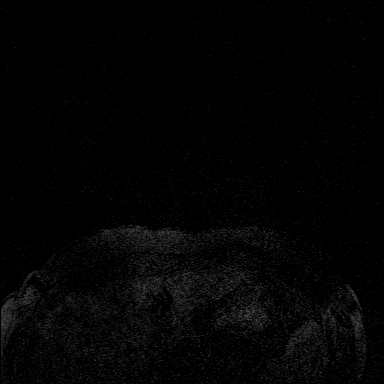
[im 36/144]
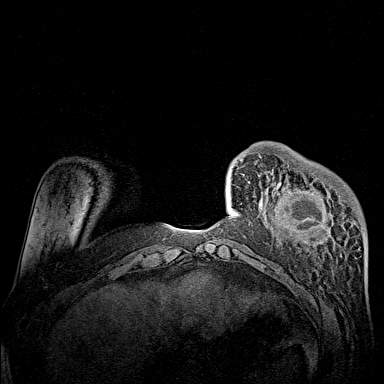
[im 72/144]
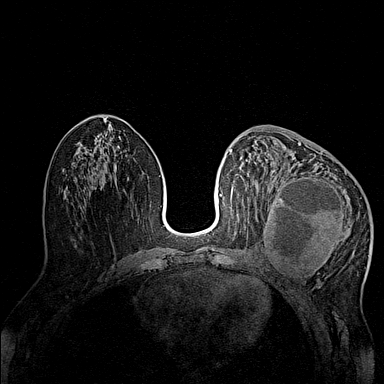
[im 108/144]
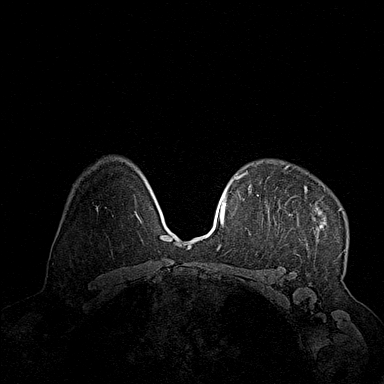
[im 144/144]
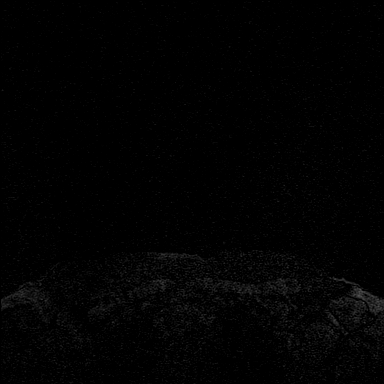

[Series 5: fl3d post-cm 20 · axial · 1.2mm · 0.94mm/px · z∈[-86,+85]mm · 5 of 144 slices shown (1 of 2)]
[im 1/144]
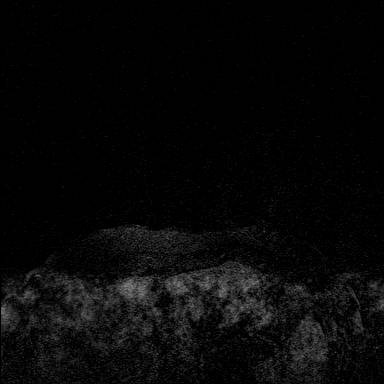
[im 36/144]
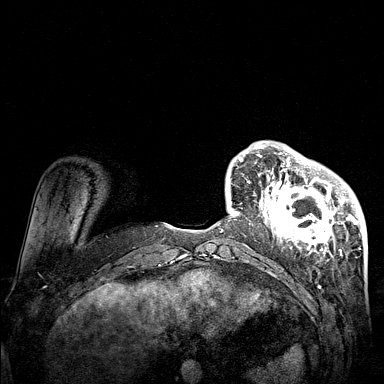
[im 72/144]
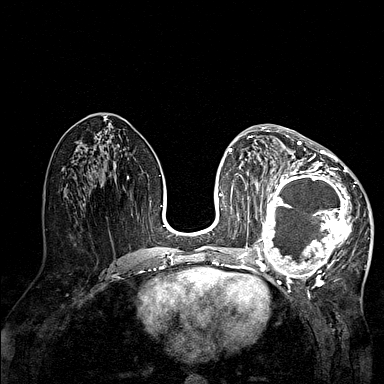
[im 108/144]
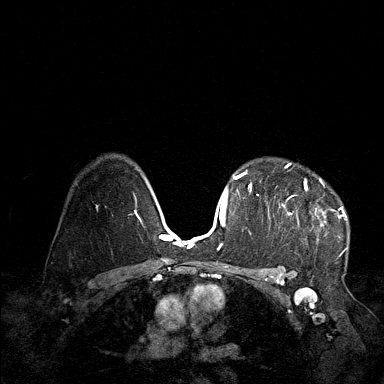
[im 144/144]
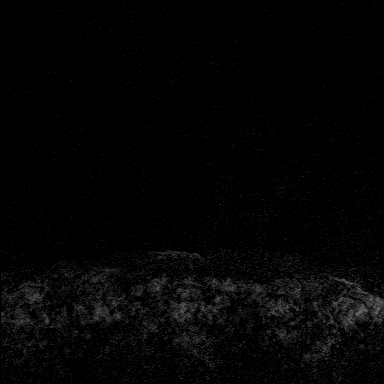

[Series 6: fl3d post-cm 20 · axial · 1.2mm · 0.94mm/px · z∈[-86,+85]mm · 5 of 144 slices shown (2 of 2)]
[im 1/144]
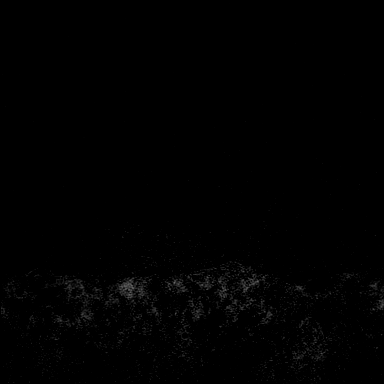
[im 36/144]
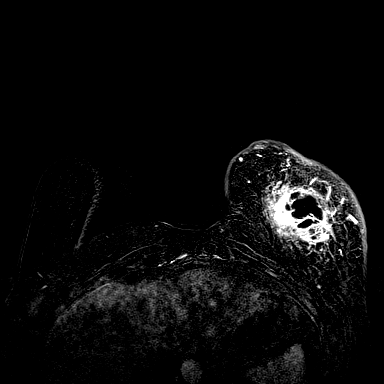
[im 72/144]
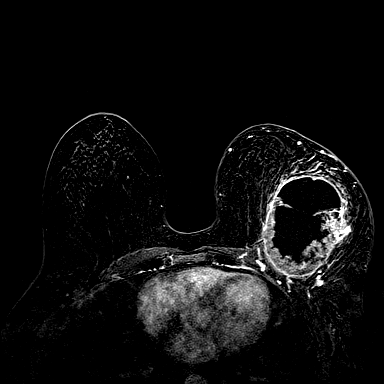
[im 108/144]
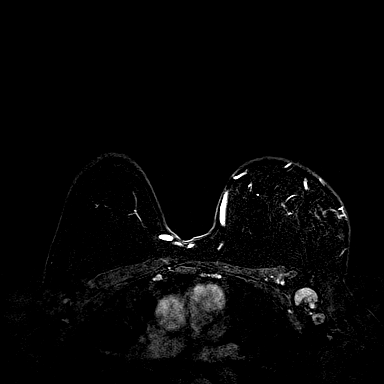
[im 144/144]
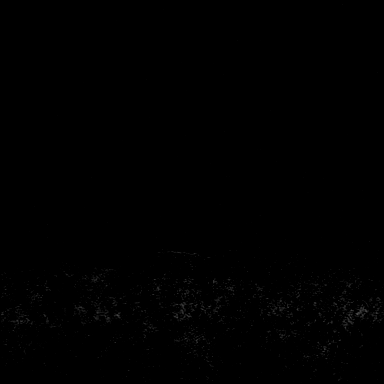

[Series 8: fl3d post-cm 3min · axial · 1.2mm · 0.94mm/px · z∈[-86,+85]mm · 6 of 144 slices shown]
[im 1/144]
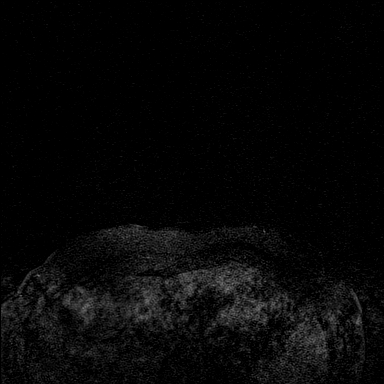
[im 29/144]
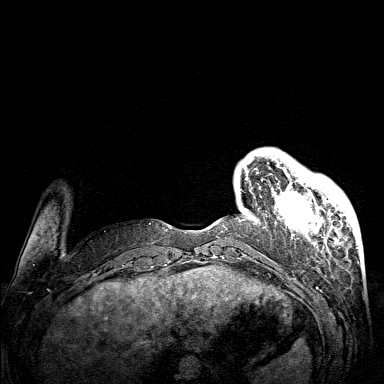
[im 58/144]
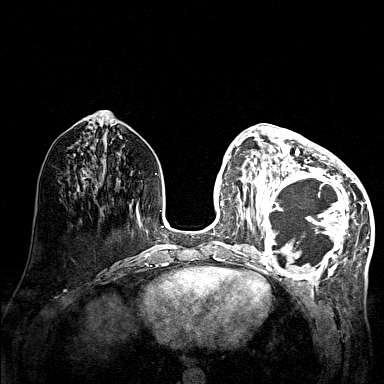
[im 86/144]
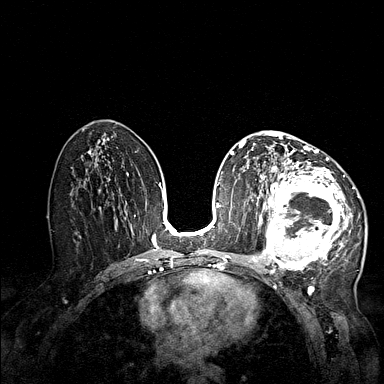
[im 115/144]
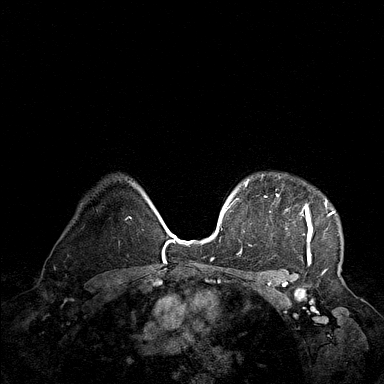
[im 144/144]
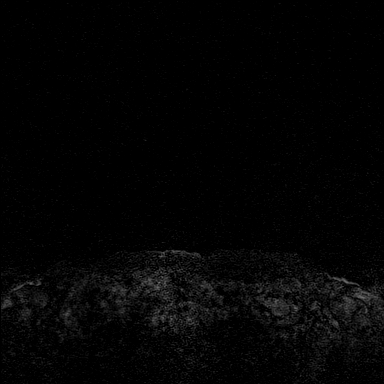

[Series 9: fl3d post-cm 3min_sub · axial · 1.2mm · 0.94mm/px · z∈[-86,+16]mm · 4 of 144 slices shown]
[im 1/144]
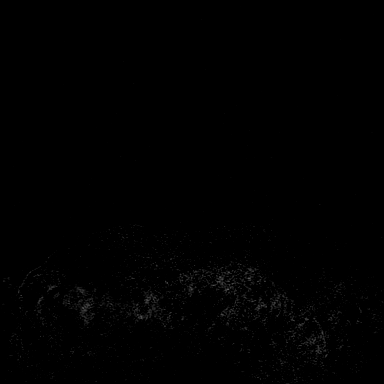
[im 29/144]
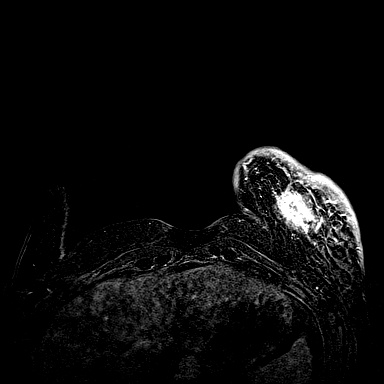
[im 58/144]
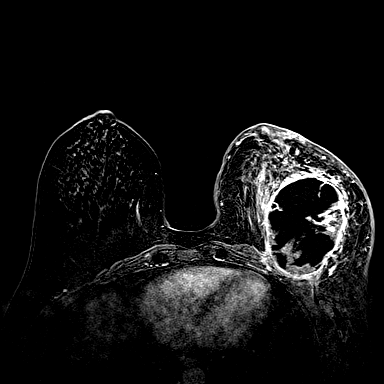
[im 86/144]
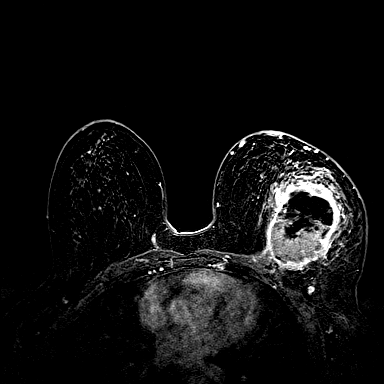

[31 of 48 positions shown; findings below may reference images not displayed]

Ultrasound-guided biopsy of a mass in the right breast at the [DATE]
position was benign with pathology demonstrating fibrocystic changes
with usual ductal hyperplasia. Stereotactic guided biopsy of an
additional mass in the upper-outer right breast reveals a benign
fibroadenoma.

LABS:  Not applicable.

EXAM:
BILATERAL BREAST MRI WITH AND WITHOUT CONTRAST
Three-dimensional MR images were rendered by post-processing of the
original MR data on an independent workstation. The
three-dimensional MR images were interpreted, and findings are
reported in the following complete MRI report for this study. Three
dimensional images were evaluated at the independent interpreting
workstation using the DynaCAD thin client.
FINDINGS: Breast composition: c.  Heterogeneous fibroglandular tissue.

Background parenchymal enhancement: Mild.

Right breast: Biopsy marking clips are present in the upper central
and upper outer right breast at sites of prior benign biopsies. No
suspicious enhancing masses or abnormal areas of enhancement
identified in the right breast to suggest malignancy.

Left breast: There is enlargement of the left breast with extensive
parenchymal and skin edema. There is an enhancing mixed cystic and
solid mass involving the outer left breast measuring 10 cm AP,
cm transverse and 8.5 cm craniocaudal. Biopsy marking clip is
present in the superolateral aspect of this mass. There is a thick
enhancing rim and enhancing septations, with the enhancing solid
frond like projections involving the periphery of the mass, mostly
posteriorly. There is thick linear enhancement extending from the
anterior portion of this mass to the level of the nipple measuring 5
cm AP. Together this linear enhancement with the mass measures
nearly 15 cm. This mass extends posterior to involve the subjacent
pectoralis muscle.

Lymph nodes: There are abnormal left level I and II axillary lymph
nodes, with multiple abnormal lymph nodes identified, the largest
lymph node measuring 2.3 x 1.3 cm (image 34). No abnormal internal
mammary lymph nodes identified.

Ancillary findings:  None.
IMPRESSION: 1. Large mixed cystic and solid enhancing mass involving the outer
left breast measures 10 x 7.3 x 8.5 cm and extends posteriorly to
involve the subjacent pectoralis muscle. Enhancement extending from
the anterior portion of this mass to the level of the nipple
measures 5 cm AP, with mass and linear enhancement all together
measuring approximately 15 cm.

2. Left axillary lymphadenopathy, with multiple abnormal left level
1 and 2 axillary lymph nodes compatible with nodal metastases.

3.  No MRI evidence of malignancy in the right breast.

RECOMMENDATION:
Recommend treatment plan for known left breast malignancy.

BI-RADS CATEGORY  6: Known biopsy-proven malignancy.

## 2021-05-27 MED ORDER — GADOBUTROL 1 MMOL/ML IV SOLN
7.0000 mL | Freq: Once | INTRAVENOUS | Status: AC | PRN
  Administered 2021-05-27: 7 mL via INTRAVENOUS

## 2021-05-28 ENCOUNTER — Encounter: Payer: Self-pay | Admitting: *Deleted

## 2021-05-28 ENCOUNTER — Inpatient Hospital Stay: Payer: 59

## 2021-05-28 NOTE — Progress Notes (Signed)
Pharmacist Chemotherapy Monitoring - Initial Assessment    Anticipated start date: 05/30/21  Regimen:  . Are orders appropriate based on the patient's diagnosis, regimen, and cycle? Yes . Does the plan date match the patient's scheduled date? Yes  . Is the sequencing of drugs appropriate? Yes . Are the premedications appropriate for the patient's regimen? Yes . Prior Authorization for treatment is: Approved o If applicable, is the correct biosimilar selected based on the patient's insurance? yes  Organ Function and Labs: Marland Kitchen Are dose adjustments needed based on the patient's renal function, hepatic function, or hematologic function? Yes . Are appropriate labs ordered prior to the start of patient's treatment? Yes . Other organ system assessment, if indicated: anthracyclines: Echo/ MUGA . The following baseline labs, if indicated, have been ordered: pembrolizumab: baseline TSH +/- T4  Dose Assessment: . Are the drug doses appropriate? Yes . Are the following correct: o Drug concentrations Yes o IV fluid compatible with drug Yes o Administration routes Yes o Timing of therapy Yes . If applicable, does the patient have documented access for treatment and/or plans for port-a-cath placement? yes . If applicable, have lifetime cumulative doses been properly documented and assessed? yes Lifetime Dose Tracking  No doses have been documented on this patient for the following tracked chemicals: Doxorubicin, Epirubicin, Idarubicin, Daunorubicin, Mitoxantrone, Bleomycin, Oxaliplatin, Carboplatin, Liposomal Doxorubicin  o   Toxicity Monitoring/Prevention: . The patient has the following take home antiemetics prescribed: Ondansetron, Prochlorperazine, Dexamethasone and Lorazepam . The patient has the following take home medications prescribed: N/A . Medication allergies and previous infusion related reactions, if applicable, have been reviewed and addressed. Yes . The patient's current medication  list has been assessed for drug-drug interactions with their chemotherapy regimen. no significant drug-drug interactions were identified on review.  Order Review: . Are the treatment plan orders signed? Yes . Is the patient scheduled to see a provider prior to their treatment? Yes  I verify that I have reviewed each item in the above checklist and answered each question accordingly.   Kennith Center, Pharm.D., CPP 05/28/2021@3 :13 PM

## 2021-05-28 NOTE — Progress Notes (Signed)
Patient Care Team: Mike Craze, DO as PCP - General (Internal Medicine) Mauro Kaufmann, RN as Oncology Nurse Navigator Rockwell Germany, RN as Oncology Nurse Navigator  DIAGNOSIS:    ICD-10-CM   1. Malignant neoplasm of upper-outer quadrant of left breast in female, estrogen receptor positive ([place])  C50.412    Z17.0     SUMMARY OF ONCOLOGIC HISTORY: Oncology History  Malignant neoplasm of upper-outer quadrant of left breast in female, estrogen receptor positive ([place])  05/06/2021 Initial Diagnosis   Large circumscribed multicystic mass left breast 9 to 10 cm with multiple left axillary lymph nodes with cortical thickening: biopsy 3 o'clock position: Grade 2 IDC, ER 10%, PR 0%, Ki-67 60%, HER2 negative Right breast: 0.4 cm mass at 9:30 position: Benign on biopsy   05/12/2021 Cancer Staging   Staging form: Breast, AJCC 8th Edition - Clinical stage from 05/12/2021: Stage IIIB (cT3, cN1, cM0, G2, ER-, PR-, HER2-) - Signed by Nicholas Lose, MD on 05/12/2021 Stage prefix: Initial diagnosis Histologic grading system: 3 grade system   05/30/2021 -  Chemotherapy    Patient is on Treatment Plan: BREAST PEMBROLIZUMAB + AC Q21D X 4 CYCLES FOLLOWED BY PEMBROLIZUMAB + CARBOPLATIN D1 + PACLITAXEL D1,8,15 Q21D X 4 CYCLES         CHIEF COMPLIANT: Cycle 1 Adriamycin, Cytoxan, Pembrolizumab  INTERVAL HISTORY: Sheena Mcdaniel is a 50 y.o. with above-mentioned history of left breast cancer currently on neoadjuvant chemotherapy with Adriamycin and Cytoxan along with pembrolizumab. Her port was placed by Dr. Marlou Starks on 05/12/21. She presents to the clinic today for treatment.  Fortunately the scans do not show any evidence of distant metastatic disease.  She has extensive amount of cancer standing from the nipple to the chest wall.  ALLERGIES:  has No Known Allergies.  MEDICATIONS:  Current Outpatient Medications  Medication Sig Dispense Refill  . dexamethasone (DECADRON) 4 MG tablet Take 1 tablet  day after chemo and 1 tablet 2 days after chemo with food 8 tablet 0  . HYDROcodone-acetaminophen (NORCO/VICODIN) 5-325 MG tablet Take 1-2 tablets by mouth every 6 (six) hours as needed for moderate pain or severe pain. 10 tablet 0  . lidocaine-prilocaine (EMLA) cream Apply to affected area once 30 g 3  . LORazepam (ATIVAN) 0.5 MG tablet Take 1 tablet (0.5 mg total) by mouth at bedtime as needed for sleep. 30 tablet 0  . ondansetron (ZOFRAN) 8 MG tablet Take 1 tablet (8 mg total) by mouth 2 (two) times daily as needed. Start on the third day after carboplatin and AC chemotherapy. 30 tablet 1  . prochlorperazine (COMPAZINE) 10 MG tablet Take 1 tablet (10 mg total) by mouth every 6 (six) hours as needed (Nausea or vomiting). 30 tablet 1  . traZODone (DESYREL) 50 MG tablet Take 1 tablet (50 mg total) by mouth at bedtime. 30 tablet 1   No current facility-administered medications for this visit.   Facility-Administered Medications Ordered in Other Visits  Medication Dose Route Frequency Provider Last Rate Last Admin  . sodium chloride flush (NS) 0.9 % injection 10 mL  10 mL Intracatheter PRN Nicholas Lose, MD        PHYSICAL EXAMINATION: ECOG PERFORMANCE STATUS: 1 - Symptomatic but completely ambulatory  There were no vitals filed for this visit. There were no vitals filed for this visit.  LABORATORY DATA:  I have reviewed the data as listed No flowsheet data found.  Lab Results  Component Value Date   WBC 10.5 05/29/2021  HGB 12.1 05/29/2021   HCT 36.9 05/29/2021   MCV 84.6 05/29/2021   PLT 404 (H) 05/29/2021   NEUTROABS 8.0 (H) 05/29/2021    ASSESSMENT & PLAN:  Malignant neoplasm of upper-outer quadrant of left breast in female, estrogen receptor positive ([place]) 05/06/2021: Large circumscribed multicystic mass left breast 9 to 10 cm with multiple left axillary lymph nodes with cortical thickening: biopsy 3 o'clock position: Grade 2 IDC, ER 10%, PR 0%, Ki-67 60%, HER2 negative Right  breast: 0.4 cm mass at 9:30 position: Benign on biopsy  Treatment plan: 1.  Neoadjuvant chemotherapy with Adriamycin and Cytoxan along with pembrolizumab followed by Taxol carboplatin and pembrolizumab (pembrolizumab maintenance for 1 year) 2. mastectomy with axillary lymph node dissection 3.  Adjuvant radiation 4.  Followed by adjuvant antiestrogen therapy (since she is 10% ER positive) ------------------------------------------------------------------------------------------------------------------------------------------------------ Current treatment: Cycle 1 day 1 Adriamycin and Cytoxan with pembrolizumab (this will start tomorrow) 05/20/2021 echocardiogram EF 60 to 65% 05/20/2021: CT CAP: Large left breast mass with deep involvement of left pectoralis muscle and overlying skin thickening, mild left axillary adenopathy.  No metastatic disease elsewhere 05/21/2021: Bone scan: No bone metastases 05/28/2021: Breast MRI: Large mixed cystic and solid enhancing mass involving outer left breast 10 x 7.3 x 8.5 cm posterior involving pectoralis muscle extends to the nipple mass and enhancement together 15 cm.  Left axillary lymphadenopathy.  Multiple  Labs reviewed, chemo education completed, chemo consent obtained, antiemetics reviewed Return to clinic in 1 week for toxicity check    No orders of the defined types were placed in this encounter.  The patient has a good understanding of the overall plan. she agrees with it. she will call with any problems that may develop before the next visit here.  Total time spent: 30 mins including face to face time and time spent for planning, charting and coordination of care  Rulon Eisenmenger, MD, MPH 05/29/2021  I, Cloyde Reams Dorshimer, am acting as scribe for Dr. Nicholas Lose.  I have reviewed the above documentation for accuracy and completeness, and I agree with the above.

## 2021-05-29 ENCOUNTER — Inpatient Hospital Stay (HOSPITAL_BASED_OUTPATIENT_CLINIC_OR_DEPARTMENT_OTHER): Payer: 59 | Admitting: Hematology and Oncology

## 2021-05-29 ENCOUNTER — Ambulatory Visit: Payer: Self-pay | Admitting: Plastic Surgery

## 2021-05-29 ENCOUNTER — Inpatient Hospital Stay: Payer: 59

## 2021-05-29 ENCOUNTER — Other Ambulatory Visit: Payer: Self-pay

## 2021-05-29 ENCOUNTER — Inpatient Hospital Stay: Payer: 59 | Attending: Hematology and Oncology

## 2021-05-29 DIAGNOSIS — C50412 Malignant neoplasm of upper-outer quadrant of left female breast: Secondary | ICD-10-CM

## 2021-05-29 DIAGNOSIS — Z7952 Long term (current) use of systemic steroids: Secondary | ICD-10-CM | POA: Diagnosis not present

## 2021-05-29 DIAGNOSIS — Z79899 Other long term (current) drug therapy: Secondary | ICD-10-CM | POA: Insufficient documentation

## 2021-05-29 DIAGNOSIS — C773 Secondary and unspecified malignant neoplasm of axilla and upper limb lymph nodes: Secondary | ICD-10-CM | POA: Diagnosis not present

## 2021-05-29 DIAGNOSIS — Z5111 Encounter for antineoplastic chemotherapy: Secondary | ICD-10-CM | POA: Insufficient documentation

## 2021-05-29 DIAGNOSIS — Z170 Estrogen receptor positive status [ER+]: Secondary | ICD-10-CM

## 2021-05-29 DIAGNOSIS — Z5112 Encounter for antineoplastic immunotherapy: Secondary | ICD-10-CM | POA: Insufficient documentation

## 2021-05-29 DIAGNOSIS — Z5189 Encounter for other specified aftercare: Secondary | ICD-10-CM | POA: Diagnosis not present

## 2021-05-29 DIAGNOSIS — Z95828 Presence of other vascular implants and grafts: Secondary | ICD-10-CM

## 2021-05-29 LAB — CBC WITH DIFFERENTIAL (CANCER CENTER ONLY)
Abs Immature Granulocytes: 0.07 10*3/uL (ref 0.00–0.07)
Basophils Absolute: 0.1 10*3/uL (ref 0.0–0.1)
Basophils Relative: 1 %
Eosinophils Absolute: 0.1 10*3/uL (ref 0.0–0.5)
Eosinophils Relative: 1 %
HCT: 36.9 % (ref 36.0–46.0)
Hemoglobin: 12.1 g/dL (ref 12.0–15.0)
Immature Granulocytes: 1 %
Lymphocytes Relative: 13 %
Lymphs Abs: 1.4 10*3/uL (ref 0.7–4.0)
MCH: 27.8 pg (ref 26.0–34.0)
MCHC: 32.8 g/dL (ref 30.0–36.0)
MCV: 84.6 fL (ref 80.0–100.0)
Monocytes Absolute: 0.9 10*3/uL (ref 0.1–1.0)
Monocytes Relative: 8 %
Neutro Abs: 8 10*3/uL — ABNORMAL HIGH (ref 1.7–7.7)
Neutrophils Relative %: 76 %
Platelet Count: 404 10*3/uL — ABNORMAL HIGH (ref 150–400)
RBC: 4.36 MIL/uL (ref 3.87–5.11)
RDW: 13.2 % (ref 11.5–15.5)
WBC Count: 10.5 10*3/uL (ref 4.0–10.5)
nRBC: 0 % (ref 0.0–0.2)

## 2021-05-29 LAB — CMP (CANCER CENTER ONLY)
ALT: 18 U/L (ref 0–44)
AST: 25 U/L (ref 15–41)
Albumin: 3.7 g/dL (ref 3.5–5.0)
Alkaline Phosphatase: 67 U/L (ref 38–126)
Anion gap: 12 (ref 5–15)
BUN: 13 mg/dL (ref 6–20)
CO2: 26 mmol/L (ref 22–32)
Calcium: 9.9 mg/dL (ref 8.9–10.3)
Chloride: 103 mmol/L (ref 98–111)
Creatinine: 0.79 mg/dL (ref 0.44–1.00)
GFR, Estimated: >60 mL/min (ref >60)
Glucose, Bld: 97 mg/dL (ref 70–99)
Potassium: 4.2 mmol/L (ref 3.5–5.1)
Sodium: 141 mmol/L (ref 135–145)
Total Bilirubin: 0.4 mg/dL (ref 0.3–1.2)
Total Protein: 7.6 g/dL (ref 6.5–8.1)

## 2021-05-29 LAB — TSH: TSH: 1.875 u[IU]/mL (ref 0.308–3.960)

## 2021-05-29 MED ORDER — SODIUM CHLORIDE 0.9% FLUSH
10.0000 mL | INTRAVENOUS | Status: DC | PRN
  Filled 2021-05-29: qty 10

## 2021-05-29 NOTE — Assessment & Plan Note (Signed)
05/06/2021: Large circumscribed multicystic mass left breast 9 to 10 cm with multiple left axillary lymph nodes with cortical thickening: biopsy 3 o'clock position: Grade 2 IDC, ER 10%, PR 0%, Ki-67 60%, HER2 negative Right breast: 0.4 cm mass at 9:30 position: Benign on biopsy  Treatment plan: 1.  Neoadjuvant chemotherapy with Adriamycin and Cytoxan along with pembrolizumab followed by Taxol carboplatin and pembrolizumab (pembrolizumab maintenance for 1 year) 2. mastectomy with axillary lymph node dissection 3.  Adjuvant radiation 4.  Followed by adjuvant antiestrogen therapy (since she is 10% ER positive) ------------------------------------------------------------------------------------------------------------------------------------------------------ Current treatment: Cycle 1 day 1 Adriamycin and Cytoxan with pembrolizumab 05/20/2021 echocardiogram EF 60 to 65% 05/20/2021: CT CAP: Large left breast mass with deep involvement of left pectoralis muscle and overlying skin thickening, mild left axillary adenopathy.  No metastatic disease elsewhere 05/21/2021: Bone scan: No bone metastases 05/28/2021: Breast MRI: Large mixed cystic and solid enhancing mass involving outer left breast 10 x 7.3 x 8.5 cm posterior involving pectoralis muscle extends to the nipple mass and enhancement together 15 cm.  Left axillary lymphadenopathy.  Multiple  Labs reviewed, chemo education completed, chemo consent obtained, antiemetics reviewed Return to clinic in 1 week for toxicity check

## 2021-05-29 NOTE — Progress Notes (Signed)
Pt preferred to have labs drawn peripherally b/c she wasn't aware she was getting accessed today and states she was not prepared and didn't have the numbing cream on.

## 2021-05-30 ENCOUNTER — Other Ambulatory Visit: Payer: Self-pay

## 2021-05-30 ENCOUNTER — Inpatient Hospital Stay: Payer: 59

## 2021-05-30 VITALS — BP 127/82 | HR 95 | Temp 97.6°F | Resp 18

## 2021-05-30 DIAGNOSIS — Z5112 Encounter for antineoplastic immunotherapy: Secondary | ICD-10-CM | POA: Diagnosis not present

## 2021-05-30 LAB — T4: T4, Total: 11.1 ug/dL (ref 4.5–12.0)

## 2021-05-30 MED ORDER — SODIUM CHLORIDE 0.9 % IV SOLN
Freq: Once | INTRAVENOUS | Status: AC
  Filled 2021-05-30: qty 250

## 2021-05-30 MED ORDER — HEPARIN SOD (PORK) LOCK FLUSH 100 UNIT/ML IV SOLN
500.0000 [IU] | Freq: Once | INTRAVENOUS | Status: AC | PRN
  Administered 2021-05-30: 500 [IU]
  Filled 2021-05-30: qty 5

## 2021-05-30 MED ORDER — SODIUM CHLORIDE 0.9 % IV SOLN
200.0000 mg | Freq: Once | INTRAVENOUS | Status: AC
  Administered 2021-05-30: 200 mg via INTRAVENOUS
  Filled 2021-05-30: qty 8

## 2021-05-30 MED ORDER — CYCLOPHOSPHAMIDE CHEMO INJECTION 1 GM
600.0000 mg/m2 | Freq: Once | INTRAMUSCULAR | Status: AC
  Administered 2021-05-30: 1040 mg via INTRAVENOUS
  Filled 2021-05-30: qty 52

## 2021-05-30 MED ORDER — SODIUM CHLORIDE 0.9% FLUSH
10.0000 mL | INTRAVENOUS | Status: DC | PRN
  Administered 2021-05-30: 10 mL
  Filled 2021-05-30: qty 10

## 2021-05-30 MED ORDER — DOXORUBICIN HCL CHEMO IV INJECTION 2 MG/ML
60.0000 mg/m2 | Freq: Once | INTRAVENOUS | Status: AC
  Administered 2021-05-30: 104 mg via INTRAVENOUS
  Filled 2021-05-30: qty 52

## 2021-05-30 MED ORDER — PALONOSETRON HCL INJECTION 0.25 MG/5ML
0.2500 mg | Freq: Once | INTRAVENOUS | Status: AC
  Administered 2021-05-30: 0.25 mg via INTRAVENOUS

## 2021-05-30 MED ORDER — FOSAPREPITANT DIMEGLUMINE INJECTION 150 MG
150.0000 mg | Freq: Once | INTRAVENOUS | Status: AC
  Administered 2021-05-30: 150 mg via INTRAVENOUS
  Filled 2021-05-30: qty 150

## 2021-05-30 MED ORDER — SODIUM CHLORIDE 0.9 % IV SOLN
10.0000 mg | Freq: Once | INTRAVENOUS | Status: AC
  Administered 2021-05-30: 10 mg via INTRAVENOUS
  Filled 2021-05-30: qty 10

## 2021-05-30 NOTE — Patient Instructions (Signed)
[facility]  Discharge Instructions: Thank you for choosing [facility] to provide your oncology and hematology care.   If you have a lab appointment with the [facility], please go directly to the [facility] and check in at the registration area.   Wear comfortable clothing and clothing appropriate for easy access to any Portacath or PICC line.   We strive to give you quality time with your provider. You may need to reschedule your appointment if you arrive late (15 or more minutes).  Arriving late affects you and other patients whose appointments are after yours.  Also, if you miss three or more appointments without notifying the office, you may be dismissed from the clinic at the provider's discretion.      For prescription refill requests, have your pharmacy contact our office and allow 72 hours for refills to be completed.    Today you received the following chemotherapy and/or immunotherapy agents: Keytruda, Adriamycin and Cytoxan.   To help prevent nausea and vomiting after your treatment, we encourage you to take your nausea medication as directed.  BELOW ARE SYMPTOMS THAT SHOULD BE REPORTED IMMEDIATELY: . *FEVER GREATER THAN 100.4 F (38 C) OR HIGHER . *CHILLS OR SWEATING . *NAUSEA AND VOMITING THAT IS NOT CONTROLLED WITH YOUR NAUSEA MEDICATION . *UNUSUAL SHORTNESS OF BREATH . *UNUSUAL BRUISING OR BLEEDING . *URINARY PROBLEMS (pain or burning when urinating, or frequent urination) . *BOWEL PROBLEMS (unusual diarrhea, constipation, pain near the anus) . TENDERNESS IN MOUTH AND THROAT WITH OR WITHOUT PRESENCE OF ULCERS (sore throat, sores in mouth, or a toothache) . UNUSUAL RASH, SWELLING OR PAIN  . UNUSUAL VAGINAL DISCHARGE OR ITCHING   Items with * indicate a potential emergency and should be followed up as soon as possible or go to the Emergency Department if any problems should occur.  Please show the CHEMOTHERAPY ALERT CARD or  IMMUNOTHERAPY ALERT CARD at check-in to the Emergency Department and triage nurse.  Should you have questions after your visit or need to cancel or reschedule your appointment, please contact [facility]  Dept: [phone]  and follow the prompts.  Office hours are 8:00 a.m. to 4:30 p.m. Monday - Friday. Please note that voicemails left after 4:00 p.m. may not be returned until the following business day.  We are closed weekends and major holidays. You have access to a nurse at all times for urgent questions. Please call the main number to the clinic Dept: [phone] and follow the prompts.   For any non-urgent questions, you may also contact your provider using MyChart. We now offer e-Visits for anyone 23 and older to request care online for non-urgent symptoms. For details visit [email or web address].   Also download the MyChart app! Go to the app store, search "MyChart", open the app, select [facility], and log in with your MyChart username and password.  Due to Covid, a mask is required upon entering the hospital/clinic. If you do not have a mask, one will be given to you upon arrival. For doctor visits, patients may have 1 support person aged 57 or older with them. For treatment visits, patients cannot have anyone with them due to current Covid guidelines and our immunocompromised population.  Pembrolizumab injection What is this medicine? PEMBROLIZUMAB (pem broe liz ue mab) is a monoclonal antibody. It is used to treat certain types of cancer. This medicine may be used for other purposes; ask your health care provider or pharmacist  if you have questions. COMMON BRAND NAME(S): Keytruda What should I tell my health care provider before I take this medicine? They need to know if you have any of these conditions:  autoimmune diseases like Crohn's disease, ulcerative colitis, or lupus  have had or planning to have an allogeneic stem cell transplant (uses  someone else's stem cells)  history of organ transplant  history of chest radiation  nervous system problems like myasthenia gravis or Guillain-Barre syndrome  an unusual or allergic reaction to pembrolizumab, other medicines, foods, dyes, or preservatives  pregnant or trying to get pregnant  breast-feeding How should I use this medicine? This medicine is for infusion into a vein. It is given by a health care professional in a hospital or clinic setting. A special MedGuide will be given to you before each treatment. Be sure to read this information carefully each time. Talk to your pediatrician regarding the use of this medicine in children. While this drug may be prescribed for children as young as 6 months for selected conditions, precautions do apply. Overdosage: If you think you have taken too much of this medicine contact a poison control center or emergency room at once. NOTE: This medicine is only for you. Do not share this medicine with others. What if I miss a dose? It is important not to miss your dose. Call your doctor or health care professional if you are unable to keep an appointment. What may interact with this medicine? Interactions have not been studied. This list may not describe all possible interactions. Give your health care provider a list of all the medicines, herbs, non-prescription drugs, or dietary supplements you use. Also tell them if you smoke, drink alcohol, or use illegal drugs. Some items may interact with your medicine. What should I watch for while using this medicine? Your condition will be monitored carefully while you are receiving this medicine. You may need blood work done while you are taking this medicine. Do not become pregnant while taking this medicine or for 4 months after stopping it. Women should inform their doctor if they wish to become pregnant or think they might be pregnant. There is a potential for serious side effects to an unborn  child. Talk to your health care professional or pharmacist for more information. Do not breast-feed an infant while taking this medicine or for 4 months after the last dose. What side effects may I notice from receiving this medicine? Side effects that you should report to your doctor or health care professional as soon as possible:  allergic reactions like skin rash, itching or hives, swelling of the face, lips, or tongue  bloody or black, tarry  breathing problems  changes in vision  chest pain  chills  confusion  constipation  cough  diarrhea  dizziness or feeling faint or lightheaded  fast or irregular heartbeat  fever  flushing  joint pain  low blood counts - this medicine may decrease the number of white blood cells, red blood cells and platelets. You may be at increased risk for infections and bleeding.  muscle pain  muscle weakness  pain, tingling, numbness in the hands or feet  persistent headache  redness, blistering, peeling or loosening of the skin, including inside the mouth  signs and symptoms of high blood sugar such as dizziness; dry mouth; dry skin; fruity breath; nausea; stomach pain; increased hunger or thirst; increased urination  signs and symptoms of kidney injury like trouble passing urine or change in the amount  of urine  signs and symptoms of liver injury like dark urine, light-colored stools, loss of appetite, nausea, right upper belly pain, yellowing of the eyes or skin  sweating  swollen lymph nodes  weight loss Side effects that usually do not require medical attention (report to your doctor or health care professional if they continue or are bothersome):  decreased appetite  hair loss  tiredness This list may not describe all possible side effects. Call your doctor for medical advice about side effects. You may report side effects to FDA at 1-800-FDA-1088. Where should I keep my medicine? This drug is given in a hospital  or clinic and will not be stored at home. NOTE: This sheet is a summary. It may not cover all possible information. If you have questions about this medicine, talk to your doctor, pharmacist, or health care provider.  2021 Elsevier/Gold Standard (2019-11-14 21:44:53) Doxorubicin injection What is this medicine? DOXORUBICIN (dox oh ROO bi sin) is a chemotherapy drug. It is used to treat many kinds of cancer like leukemia, lymphoma, neuroblastoma, sarcoma, and Wilms' tumor. It is also used to treat bladder cancer, breast cancer, lung cancer, ovarian cancer, stomach cancer, and thyroid cancer. This medicine may be used for other purposes; ask your health care provider or pharmacist if you have questions. COMMON BRAND NAME(S): Adriamycin, Adriamycin PFS, Adriamycin RDF, Rubex What should I tell my health care provider before I take this medicine? They need to know if you have any of these conditions:  heart disease  history of low blood counts caused by a medicine  liver disease  recent or ongoing radiation therapy  an unusual or allergic reaction to doxorubicin, other chemotherapy agents, other medicines, foods, dyes, or preservatives  pregnant or trying to get pregnant  breast-feeding How should I use this medicine? This drug is given as an infusion into a vein. It is administered in a hospital or clinic by a specially trained health care professional. If you have pain, swelling, burning or any unusual feeling around the site of your injection, tell your health care professional right away. Talk to your pediatrician regarding the use of this medicine in children. Special care may be needed. Overdosage: If you think you have taken too much of this medicine contact a poison control center or emergency room at once. NOTE: This medicine is only for you. Do not share this medicine with others. What if I miss a dose? It is important not to miss your dose. Call your doctor or health care  professional if you are unable to keep an appointment. What may interact with this medicine? This medicine may interact with the following medications:  6-mercaptopurine  paclitaxel  phenytoin  St. John's Wort  trastuzumab  verapamil This list may not describe all possible interactions. Give your health care provider a list of all the medicines, herbs, non-prescription drugs, or dietary supplements you use. Also tell them if you smoke, drink alcohol, or use illegal drugs. Some items may interact with your medicine. What should I watch for while using this medicine? This drug may make you feel generally unwell. This is not uncommon, as chemotherapy can affect healthy cells as well as cancer cells. Report any side effects. Continue your course of treatment even though you feel ill unless your doctor tells you to stop. There is a maximum amount of this medicine you should receive throughout your life. The amount depends on the medical condition being treated and your overall health. Your doctor will watch  how much of this medicine you receive in your lifetime. Tell your doctor if you have taken this medicine before. You may need blood work done while you are taking this medicine. Your urine may turn red for a few days after your dose. This is not blood. If your urine is dark or brown, call your doctor. In some cases, you may be given additional medicines to help with side effects. Follow all directions for their use. Call your doctor or health care professional for advice if you get a fever, chills or sore throat, or other symptoms of a cold or flu. Do not treat yourself. This drug decreases your body's ability to fight infections. Try to avoid being around people who are sick. This medicine may increase your risk to bruise or bleed. Call your doctor or health care professional if you notice any unusual bleeding. Talk to your doctor about your risk of cancer. You may be more at risk for certain  types of cancers if you take this medicine. Do not become pregnant while taking this medicine or for 6 months after stopping it. Women should inform their doctor if they wish to become pregnant or think they might be pregnant. Men should not father a child while taking this medicine and for 6 months after stopping it. There is a potential for serious side effects to an unborn child. Talk to your health care professional or pharmacist for more information. Do not breast-feed an infant while taking this medicine. This medicine has caused ovarian failure in some women and reduced sperm counts in some men This medicine may interfere with the ability to have a child. Talk with your doctor or health care professional if you are concerned about your fertility. This medicine may cause a decrease in Co-Enzyme Q-10. You should make sure that you get enough Co-Enzyme Q-10 while you are taking this medicine. Discuss the foods you eat and the vitamins you take with your health care professional. What side effects may I notice from receiving this medicine? Side effects that you should report to your doctor or health care professional as soon as possible:  allergic reactions like skin rash, itching or hives, swelling of the face, lips, or tongue  breathing problems  chest pain  fast or irregular heartbeat  low blood counts - this medicine may decrease the number of white blood cells, red blood cells and platelets. You may be at increased risk for infections and bleeding.  pain, redness, or irritation at site where injected  signs of infection - fever or chills, cough, sore throat, pain or difficulty passing urine  signs of decreased platelets or bleeding - bruising, pinpoint red spots on the skin, black, tarry stools, blood in the urine  swelling of the ankles, feet, hands  tiredness  weakness Side effects that usually do not require medical attention (report to your doctor or health care professional  if they continue or are bothersome):  diarrhea  hair loss  mouth sores  nail discoloration or damage  nausea  red colored urine  vomiting This list may not describe all possible side effects. Call your doctor for medical advice about side effects. You may report side effects to FDA at 1-800-FDA-1088. Where should I keep my medicine? This drug is given in a hospital or clinic and will not be stored at home. NOTE: This sheet is a summary. It may not cover all possible information. If you have questions about this medicine, talk to your doctor, pharmacist, or  health care provider.  2021 Elsevier/Gold Standard (2017-07-27 11:01:26) Cyclophosphamide Injection What is this medicine? CYCLOPHOSPHAMIDE (sye kloe FOSS fa mide) is a chemotherapy drug. It slows the growth of cancer cells. This medicine is used to treat many types of cancer like lymphoma, myeloma, leukemia, breast cancer, and ovarian cancer, to name a few. This medicine may be used for other purposes; ask your health care provider or pharmacist if you have questions. COMMON BRAND NAME(S): Cytoxan, Neosar What should I tell my health care provider before I take this medicine? They need to know if you have any of these conditions:  heart disease  history of irregular heartbeat  infection  kidney disease  liver disease  low blood counts, like white cells, platelets, or red blood cells  on hemodialysis  recent or ongoing radiation therapy  scarring or thickening of the lungs  trouble passing urine  an unusual or allergic reaction to cyclophosphamide, other medicines, foods, dyes, or preservatives  pregnant or trying to get pregnant  breast-feeding How should I use this medicine? This drug is usually given as an injection into a vein or muscle or by infusion into a vein. It is administered in a hospital or clinic by a specially trained health care professional. Talk to your pediatrician regarding the use of this  medicine in children. Special care may be needed. Overdosage: If you think you have taken too much of this medicine contact a poison control center or emergency room at once. NOTE: This medicine is only for you. Do not share this medicine with others. What if I miss a dose? It is important not to miss your dose. Call your doctor or health care professional if you are unable to keep an appointment. What may interact with this medicine?  amphotericin B  azathioprine  certain antivirals for HIV or hepatitis  certain medicines for blood pressure, heart disease, irregular heart beat  certain medicines that treat or prevent blood clots like warfarin  certain other medicines for cancer  cyclosporine  etanercept  indomethacin  medicines that relax muscles for surgery  medicines to increase blood counts  metronidazole This list may not describe all possible interactions. Give your health care provider a list of all the medicines, herbs, non-prescription drugs, or dietary supplements you use. Also tell them if you smoke, drink alcohol, or use illegal drugs. Some items may interact with your medicine. What should I watch for while using this medicine? Your condition will be monitored carefully while you are receiving this medicine. You may need blood work done while you are taking this medicine. Drink water or other fluids as directed. Urinate often, even at night. Some products may contain alcohol. Ask your health care professional if this medicine contains alcohol. Be sure to tell all health care professionals you are taking this medicine. Certain medicines, like metronidazole and disulfiram, can cause an unpleasant reaction when taken with alcohol. The reaction includes flushing, headache, nausea, vomiting, sweating, and increased thirst. The reaction can last from 30 minutes to several hours. Do not become pregnant while taking this medicine or for 1 year after stopping it. Women should  inform their health care professional if they wish to become pregnant or think they might be pregnant. Men should not father a child while taking this medicine and for 4 months after stopping it. There is potential for serious side effects to an unborn child. Talk to your health care professional for more information. Do not breast-feed an infant while taking this medicine  or for 1 week after stopping it. This medicine has caused ovarian failure in some women. This medicine may make it more difficult to get pregnant. Talk to your health care professional if you are concerned about your fertility. This medicine has caused decreased sperm counts in some men. This may make it more difficult to father a child. Talk to your health care professional if you are concerned about your fertility. Call your health care professional for advice if you get a fever, chills, or sore throat, or other symptoms of a cold or flu. Do not treat yourself. This medicine decreases your body's ability to fight infections. Try to avoid being around people who are sick. Avoid taking medicines that contain aspirin, acetaminophen, ibuprofen, naproxen, or ketoprofen unless instructed by your health care professional. These medicines may hide a fever. Talk to your health care professional about your risk of cancer. You may be more at risk for certain types of cancer if you take this medicine. If you are going to need surgery or other procedure, tell your health care professional that you are using this medicine. Be careful brushing or flossing your teeth or using a toothpick because you may get an infection or bleed more easily. If you have any dental work done, tell your dentist you are receiving this medicine. What side effects may I notice from receiving this medicine? Side effects that you should report to your doctor or health care professional as soon as possible:  allergic reactions like skin rash, itching or hives, swelling of  the face, lips, or tongue  breathing problems  nausea, vomiting  signs and symptoms of bleeding such as bloody or black, tarry stools; red or dark brown urine; spitting up blood or brown material that looks like coffee grounds; red spots on the skin; unusual bruising or bleeding from the eyes, gums, or nose  signs and symptoms of heart failure like fast, irregular heartbeat, sudden weight gain; swelling of the ankles, feet, hands  signs and symptoms of infection like fever; chills; cough; sore throat; pain or trouble passing urine  signs and symptoms of kidney injury like trouble passing urine or change in the amount of urine  signs and symptoms of liver injury like dark yellow or brown urine; general ill feeling or flu-like symptoms; light-colored stools; loss of appetite; nausea; right upper belly pain; unusually weak or tired; yellowing of the eyes or skin Side effects that usually do not require medical attention (report to your doctor or health care professional if they continue or are bothersome):  confusion  decreased hearing  diarrhea  facial flushing  hair loss  headache  loss of appetite  missed menstrual periods  signs and symptoms of low red blood cells or anemia such as unusually weak or tired; feeling faint or lightheaded; falls  skin discoloration This list may not describe all possible side effects. Call your doctor for medical advice about side effects. You may report side effects to FDA at 1-800-FDA-1088. Where should I keep my medicine? This drug is given in a hospital or clinic and will not be stored at home. NOTE: This sheet is a summary. It may not cover all possible information. If you have questions about this medicine, talk to your doctor, pharmacist, or health care provider.  2021 Elsevier/Gold Standard (2019-09-17 09:53:29)

## 2021-06-01 ENCOUNTER — Other Ambulatory Visit: Payer: Self-pay

## 2021-06-01 ENCOUNTER — Inpatient Hospital Stay: Payer: 59

## 2021-06-01 ENCOUNTER — Telehealth: Payer: Self-pay | Admitting: *Deleted

## 2021-06-01 VITALS — BP 126/93 | HR 93 | Temp 98.2°F | Resp 18

## 2021-06-01 DIAGNOSIS — Z5112 Encounter for antineoplastic immunotherapy: Secondary | ICD-10-CM | POA: Diagnosis not present

## 2021-06-01 DIAGNOSIS — C50412 Malignant neoplasm of upper-outer quadrant of left female breast: Secondary | ICD-10-CM

## 2021-06-01 DIAGNOSIS — Z170 Estrogen receptor positive status [ER+]: Secondary | ICD-10-CM

## 2021-06-01 MED ORDER — PEGFILGRASTIM-CBQV 6 MG/0.6ML ~~LOC~~ SOSY
6.0000 mg | PREFILLED_SYRINGE | Freq: Once | SUBCUTANEOUS | Status: AC
  Administered 2021-06-01: 6 mg via SUBCUTANEOUS

## 2021-06-01 MED ORDER — PEGFILGRASTIM-CBQV 6 MG/0.6ML ~~LOC~~ SOSY
PREFILLED_SYRINGE | SUBCUTANEOUS | Status: AC
  Filled 2021-06-01: qty 0.6

## 2021-06-01 NOTE — Patient Instructions (Signed)
Pegfilgrastim injection What is this medicine? PEGFILGRASTIM (PEG fil gra stim) is a long-acting granulocyte colony-stimulating factor that stimulates the growth of neutrophils, a type of white blood cell important in the body's fight against infection. It is used to reduce the incidence of fever and infection in patients with certain types of cancer who are receiving chemotherapy that affects the bone marrow, and to increase survival after being exposed to high doses of radiation. This medicine may be used for other purposes; ask your health care provider or pharmacist if you have questions. COMMON BRAND NAME(S): Fulphila, Neulasta, Nyvepria, UDENYCA, Ziextenzo What should I tell my health care provider before I take this medicine? They need to know if you have any of these conditions:  kidney disease  latex allergy  ongoing radiation therapy  sickle cell disease  skin reactions to acrylic adhesives (On-Body Injector only)  an unusual or allergic reaction to pegfilgrastim, filgrastim, other medicines, foods, dyes, or preservatives  pregnant or trying to get pregnant  breast-feeding How should I use this medicine? This medicine is for injection under the skin. If you get this medicine at home, you will be taught how to prepare and give the pre-filled syringe or how to use the On-body Injector. Refer to the patient Instructions for Use for detailed instructions. Use exactly as directed. Tell your healthcare provider immediately if you suspect that the On-body Injector may not have performed as intended or if you suspect the use of the On-body Injector resulted in a missed or partial dose. It is important that you put your used needles and syringes in a special sharps container. Do not put them in a trash can. If you do not have a sharps container, call your pharmacist or healthcare provider to get one. Talk to your pediatrician regarding the use of this medicine in children. While this drug  may be prescribed for selected conditions, precautions do apply. Overdosage: If you think you have taken too much of this medicine contact a poison control center or emergency room at once. NOTE: This medicine is only for you. Do not share this medicine with others. What if I miss a dose? It is important not to miss your dose. Call your doctor or health care professional if you miss your dose. If you miss a dose due to an On-body Injector failure or leakage, a new dose should be administered as soon as possible using a single prefilled syringe for manual use. What may interact with this medicine? Interactions have not been studied. This list may not describe all possible interactions. Give your health care provider a list of all the medicines, herbs, non-prescription drugs, or dietary supplements you use. Also tell them if you smoke, drink alcohol, or use illegal drugs. Some items may interact with your medicine. What should I watch for while using this medicine? Your condition will be monitored carefully while you are receiving this medicine. You may need blood work done while you are taking this medicine. Talk to your health care provider about your risk of cancer. You may be more at risk for certain types of cancer if you take this medicine. If you are going to need a MRI, CT scan, or other procedure, tell your doctor that you are using this medicine (On-Body Injector only). What side effects may I notice from receiving this medicine? Side effects that you should report to your doctor or health care professional as soon as possible:  allergic reactions (skin rash, itching or hives, swelling of   the face, lips, or tongue)  back pain  dizziness  fever  pain, redness, or irritation at site where injected  pinpoint red spots on the skin  red or dark-brown urine  shortness of breath or breathing problems  stomach or side pain, or pain at the shoulder  swelling  tiredness  trouble  passing urine or change in the amount of urine  unusual bruising or bleeding Side effects that usually do not require medical attention (report to your doctor or health care professional if they continue or are bothersome):  bone pain  muscle pain This list may not describe all possible side effects. Call your doctor for medical advice about side effects. You may report side effects to FDA at 1-800-FDA-1088. Where should I keep my medicine? Keep out of the reach of children. If you are using this medicine at home, you will be instructed on how to store it. Throw away any unused medicine after the expiration date on the label. NOTE: This sheet is a summary. It may not cover all possible information. If you have questions about this medicine, talk to your doctor, pharmacist, or health care provider.  2021 Elsevier/Gold Standard (2020-01-04 13:20:51)  

## 2021-06-02 ENCOUNTER — Ambulatory Visit: Payer: 59 | Admitting: Hematology and Oncology

## 2021-06-02 ENCOUNTER — Other Ambulatory Visit: Payer: 59

## 2021-06-04 NOTE — Progress Notes (Signed)
Patient Care Team: Mike Craze, DO as PCP - General (Internal Medicine) Mauro Kaufmann, RN as Oncology Nurse Navigator Rockwell Germany, RN as Oncology Nurse Navigator  DIAGNOSIS:    ICD-10-CM   1. Malignant neoplasm of upper-outer quadrant of left breast in female, estrogen receptor positive ([place])  C50.412    Z17.0       SUMMARY OF ONCOLOGIC HISTORY: Oncology History  Malignant neoplasm of upper-outer quadrant of left breast in female, estrogen receptor positive ([place])  05/06/2021 Initial Diagnosis   Large circumscribed multicystic mass left breast 9 to 10 cm with multiple left axillary lymph nodes with cortical thickening: biopsy 3 o'clock position: Grade 2 IDC, ER 10%, PR 0%, Ki-67 60%, HER2 negative Right breast: 0.4 cm mass at 9:30 position: Benign on biopsy   05/12/2021 Cancer Staging   Staging form: Breast, AJCC 8th Edition - Clinical stage from 05/12/2021: Stage IIIB (cT3, cN1, cM0, G2, ER-, PR-, HER2-) - Signed by Nicholas Lose, MD on 05/12/2021  Stage prefix: Initial diagnosis  Histologic grading system: 3 grade system    05/30/2021 -  Chemotherapy    Patient is on Treatment Plan: BREAST PEMBROLIZUMAB + AC Q21D X 4 CYCLES FOLLOWED BY PEMBROLIZUMAB + CARBOPLATIN D1 + PACLITAXEL D1,8,15 Q21D X 4 CYCLES          CHIEF COMPLIANT: Cycle 1 Day 8 Adriamycin, Cytoxan, Pembrolizumab  INTERVAL HISTORY: Sheena Mcdaniel is a 50 y.o. with above-mentioned history of left breast cancer currently on neoadjuvant chemotherapy with Adriamycin and Cytoxan along with pembrolizumab. She presents to the clinic today for a toxicity check following cycle 1.  Overall she tolerated chemo extremely well without any nausea or vomiting.  She did have fatigue for 3 to 4 days.  She took lorazepam which helped her sleep very well.  She did not experience any bone pain.  Did not have any constipation or diarrhea.  ALLERGIES:  has No Known Allergies.  MEDICATIONS:  Current Outpatient Medications   Medication Sig Dispense Refill   dexamethasone (DECADRON) 4 MG tablet Take 1 tablet day after chemo and 1 tablet 2 days after chemo with food 8 tablet 0   HYDROcodone-acetaminophen (NORCO/VICODIN) 5-325 MG tablet Take 1-2 tablets by mouth every 6 (six) hours as needed for moderate pain or severe pain. 10 tablet 0   lidocaine-prilocaine (EMLA) cream Apply to affected area once 30 g 3   LORazepam (ATIVAN) 0.5 MG tablet Take 1 tablet (0.5 mg total) by mouth at bedtime as needed for sleep. 30 tablet 0   ondansetron (ZOFRAN) 8 MG tablet Take 1 tablet (8 mg total) by mouth 2 (two) times daily as needed. Start on the third day after carboplatin and AC chemotherapy. 30 tablet 1   prochlorperazine (COMPAZINE) 10 MG tablet Take 1 tablet (10 mg total) by mouth every 6 (six) hours as needed (Nausea or vomiting). 30 tablet 1   traZODone (DESYREL) 50 MG tablet Take 1 tablet (50 mg total) by mouth at bedtime. 30 tablet 1   No current facility-administered medications for this visit.    PHYSICAL EXAMINATION: ECOG PERFORMANCE STATUS: 1 - Symptomatic but completely ambulatory  Vitals:   06/05/21 0932  BP: 138/90  Pulse: 92  Resp: 18  Temp: (!) 97.5 F (36.4 C)  SpO2: 100%   Filed Weights   06/05/21 0932  Weight: 158 lb 3.2 oz (71.8 kg)    LABORATORY DATA:  I have reviewed the data as listed CMP Latest Ref Rng & Units 05/29/2021  Glucose 70 - 99 mg/dL 97  BUN 6 - 20 mg/dL 13  Creatinine 0.44 - 1.00 mg/dL 0.79  Sodium 135 - 145 mmol/L 141  Potassium 3.5 - 5.1 mmol/L 4.2  Chloride 98 - 111 mmol/L 103  CO2 22 - 32 mmol/L 26  Calcium 8.9 - 10.3 mg/dL 9.9  Total Protein 6.5 - 8.1 g/dL 7.6  Total Bilirubin 0.3 - 1.2 mg/dL 0.4  Alkaline Phos 38 - 126 U/L 67  AST 15 - 41 U/L 25  ALT 0 - 44 U/L 18    Lab Results  Component Value Date   WBC 7.6 06/05/2021   HGB 11.8 (L) 06/05/2021   HCT 34.9 (L) 06/05/2021   MCV 82.7 06/05/2021   PLT 250 06/05/2021   NEUTROABS PENDING 06/05/2021     ASSESSMENT & PLAN:  Malignant neoplasm of upper-outer quadrant of left breast in female, estrogen receptor positive ([place]) 05/06/2021: Large circumscribed multicystic mass left breast 9 to 10 cm with multiple left axillary lymph nodes with cortical thickening: biopsy 3 o'clock position: Grade 2 IDC, 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  Neoadjuvant chemotherapy with Adriamycin and Cytoxan along with pembrolizumab followed by Taxol carboplatin and pembrolizumab (pembrolizumab maintenance for 1 year) 2. mastectomy with axillary lymph node dissection 3.  Adjuvant radiation 4.  Followed by adjuvant antiestrogen therapy (since she is 10% ER positive) ------------------------------------------------------------------------------------------------------------------------------------------------------ Current treatment: Cycle 1 day 8 Adriamycin and Cytoxan with pembrolizumab (this will start tomorrow) 05/20/2021 echocardiogram EF 60 to 65% 05/20/2021: CT CAP: Large left breast mass with deep involvement of left pectoralis muscle and overlying skin thickening, mild left axillary adenopathy.  No metastatic disease elsewhere 05/21/2021: Bone scan: No bone metastases 05/28/2021: Breast MRI: Large mixed cystic and solid enhancing mass involving outer left breast 10 x 7.3 x 8.5 cm posterior involving pectoralis muscle extends to the nipple mass and enhancement together 15 cm.  Left axillary lymphadenopathy. ------------------------------------------------------------------------------------------------------- Chemo Toxicities: Denies any major adverse effects other than mild fatigue. Hot flashes Labs have been reviewed and she does not have any cytopenias at this time.  It is likely that she nadired couple of days ago.  We will keep the dosage the same for the next treatment. RTC in 2 weeks for cycle 2   No orders of the  defined types were placed in this encounter.  The patient has a good understanding of the overall plan. she agrees with it. she will call with any problems that may develop before the next visit here.  Total time spent: 30 mins including face to face time and time spent for planning, charting and coordination of care  Rulon Eisenmenger, MD, MPH 06/05/2021  I, Molly Dorshimer, am acting as scribe for Dr. Nicholas Lose.  I have reviewed the above documentation for accuracy and completeness, and I agree with the above.

## 2021-06-04 NOTE — Assessment & Plan Note (Signed)
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%, HER2 negative Right breast: 0.4 cm mass at 9:30 position: Benign on biopsy  Treatment plan: 1.Neoadjuvant chemotherapy with Adriamycin and Cytoxan along with pembrolizumab followed by Taxol carboplatin and pembrolizumab (pembrolizumab maintenance for 1 year) 2.mastectomy with axillary lymph node dissection 3.Adjuvant radiation 4.Followed by adjuvant antiestrogen therapy (since she is 10% ER positive) ------------------------------------------------------------------------------------------------------------------------------------------------------ Current treatment: Cycle 1 day 8 Adriamycin and Cytoxan with pembrolizumab (this will start tomorrow) 05/20/2021 echocardiogram EF 60 to 65% 05/20/2021: CT CAP: Large left breast mass with deep involvement of left pectoralis muscle and overlying skin thickening, mild left axillary adenopathy.  No metastatic disease elsewhere 05/21/2021: Bone scan: No bone metastases 05/28/2021: Breast MRI: Large mixed cystic and solid enhancing mass involving outer left breast 10 x 7.3 x 8.5 cm posterior involving pectoralis muscle extends to the nipple mass and enhancement together 15 cm.  Left axillary lymphadenopathy. ------------------------------------------------------------------------------------------------------- Chemo Toxicities:  RTC in 2 weeks for cycle 2

## 2021-06-05 ENCOUNTER — Inpatient Hospital Stay: Payer: 59

## 2021-06-05 ENCOUNTER — Inpatient Hospital Stay (HOSPITAL_BASED_OUTPATIENT_CLINIC_OR_DEPARTMENT_OTHER): Payer: 59 | Admitting: Hematology and Oncology

## 2021-06-05 ENCOUNTER — Encounter: Payer: Self-pay | Admitting: *Deleted

## 2021-06-05 ENCOUNTER — Other Ambulatory Visit: Payer: Self-pay

## 2021-06-05 DIAGNOSIS — C50412 Malignant neoplasm of upper-outer quadrant of left female breast: Secondary | ICD-10-CM

## 2021-06-05 DIAGNOSIS — Z95828 Presence of other vascular implants and grafts: Secondary | ICD-10-CM

## 2021-06-05 DIAGNOSIS — Z170 Estrogen receptor positive status [ER+]: Secondary | ICD-10-CM

## 2021-06-05 DIAGNOSIS — Z5112 Encounter for antineoplastic immunotherapy: Secondary | ICD-10-CM | POA: Diagnosis not present

## 2021-06-05 LAB — CMP (CANCER CENTER ONLY)
ALT: 17 U/L (ref 0–44)
AST: 17 U/L (ref 15–41)
Albumin: 3.6 g/dL (ref 3.5–5.0)
Alkaline Phosphatase: 114 U/L (ref 38–126)
Anion gap: 10 (ref 5–15)
BUN: 13 mg/dL (ref 6–20)
CO2: 26 mmol/L (ref 22–32)
Calcium: 9.3 mg/dL (ref 8.9–10.3)
Chloride: 102 mmol/L (ref 98–111)
Creatinine: 0.7 mg/dL (ref 0.44–1.00)
GFR, Estimated: >60 mL/min (ref >60)
Glucose, Bld: 106 mg/dL — ABNORMAL HIGH (ref 70–99)
Potassium: 4.4 mmol/L (ref 3.5–5.1)
Sodium: 138 mmol/L (ref 135–145)
Total Bilirubin: 0.4 mg/dL (ref 0.3–1.2)
Total Protein: 7.2 g/dL (ref 6.5–8.1)

## 2021-06-05 LAB — CBC WITH DIFFERENTIAL (CANCER CENTER ONLY)
Abs Immature Granulocytes: 0.65 10*3/uL — ABNORMAL HIGH (ref 0.00–0.07)
Basophils Absolute: 0.1 10*3/uL (ref 0.0–0.1)
Basophils Relative: 1 %
Eosinophils Absolute: 0.2 10*3/uL (ref 0.0–0.5)
Eosinophils Relative: 3 %
HCT: 34.9 % — ABNORMAL LOW (ref 36.0–46.0)
Hemoglobin: 11.8 g/dL — ABNORMAL LOW (ref 12.0–15.0)
Immature Granulocytes: 8 %
Lymphocytes Relative: 7 %
Lymphs Abs: 0.5 10*3/uL — ABNORMAL LOW (ref 0.7–4.0)
MCH: 28 pg (ref 26.0–34.0)
MCHC: 33.8 g/dL (ref 30.0–36.0)
MCV: 82.7 fL (ref 80.0–100.0)
Monocytes Absolute: 0.2 10*3/uL (ref 0.1–1.0)
Monocytes Relative: 2 %
Neutro Abs: 6.1 10*3/uL (ref 1.7–7.7)
Neutrophils Relative %: 79 %
Platelet Count: 250 10*3/uL (ref 150–400)
RBC: 4.22 MIL/uL (ref 3.87–5.11)
RDW: 13.1 % (ref 11.5–15.5)
WBC Count: 7.6 10*3/uL (ref 4.0–10.5)
nRBC: 0 % (ref 0.0–0.2)

## 2021-06-05 LAB — TSH: TSH: 1.65 u[IU]/mL (ref 0.308–3.960)

## 2021-06-05 MED ORDER — SODIUM CHLORIDE 0.9% FLUSH
10.0000 mL | INTRAVENOUS | Status: AC | PRN
Start: 2021-06-05 — End: 2021-06-05
  Administered 2021-06-05: 10 mL
  Filled 2021-06-05: qty 10

## 2021-06-05 MED ORDER — HEPARIN SOD (PORK) LOCK FLUSH 100 UNIT/ML IV SOLN
500.0000 [IU] | INTRAVENOUS | Status: AC | PRN
  Administered 2021-06-05: 500 [IU]
  Filled 2021-06-05: qty 5

## 2021-06-06 LAB — T4: T4, Total: 10.2 ug/dL (ref 4.5–12.0)

## 2021-06-16 ENCOUNTER — Ambulatory Visit: Payer: 59 | Admitting: Hematology and Oncology

## 2021-06-16 ENCOUNTER — Ambulatory Visit: Payer: 59

## 2021-06-16 ENCOUNTER — Other Ambulatory Visit: Payer: 59

## 2021-06-17 NOTE — Assessment & Plan Note (Signed)
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%, HER2 negative Right breast: 0.4 cm mass at 9:30 position: Benign on biopsy  Treatment plan: 1.Neoadjuvant chemotherapy with Adriamycin and Cytoxan along with pembrolizumab followed by Taxol carboplatin and pembrolizumab (pembrolizumab maintenance for 1 year) 2.mastectomy withaxillary lymph nodedissection 3.Adjuvant radiation 4.Followed by adjuvant antiestrogen therapy (since she is 10% ER positive) ------------------------------------------------------------------------------------------------------------------------------------------------------ Current treatment: Cycle 1 day 8 Adriamycin and Cytoxan with pembrolizumab(this will start tomorrow) 05/20/2021 echocardiogram EF 60 to 65% 05/20/2021: CT CAP: Large left breast mass with deep involvement of left pectoralis muscle and overlying skin thickening, mild left axillary adenopathy. No metastatic disease elsewhere 05/21/2021: Bone scan: No bone metastases 05/28/2021: Breast MRI: Large mixed cystic and solid enhancing mass involving outer left breast 10 x 7.3 x 8.5 cm posterior involving pectoralis muscle extends to the nipple mass and enhancement together 15 cm. Left axillary lymphadenopathy. ------------------------------------------------------------------------------------------------------- Current Treatment; cycle 2 Adriamycin, Cytoxan and Pembrolizumab  Chemo Toxicities: Denies any major adverse effects other than mild fatigue.

## 2021-06-17 NOTE — Progress Notes (Signed)
Patient Care Team: Mike Craze, DO as PCP - General (Internal Medicine) Mauro Kaufmann, RN as Oncology Nurse Navigator Rockwell Germany, RN as Oncology Nurse Navigator  DIAGNOSIS:    ICD-10-CM   1. Malignant neoplasm of upper-outer quadrant of left breast in female, estrogen receptor positive ([place])  C50.412    Z17.0       SUMMARY OF ONCOLOGIC HISTORY: Oncology History  Malignant neoplasm of upper-outer quadrant of left breast in female, estrogen receptor positive ([place])  05/06/2021 Initial Diagnosis   Large circumscribed multicystic mass left breast 9 to 10 cm with multiple left axillary lymph nodes with cortical thickening: biopsy 3 o'clock position: Grade 2 IDC, ER 10%, PR 0%, Ki-67 60%, HER2 negative Right breast: 0.4 cm mass at 9:30 position: Benign on biopsy   05/12/2021 Cancer Staging   Staging form: Breast, AJCC 8th Edition - Clinical stage from 05/12/2021: Stage IIIB (cT3, cN1, cM0, G2, ER-, PR-, HER2-) - Signed by Nicholas Lose, MD on 05/12/2021  Stage prefix: Initial diagnosis  Histologic grading system: 3 grade system    05/30/2021 -  Chemotherapy    Patient is on Treatment Plan: BREAST PEMBROLIZUMAB + AC Q21D X 4 CYCLES FOLLOWED BY PEMBROLIZUMAB + CARBOPLATIN D1 + PACLITAXEL D1,8,15 Q21D X 4 CYCLES          CHIEF COMPLIANT: Cycle 2 Day 1 Adriamycin, Cytoxan, Pembrolizumab  INTERVAL HISTORY: Sheena Mcdaniel is a 50 y.o. with above-mentioned history of left breast cancer currently on neoadjuvant chemotherapy with Adriamycin and Cytoxan along with pembrolizumab. She presents to the clinic today for a toxicity check following cycle 2. she reports to be feeling really well over the past 2 weeks.  Does not have any new complaints.  She shaved her head and she is feeling much better about it.  No nausea or vomiting no fevers or chills.  ALLERGIES:  has No Known Allergies.  MEDICATIONS:  Current Outpatient Medications  Medication Sig Dispense Refill   dexamethasone  (DECADRON) 4 MG tablet Take 1 tablet day after chemo and 1 tablet 2 days after chemo with food 8 tablet 0   HYDROcodone-acetaminophen (NORCO/VICODIN) 5-325 MG tablet Take 1-2 tablets by mouth every 6 (six) hours as needed for moderate pain or severe pain. 10 tablet 0   lidocaine-prilocaine (EMLA) cream Apply to affected area once 30 g 3   LORazepam (ATIVAN) 0.5 MG tablet Take 1 tablet (0.5 mg total) by mouth at bedtime as needed for sleep. 30 tablet 0   ondansetron (ZOFRAN) 8 MG tablet Take 1 tablet (8 mg total) by mouth 2 (two) times daily as needed. Start on the third day after carboplatin and AC chemotherapy. 30 tablet 1   prochlorperazine (COMPAZINE) 10 MG tablet Take 1 tablet (10 mg total) by mouth every 6 (six) hours as needed (Nausea or vomiting). 30 tablet 1   traZODone (DESYREL) 50 MG tablet Take 1 tablet (50 mg total) by mouth at bedtime. 30 tablet 1   No current facility-administered medications for this visit.    PHYSICAL EXAMINATION: ECOG PERFORMANCE STATUS: 1 - Symptomatic but completely ambulatory  Vitals:   06/18/21 0901  BP: (!) 146/85  Pulse: 73  Resp: 18  Temp: 97.9 F (36.6 C)  SpO2: 100%   Filed Weights   06/18/21 0901  Weight: 161 lb 9.6 oz (73.3 kg)     LABORATORY DATA:  I have reviewed the data as listed CMP Latest Ref Rng & Units 06/05/2021 05/29/2021  Glucose 70 - 99 mg/dL  106(H) 97  BUN 6 - 20 mg/dL 13 13  Creatinine 0.44 - 1.00 mg/dL 0.70 0.79  Sodium 135 - 145 mmol/L 138 141  Potassium 3.5 - 5.1 mmol/L 4.4 4.2  Chloride 98 - 111 mmol/L 102 103  CO2 22 - 32 mmol/L 26 26  Calcium 8.9 - 10.3 mg/dL 9.3 9.9  Total Protein 6.5 - 8.1 g/dL 7.2 7.6  Total Bilirubin 0.3 - 1.2 mg/dL 0.4 0.4  Alkaline Phos 38 - 126 U/L 114 67  AST 15 - 41 U/L 17 25  ALT 0 - 44 U/L 17 18    Lab Results  Component Value Date   WBC 6.3 06/18/2021   HGB 11.2 (L) 06/18/2021   HCT 34.2 (L) 06/18/2021   MCV 83.6 06/18/2021   PLT 387 06/18/2021   NEUTROABS 3.9 06/18/2021     ASSESSMENT & PLAN:  Malignant neoplasm of upper-outer quadrant of left breast in female, estrogen receptor positive ([place]) 05/06/2021: Large circumscribed multicystic mass left breast 9 to 10 cm with multiple left axillary lymph nodes with cortical thickening: biopsy 3 o'clock position: Grade 2 IDC, 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  Neoadjuvant chemotherapy with Adriamycin and Cytoxan along with pembrolizumab followed by Taxol carboplatin and pembrolizumab (pembrolizumab maintenance for 1 year) 2. mastectomy with axillary lymph node dissection 3.  Adjuvant radiation 4.  Followed by adjuvant antiestrogen therapy (since she is 10% ER positive) ------------------------------------------------------------------------------------------------------------------------------------------------------ Current treatment: Cycle 1 day 8 Adriamycin and Cytoxan with pembrolizumab (this will start tomorrow) 05/20/2021 echocardiogram EF 60 to 65% 05/20/2021: CT CAP: Large left breast mass with deep involvement of left pectoralis muscle and overlying skin thickening, mild left axillary adenopathy.  No metastatic disease elsewhere 05/21/2021: Bone scan: No bone metastases 05/28/2021: Breast MRI: Large mixed cystic and solid enhancing mass involving outer left breast 10 x 7.3 x 8.5 cm posterior involving pectoralis muscle extends to the nipple mass and enhancement together 15 cm.  Left axillary lymphadenopathy. ------------------------------------------------------------------------------------------------------- Current Treatment; cycle 2 Adriamycin, Cytoxan and Pembrolizumab  Chemo Toxicities: Denies any major adverse effects other than mild fatigue. Chemotherapy-induced anemia: Hemoglobin today is 11.2, monitoring Denied any nausea or vomiting. She shaved her head and feels very good about not losing hair gradually. Denies  any fevers or chills. Taste and appetite are excellent.   No orders of the defined types were placed in this encounter.  The patient has a good understanding of the overall plan. she agrees with it. she will call with any problems that may develop before the next visit here.  Total time spent: 30 mins including face to face time and time spent for planning, charting and coordination of care  Rulon Eisenmenger, MD, MPH 06/18/2021  I, Thana Ates, am acting as scribe for Dr. Nicholas Lose.  I have reviewed the above documentation for accuracy and completeness, and I agree with the above.

## 2021-06-18 ENCOUNTER — Inpatient Hospital Stay: Payer: 59

## 2021-06-18 ENCOUNTER — Inpatient Hospital Stay (HOSPITAL_BASED_OUTPATIENT_CLINIC_OR_DEPARTMENT_OTHER): Payer: 59 | Admitting: Hematology and Oncology

## 2021-06-18 ENCOUNTER — Other Ambulatory Visit: Payer: Self-pay

## 2021-06-18 ENCOUNTER — Ambulatory Visit: Payer: 59

## 2021-06-18 DIAGNOSIS — C50412 Malignant neoplasm of upper-outer quadrant of left female breast: Secondary | ICD-10-CM | POA: Diagnosis not present

## 2021-06-18 DIAGNOSIS — Z170 Estrogen receptor positive status [ER+]: Secondary | ICD-10-CM

## 2021-06-18 DIAGNOSIS — Z95828 Presence of other vascular implants and grafts: Secondary | ICD-10-CM | POA: Insufficient documentation

## 2021-06-18 DIAGNOSIS — Z5112 Encounter for antineoplastic immunotherapy: Secondary | ICD-10-CM | POA: Diagnosis not present

## 2021-06-18 HISTORY — DX: Presence of other vascular implants and grafts: Z95.828

## 2021-06-18 LAB — TSH: TSH: 2.206 u[IU]/mL (ref 0.308–3.960)

## 2021-06-18 LAB — CMP (CANCER CENTER ONLY)
ALT: 23 U/L (ref 0–44)
AST: 19 U/L (ref 15–41)
Albumin: 3.7 g/dL (ref 3.5–5.0)
Alkaline Phosphatase: 62 U/L (ref 38–126)
Anion gap: 9 (ref 5–15)
BUN: 15 mg/dL (ref 6–20)
CO2: 27 mmol/L (ref 22–32)
Calcium: 9.3 mg/dL (ref 8.9–10.3)
Chloride: 106 mmol/L (ref 98–111)
Creatinine: 0.79 mg/dL (ref 0.44–1.00)
GFR, Estimated: >60 mL/min (ref >60)
Glucose, Bld: 103 mg/dL — ABNORMAL HIGH (ref 70–99)
Potassium: 4.2 mmol/L (ref 3.5–5.1)
Sodium: 142 mmol/L (ref 135–145)
Total Bilirubin: 0.2 mg/dL — ABNORMAL LOW (ref 0.3–1.2)
Total Protein: 6.9 g/dL (ref 6.5–8.1)

## 2021-06-18 LAB — CBC WITH DIFFERENTIAL (CANCER CENTER ONLY)
Abs Immature Granulocytes: 0.22 10*3/uL — ABNORMAL HIGH (ref 0.00–0.07)
Basophils Absolute: 0.2 10*3/uL — ABNORMAL HIGH (ref 0.0–0.1)
Basophils Relative: 3 %
Eosinophils Absolute: 0.1 10*3/uL (ref 0.0–0.5)
Eosinophils Relative: 2 %
HCT: 34.2 % — ABNORMAL LOW (ref 36.0–46.0)
Hemoglobin: 11.2 g/dL — ABNORMAL LOW (ref 12.0–15.0)
Immature Granulocytes: 4 %
Lymphocytes Relative: 20 %
Lymphs Abs: 1.3 10*3/uL (ref 0.7–4.0)
MCH: 27.4 pg (ref 26.0–34.0)
MCHC: 32.7 g/dL (ref 30.0–36.0)
MCV: 83.6 fL (ref 80.0–100.0)
Monocytes Absolute: 0.7 10*3/uL (ref 0.1–1.0)
Monocytes Relative: 10 %
Neutro Abs: 3.9 10*3/uL (ref 1.7–7.7)
Neutrophils Relative %: 61 %
Platelet Count: 387 10*3/uL (ref 150–400)
RBC: 4.09 MIL/uL (ref 3.87–5.11)
RDW: 14.2 % (ref 11.5–15.5)
WBC Count: 6.3 10*3/uL (ref 4.0–10.5)
nRBC: 0 % (ref 0.0–0.2)

## 2021-06-18 MED ORDER — DOXORUBICIN HCL CHEMO IV INJECTION 2 MG/ML
60.0000 mg/m2 | Freq: Once | INTRAVENOUS | Status: AC
  Administered 2021-06-18: 104 mg via INTRAVENOUS
  Filled 2021-06-18: qty 52

## 2021-06-18 MED ORDER — SODIUM CHLORIDE 0.9 % IV SOLN
600.0000 mg/m2 | Freq: Once | INTRAVENOUS | Status: AC
  Administered 2021-06-18: 1040 mg via INTRAVENOUS
  Filled 2021-06-18: qty 52

## 2021-06-18 MED ORDER — PALONOSETRON HCL INJECTION 0.25 MG/5ML
0.2500 mg | Freq: Once | INTRAVENOUS | Status: AC
  Administered 2021-06-18: 0.25 mg via INTRAVENOUS

## 2021-06-18 MED ORDER — HEPARIN SOD (PORK) LOCK FLUSH 100 UNIT/ML IV SOLN
500.0000 [IU] | Freq: Once | INTRAVENOUS | Status: AC | PRN
  Administered 2021-06-18: 500 [IU]
  Filled 2021-06-18: qty 5

## 2021-06-18 MED ORDER — SODIUM CHLORIDE 0.9 % IV SOLN
200.0000 mg | Freq: Once | INTRAVENOUS | Status: AC
  Administered 2021-06-18: 200 mg via INTRAVENOUS
  Filled 2021-06-18: qty 8

## 2021-06-18 MED ORDER — DEXAMETHASONE SODIUM PHOSPHATE 100 MG/10ML IJ SOLN
10.0000 mg | Freq: Once | INTRAMUSCULAR | Status: AC
  Administered 2021-06-18: 10 mg via INTRAVENOUS
  Filled 2021-06-18: qty 10

## 2021-06-18 MED ORDER — SODIUM CHLORIDE 0.9% FLUSH
10.0000 mL | Freq: Once | INTRAVENOUS | Status: AC
  Administered 2021-06-18: 10 mL
  Filled 2021-06-18: qty 10

## 2021-06-18 MED ORDER — FOSAPREPITANT DIMEGLUMINE INJECTION 150 MG
150.0000 mg | Freq: Once | INTRAVENOUS | Status: AC
  Administered 2021-06-18: 150 mg via INTRAVENOUS
  Filled 2021-06-18: qty 150

## 2021-06-18 MED ORDER — PALONOSETRON HCL INJECTION 0.25 MG/5ML
INTRAVENOUS | Status: AC
  Filled 2021-06-18: qty 5

## 2021-06-18 MED ORDER — SODIUM CHLORIDE 0.9 % IV SOLN
Freq: Once | INTRAVENOUS | Status: AC
Start: 2021-06-18 — End: 2021-06-18
  Filled 2021-06-18: qty 250

## 2021-06-18 MED ORDER — SODIUM CHLORIDE 0.9% FLUSH
10.0000 mL | INTRAVENOUS | Status: DC | PRN
  Administered 2021-06-18: 10 mL
  Filled 2021-06-18: qty 10

## 2021-06-18 NOTE — Patient Instructions (Signed)
[facility]  Discharge Instructions: Thank you for choosing [facility] to provide your oncology and hematology care.   If you have a lab appointment with the [facility], please go directly to the [facility] and check in at the registration area.   Wear comfortable clothing and clothing appropriate for easy access to any Portacath or PICC line.   We strive to give you quality time with your provider. You may need to reschedule your appointment if you arrive late (15 or more minutes).  Arriving late affects you and other patients whose appointments are after yours.  Also, if you miss three or more appointments without notifying the office, you may be dismissed from the clinic at the provider's discretion.      For prescription refill requests, have your pharmacy contact our office and allow 72 hours for refills to be completed.    Today you received the following chemotherapy and/or immunotherapy agents: Pembolizumab (Keytruda), Doxorubicin (Adriamycin), and Cyclophosphamide (Cytoxan).   To help prevent nausea and vomiting after your treatment, we encourage you to take your nausea medication as directed.  BELOW ARE SYMPTOMS THAT SHOULD BE REPORTED IMMEDIATELY: *FEVER GREATER THAN 100.4 F (38 C) OR HIGHER *CHILLS OR SWEATING *NAUSEA AND VOMITING THAT IS NOT CONTROLLED WITH YOUR NAUSEA MEDICATION *UNUSUAL SHORTNESS OF BREATH *UNUSUAL BRUISING OR BLEEDING *URINARY PROBLEMS (pain or burning when urinating, or frequent urination) *BOWEL PROBLEMS (unusual diarrhea, constipation, pain near the anus) TENDERNESS IN MOUTH AND THROAT WITH OR WITHOUT PRESENCE OF ULCERS (sore throat, sores in mouth, or a toothache) UNUSUAL RASH, SWELLING OR PAIN  UNUSUAL VAGINAL DISCHARGE OR ITCHING   Items with * indicate a potential emergency and should be followed up as soon as possible or go to the Emergency Department if any problems should occur.  Please show  the CHEMOTHERAPY ALERT CARD or IMMUNOTHERAPY ALERT CARD at check-in to the Emergency Department and triage nurse.  Should you have questions after your visit or need to cancel or reschedule your appointment, please contact [facility]  Dept: [phone]  and follow the prompts.  Office hours are 8:00 a.m. to 4:30 p.m. Monday - Friday. Please note that voicemails left after 4:00 p.m. may not be returned until the following business day.  We are closed weekends and major holidays. You have access to a nurse at all times for urgent questions. Please call the main number to the clinic Dept: [phone] and follow the prompts.   For any non-urgent questions, you may also contact your provider using MyChart. We now offer e-Visits for anyone 40 and older to request care online for non-urgent symptoms. For details visit [email or web address].   Also download the MyChart app! Go to the app store, search "MyChart", open the app, select [facility], and log in with your MyChart username and password.  Due to Covid, a mask is required upon entering the hospital/clinic. If you do not have a mask, one will be given to you upon arrival. For doctor visits, patients may have 1 support person aged 68 or older with them. For treatment visits, patients cannot have anyone with them due to current Covid guidelines and our immunocompromised population.

## 2021-06-19 LAB — T4: T4, Total: 10 ug/dL (ref 4.5–12.0)

## 2021-06-20 ENCOUNTER — Other Ambulatory Visit: Payer: Self-pay

## 2021-06-20 ENCOUNTER — Inpatient Hospital Stay: Payer: 59

## 2021-06-20 VITALS — BP 121/87 | HR 67 | Temp 97.6°F | Resp 18

## 2021-06-20 DIAGNOSIS — C50412 Malignant neoplasm of upper-outer quadrant of left female breast: Secondary | ICD-10-CM

## 2021-06-20 DIAGNOSIS — Z170 Estrogen receptor positive status [ER+]: Secondary | ICD-10-CM

## 2021-06-20 DIAGNOSIS — Z5112 Encounter for antineoplastic immunotherapy: Secondary | ICD-10-CM | POA: Diagnosis not present

## 2021-06-20 MED ORDER — PEGFILGRASTIM-CBQV 6 MG/0.6ML ~~LOC~~ SOSY
6.0000 mg | PREFILLED_SYRINGE | Freq: Once | SUBCUTANEOUS | Status: AC
  Administered 2021-06-20: 6 mg via SUBCUTANEOUS

## 2021-06-20 NOTE — Patient Instructions (Signed)

## 2021-07-07 ENCOUNTER — Ambulatory Visit: Payer: 59

## 2021-07-07 ENCOUNTER — Ambulatory Visit: Payer: 59 | Admitting: Hematology and Oncology

## 2021-07-07 ENCOUNTER — Other Ambulatory Visit: Payer: 59

## 2021-07-08 NOTE — Progress Notes (Signed)
Patient Care Team: Mike Craze, DO as PCP - General (Internal Medicine) Mauro Kaufmann, RN as Oncology Nurse Navigator Rockwell Germany, RN as Oncology Nurse Navigator  DIAGNOSIS:    ICD-10-CM   1. Malignant neoplasm of upper-outer quadrant of left breast in female, estrogen receptor positive ([place])  C50.412    Z17.0       SUMMARY OF ONCOLOGIC HISTORY: Oncology History  Malignant neoplasm of upper-outer quadrant of left breast in female, estrogen receptor positive ([place])  05/06/2021 Initial Diagnosis   Large circumscribed multicystic mass left breast 9 to 10 cm with multiple left axillary lymph nodes with cortical thickening: biopsy 3 o'clock position: Grade 2 IDC, ER 10%, PR 0%, Ki-67 60%, HER2 negative Right breast: 0.4 cm mass at 9:30 position: Benign on biopsy   05/12/2021 Cancer Staging   Staging form: Breast, AJCC 8th Edition - Clinical stage from 05/12/2021: Stage IIIB (cT3, cN1, cM0, G2, ER-, PR-, HER2-) - Signed by Nicholas Lose, MD on 05/12/2021  Stage prefix: Initial diagnosis  Histologic grading system: 3 grade system    05/30/2021 -  Chemotherapy    Patient is on Treatment Plan: BREAST PEMBROLIZUMAB + AC Q21D X 4 CYCLES FOLLOWED BY PEMBROLIZUMAB + CARBOPLATIN D1 + PACLITAXEL D1,8,15 Q21D X 4 CYCLES          CHIEF COMPLIANT:  Cycle 3 Day 1 Adriamycin, Cytoxan, Pembrolizumab  INTERVAL HISTORY: Sheena Mcdaniel is a 50 y.o. with above-mentioned history of left breast cancer currently on neoadjuvant chemotherapy with Adriamycin and Cytoxan along with pembrolizumab. She presents to the clinic today for a toxicity check following cycle 3.  Other than weeklong fatigue she has tolerated the treatment very well.  Did not have any nausea vomiting diarrhea or constipation issues.  Did not have any mouth sores or fevers or chills.  ALLERGIES:  has No Known Allergies.  MEDICATIONS:  Current Outpatient Medications  Medication Sig Dispense Refill   dexamethasone (DECADRON)  4 MG tablet Take 1 tablet day after chemo and 1 tablet 2 days after chemo with food 8 tablet 0   lidocaine-prilocaine (EMLA) cream Apply to affected area once 30 g 3   LORazepam (ATIVAN) 0.5 MG tablet Take 1 tablet (0.5 mg total) by mouth at bedtime as needed for sleep. 30 tablet 0   ondansetron (ZOFRAN) 8 MG tablet Take 1 tablet (8 mg total) by mouth 2 (two) times daily as needed. Start on the third day after carboplatin and AC chemotherapy. 30 tablet 1   prochlorperazine (COMPAZINE) 10 MG tablet Take 1 tablet (10 mg total) by mouth every 6 (six) hours as needed (Nausea or vomiting). 30 tablet 1   traZODone (DESYREL) 50 MG tablet Take 1 tablet (50 mg total) by mouth at bedtime. 30 tablet 1   No current facility-administered medications for this visit.    PHYSICAL EXAMINATION: ECOG PERFORMANCE STATUS: 1 - Symptomatic but completely ambulatory  Vitals:   07/09/21 0845  BP: (!) 146/89  Pulse: 71  Resp: 18  Temp: 97.6 F (36.4 C)  SpO2: 100%   Filed Weights   07/09/21 0845  Weight: 160 lb 1.6 oz (72.6 kg)    LABORATORY DATA:  I have reviewed the data as listed CMP Latest Ref Rng & Units 06/18/2021 06/05/2021 05/29/2021  Glucose 70 - 99 mg/dL 103(H) 106(H) 97  BUN 6 - 20 mg/dL 15 13 13   Creatinine 0.44 - 1.00 mg/dL 0.79 0.70 0.79  Sodium 135 - 145 mmol/L 142 138 141  Potassium 3.5 -  5.1 mmol/L 4.2 4.4 4.2  Chloride 98 - 111 mmol/L 106 102 103  CO2 22 - 32 mmol/L 27 26 26   Calcium 8.9 - 10.3 mg/dL 9.3 9.3 9.9  Total Protein 6.5 - 8.1 g/dL 6.9 7.2 7.6  Total Bilirubin 0.3 - 1.2 mg/dL 0.2(L) 0.4 0.4  Alkaline Phos 38 - 126 U/L 62 114 67  AST 15 - 41 U/L 19 17 25   ALT 0 - 44 U/L 23 17 18     Lab Results  Component Value Date   WBC 5.7 07/09/2021   HGB 11.4 (L) 07/09/2021   HCT 33.9 (L) 07/09/2021   MCV 83.9 07/09/2021   PLT 405 (H) 07/09/2021   NEUTROABS 3.7 07/09/2021    ASSESSMENT & PLAN:  Malignant neoplasm of upper-outer quadrant of left breast in female, estrogen  receptor positive ([place]) 05/06/2021: Large circumscribed multicystic mass left breast 9 to 10 cm with multiple left axillary lymph nodes with cortical thickening: biopsy 3 o'clock position: Grade 2 IDC, 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  Neoadjuvant chemotherapy with Adriamycin and Cytoxan along with pembrolizumab followed by Taxol carboplatin and pembrolizumab (pembrolizumab maintenance for 1 year) 2. mastectomy with axillary lymph node dissection 3.  Adjuvant radiation 4.  Followed by adjuvant antiestrogen therapy (since she is 10% ER positive) ------------------------------------------------------------------------------------------------------------------------------------------------------ Current treatment: Cycle 1 day 8 Adriamycin and Cytoxan with pembrolizumab (this will start tomorrow) 05/20/2021 echocardiogram EF 60 to 65% 05/20/2021: CT CAP: Large left breast mass with deep involvement of left pectoralis muscle and overlying skin thickening, mild left axillary adenopathy.  No metastatic disease elsewhere 05/21/2021: Bone scan: No bone metastases 05/28/2021: Breast MRI: Large mixed cystic and solid enhancing mass involving outer left breast 10 x 7.3 x 8.5 cm posterior involving pectoralis muscle extends to the nipple mass and enhancement together 15 cm.  Left axillary lymphadenopathy. ------------------------------------------------------------------------------------------------------- Current Treatment; cycle 3 Adriamycin, Cytoxan and Pembrolizumab   Chemo Toxicities:  Fatigue that lasted for 1 week after each chemo Chemotherapy-induced anemia: Hemoglobin today is 11.4, monitoring  I discussed with her extensively about the subsequent treatment strategy with the Taxol Doristine Church. Return to clinic in 3 weeks for cycle 3 Monitoring closely for toxicities.   No orders of the defined types were  placed in this encounter.  The patient has a good understanding of the overall plan. she agrees with it. she will call with any problems that may develop before the next visit here.  Total time spent: 30 mins including face to face time and time spent for planning, charting and coordination of care  Rulon Eisenmenger, MD, MPH 07/09/2021  I, Thana Ates, am acting as scribe for Dr. Nicholas Lose.  I have reviewed the above documentation for accuracy and completeness, and I agree with the above.

## 2021-07-09 ENCOUNTER — Other Ambulatory Visit: Payer: Self-pay | Admitting: Pharmacist

## 2021-07-09 ENCOUNTER — Inpatient Hospital Stay: Payer: 59 | Attending: Hematology and Oncology

## 2021-07-09 ENCOUNTER — Ambulatory Visit: Payer: 59

## 2021-07-09 ENCOUNTER — Other Ambulatory Visit: Payer: Self-pay

## 2021-07-09 ENCOUNTER — Inpatient Hospital Stay (HOSPITAL_BASED_OUTPATIENT_CLINIC_OR_DEPARTMENT_OTHER): Payer: 59 | Admitting: Hematology and Oncology

## 2021-07-09 DIAGNOSIS — C773 Secondary and unspecified malignant neoplasm of axilla and upper limb lymph nodes: Secondary | ICD-10-CM | POA: Insufficient documentation

## 2021-07-09 DIAGNOSIS — Z5111 Encounter for antineoplastic chemotherapy: Secondary | ICD-10-CM | POA: Insufficient documentation

## 2021-07-09 DIAGNOSIS — T451X5A Adverse effect of antineoplastic and immunosuppressive drugs, initial encounter: Secondary | ICD-10-CM | POA: Insufficient documentation

## 2021-07-09 DIAGNOSIS — Z170 Estrogen receptor positive status [ER+]: Secondary | ICD-10-CM | POA: Diagnosis not present

## 2021-07-09 DIAGNOSIS — Z5112 Encounter for antineoplastic immunotherapy: Secondary | ICD-10-CM | POA: Diagnosis not present

## 2021-07-09 DIAGNOSIS — C50412 Malignant neoplasm of upper-outer quadrant of left female breast: Secondary | ICD-10-CM

## 2021-07-09 DIAGNOSIS — Z79899 Other long term (current) drug therapy: Secondary | ICD-10-CM | POA: Insufficient documentation

## 2021-07-09 DIAGNOSIS — Z95828 Presence of other vascular implants and grafts: Secondary | ICD-10-CM

## 2021-07-09 DIAGNOSIS — Z5189 Encounter for other specified aftercare: Secondary | ICD-10-CM | POA: Insufficient documentation

## 2021-07-09 DIAGNOSIS — D6481 Anemia due to antineoplastic chemotherapy: Secondary | ICD-10-CM | POA: Diagnosis not present

## 2021-07-09 LAB — CBC WITH DIFFERENTIAL (CANCER CENTER ONLY)
Abs Immature Granulocytes: 0.06 10*3/uL (ref 0.00–0.07)
Basophils Absolute: 0.1 10*3/uL (ref 0.0–0.1)
Basophils Relative: 1 %
Eosinophils Absolute: 0.1 10*3/uL (ref 0.0–0.5)
Eosinophils Relative: 1 %
HCT: 33.9 % — ABNORMAL LOW (ref 36.0–46.0)
Hemoglobin: 11.4 g/dL — ABNORMAL LOW (ref 12.0–15.0)
Immature Granulocytes: 1 %
Lymphocytes Relative: 16 %
Lymphs Abs: 0.9 10*3/uL (ref 0.7–4.0)
MCH: 28.2 pg (ref 26.0–34.0)
MCHC: 33.6 g/dL (ref 30.0–36.0)
MCV: 83.9 fL (ref 80.0–100.0)
Monocytes Absolute: 0.8 10*3/uL (ref 0.1–1.0)
Monocytes Relative: 15 %
Neutro Abs: 3.7 10*3/uL (ref 1.7–7.7)
Neutrophils Relative %: 66 %
Platelet Count: 405 10*3/uL — ABNORMAL HIGH (ref 150–400)
RBC: 4.04 MIL/uL (ref 3.87–5.11)
RDW: 16 % — ABNORMAL HIGH (ref 11.5–15.5)
WBC Count: 5.7 10*3/uL (ref 4.0–10.5)
nRBC: 0 % (ref 0.0–0.2)

## 2021-07-09 LAB — CMP (CANCER CENTER ONLY)
ALT: 21 U/L (ref 0–44)
AST: 21 U/L (ref 15–41)
Albumin: 3.9 g/dL (ref 3.5–5.0)
Alkaline Phosphatase: 55 U/L (ref 38–126)
Anion gap: 9 (ref 5–15)
BUN: 16 mg/dL (ref 6–20)
CO2: 27 mmol/L (ref 22–32)
Calcium: 9.7 mg/dL (ref 8.9–10.3)
Chloride: 105 mmol/L (ref 98–111)
Creatinine: 0.83 mg/dL (ref 0.44–1.00)
GFR, Estimated: >60 mL/min (ref >60)
Glucose, Bld: 98 mg/dL (ref 70–99)
Potassium: 4.3 mmol/L (ref 3.5–5.1)
Sodium: 141 mmol/L (ref 135–145)
Total Bilirubin: 0.3 mg/dL (ref 0.3–1.2)
Total Protein: 7.2 g/dL (ref 6.5–8.1)

## 2021-07-09 LAB — TSH: TSH: 1.632 u[IU]/mL (ref 0.308–3.960)

## 2021-07-09 MED ORDER — SODIUM CHLORIDE 0.9 % IV SOLN
150.0000 mg | Freq: Once | INTRAVENOUS | Status: AC
  Administered 2021-07-09: 150 mg via INTRAVENOUS
  Filled 2021-07-09: qty 150

## 2021-07-09 MED ORDER — SODIUM CHLORIDE 0.9 % IV SOLN
Freq: Once | INTRAVENOUS | Status: AC
  Filled 2021-07-09: qty 250

## 2021-07-09 MED ORDER — SODIUM CHLORIDE 0.9 % IV SOLN
200.0000 mg | Freq: Once | INTRAVENOUS | Status: AC
  Administered 2021-07-09: 200 mg via INTRAVENOUS
  Filled 2021-07-09: qty 8

## 2021-07-09 MED ORDER — PALONOSETRON HCL INJECTION 0.25 MG/5ML
INTRAVENOUS | Status: AC
  Filled 2021-07-09: qty 5

## 2021-07-09 MED ORDER — SODIUM CHLORIDE 0.9% FLUSH
10.0000 mL | Freq: Once | INTRAVENOUS | Status: AC
  Administered 2021-07-09: 10 mL
  Filled 2021-07-09: qty 10

## 2021-07-09 MED ORDER — SODIUM CHLORIDE 0.9 % IV SOLN
10.0000 mg | Freq: Once | INTRAVENOUS | Status: AC
  Administered 2021-07-09: 10 mg via INTRAVENOUS
  Filled 2021-07-09: qty 10

## 2021-07-09 MED ORDER — SODIUM CHLORIDE 0.9 % IV SOLN
600.0000 mg/m2 | Freq: Once | INTRAVENOUS | Status: AC
  Administered 2021-07-09: 1040 mg via INTRAVENOUS
  Filled 2021-07-09: qty 52

## 2021-07-09 MED ORDER — PALONOSETRON HCL INJECTION 0.25 MG/5ML
0.2500 mg | Freq: Once | INTRAVENOUS | Status: AC
  Administered 2021-07-09: 0.25 mg via INTRAVENOUS

## 2021-07-09 MED ORDER — HEPARIN SOD (PORK) LOCK FLUSH 100 UNIT/ML IV SOLN
500.0000 [IU] | Freq: Once | INTRAVENOUS | Status: AC | PRN
  Administered 2021-07-09: 500 [IU]
  Filled 2021-07-09: qty 5

## 2021-07-09 MED ORDER — DOXORUBICIN HCL CHEMO IV INJECTION 2 MG/ML
60.0000 mg/m2 | Freq: Once | INTRAVENOUS | Status: AC
  Administered 2021-07-09: 104 mg via INTRAVENOUS
  Filled 2021-07-09: qty 52

## 2021-07-09 MED ORDER — SODIUM CHLORIDE 0.9% FLUSH
10.0000 mL | INTRAVENOUS | Status: DC | PRN
  Administered 2021-07-09: 10 mL
  Filled 2021-07-09: qty 10

## 2021-07-09 NOTE — Assessment & Plan Note (Signed)
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%, HER2 negative Right breast: 0.4 cm mass at 9:30 position: Benign on biopsy  Treatment plan: 1.Neoadjuvant chemotherapy with Adriamycin and Cytoxan along with pembrolizumab followed by Taxol carboplatin and pembrolizumab (pembrolizumab maintenance for 1 year) 2.mastectomy withaxillary lymph nodedissection 3.Adjuvant radiation 4.Followed by adjuvant antiestrogen therapy (since she is 10% ER positive) ------------------------------------------------------------------------------------------------------------------------------------------------------ Current treatment: Cycle 1 day8Adriamycin and Cytoxan with pembrolizumab(this will start tomorrow) 05/20/2021 echocardiogram EF 60 to 65% 05/20/2021: CT CAP: Large left breast mass with deep involvement of left pectoralis muscle and overlying skin thickening, mild left axillary adenopathy. No metastatic disease elsewhere 05/21/2021: Bone scan: No bone metastases 05/28/2021: Breast MRI: Large mixed cystic and solid enhancing mass involving outer left breast 10 x 7.3 x 8.5 cm posterior involving pectoralis muscle extends to the nipple mass and enhancement together 15 cm. Left axillary lymphadenopathy. ------------------------------------------------------------------------------------------------------- Current Treatment; cycle 3 Adriamycin, Cytoxan and Pembrolizumab  Chemo Toxicities:Denies any major adverse effects other than mild fatigue. Chemotherapy-induced anemia: Hemoglobin today is 11.2, monitoring  Return to clinic in 3 weeks for cycle 3

## 2021-07-10 LAB — T4: T4, Total: 11.3 ug/dL (ref 4.5–12.0)

## 2021-07-11 ENCOUNTER — Inpatient Hospital Stay: Payer: 59

## 2021-07-11 ENCOUNTER — Other Ambulatory Visit: Payer: Self-pay

## 2021-07-11 VITALS — BP 123/78 | HR 74 | Temp 97.8°F | Resp 18

## 2021-07-11 DIAGNOSIS — Z170 Estrogen receptor positive status [ER+]: Secondary | ICD-10-CM

## 2021-07-11 DIAGNOSIS — Z5112 Encounter for antineoplastic immunotherapy: Secondary | ICD-10-CM | POA: Diagnosis not present

## 2021-07-11 DIAGNOSIS — C50412 Malignant neoplasm of upper-outer quadrant of left female breast: Secondary | ICD-10-CM

## 2021-07-11 MED ORDER — PEGFILGRASTIM-CBQV 6 MG/0.6ML ~~LOC~~ SOSY
6.0000 mg | PREFILLED_SYRINGE | Freq: Once | SUBCUTANEOUS | Status: AC
  Administered 2021-07-11: 6 mg via SUBCUTANEOUS

## 2021-07-23 ENCOUNTER — Ambulatory Visit: Payer: 59 | Admitting: Rehabilitation

## 2021-07-28 ENCOUNTER — Other Ambulatory Visit: Payer: 59

## 2021-07-28 ENCOUNTER — Ambulatory Visit: Payer: 59 | Admitting: Hematology and Oncology

## 2021-07-28 ENCOUNTER — Ambulatory Visit: Payer: 59

## 2021-07-29 NOTE — Progress Notes (Signed)
Patient Care Team: Mike Craze, DO as PCP - General (Internal Medicine) Mauro Kaufmann, RN as Oncology Nurse Navigator Rockwell Germany, RN as Oncology Nurse Navigator  DIAGNOSIS:    ICD-10-CM   1. Malignant neoplasm of upper-outer quadrant of left breast in female, estrogen receptor positive ([place])  C50.412    Z17.0       SUMMARY OF ONCOLOGIC HISTORY: Oncology History  Malignant neoplasm of upper-outer quadrant of left breast in female, estrogen receptor positive ([place])  05/06/2021 Initial Diagnosis   Large circumscribed multicystic mass left breast 9 to 10 cm with multiple left axillary lymph nodes with cortical thickening: biopsy 3 o'clock position: Grade 2 IDC, ER 10%, PR 0%, Ki-67 60%, HER2 negative Right breast: 0.4 cm mass at 9:30 position: Benign on biopsy   05/12/2021 Cancer Staging   Staging form: Breast, AJCC 8th Edition - Clinical stage from 05/12/2021: Stage IIIB (cT3, cN1, cM0, G2, ER-, PR-, HER2-) - Signed by Nicholas Lose, MD on 05/12/2021  Stage prefix: Initial diagnosis  Histologic grading system: 3 grade system    05/30/2021 -  Chemotherapy    Patient is on Treatment Plan: BREAST PEMBROLIZUMAB + AC Q21D X 4 CYCLES FOLLOWED BY PEMBROLIZUMAB + CARBOPLATIN D1 + PACLITAXEL D1,8,15 Q21D X 4 CYCLES          CHIEF COMPLIANT: Cycle 4 Adriamycin, Cytoxan, Pembrolizumab  INTERVAL HISTORY: Sheena Mcdaniel is a 50 y.o. with above-mentioned history of left breast cancer currently on neoadjuvant chemotherapy with Adriamycin and Cytoxan along with pembrolizumab. She presents to the clinic today for cycle 4.   ALLERGIES:  has No Known Allergies.  MEDICATIONS:  Current Outpatient Medications  Medication Sig Dispense Refill   dexamethasone (DECADRON) 4 MG tablet Take 1 tablet day after chemo and 1 tablet 2 days after chemo with food 8 tablet 0   lidocaine-prilocaine (EMLA) cream Apply to affected area once 30 g 3   LORazepam (ATIVAN) 0.5 MG tablet Take 1 tablet (0.5 mg  total) by mouth at bedtime as needed for sleep. 30 tablet 0   ondansetron (ZOFRAN) 8 MG tablet Take 1 tablet (8 mg total) by mouth 2 (two) times daily as needed. Start on the third day after carboplatin and AC chemotherapy. 30 tablet 1   prochlorperazine (COMPAZINE) 10 MG tablet Take 1 tablet (10 mg total) by mouth every 6 (six) hours as needed (Nausea or vomiting). 30 tablet 1   traZODone (DESYREL) 50 MG tablet Take 1 tablet (50 mg total) by mouth at bedtime. 30 tablet 1   No current facility-administered medications for this visit.    PHYSICAL EXAMINATION: ECOG PERFORMANCE STATUS: 1 - Symptomatic but completely ambulatory  Vitals:   07/30/21 0946  BP: (!) 150/87  Pulse: 91  Resp: 18  Temp: 98.4 F (36.9 C)  SpO2: 98%   Filed Weights   07/30/21 0946  Weight: 159 lb 6.4 oz (72.3 kg)      LABORATORY DATA:  I have reviewed the data as listed CMP Latest Ref Rng & Units 07/09/2021 06/18/2021 06/05/2021  Glucose 70 - 99 mg/dL 98 103(H) 106(H)  BUN 6 - 20 mg/dL 16 15 13   Creatinine 0.44 - 1.00 mg/dL 0.83 0.79 0.70  Sodium 135 - 145 mmol/L 141 142 138  Potassium 3.5 - 5.1 mmol/L 4.3 4.2 4.4  Chloride 98 - 111 mmol/L 105 106 102  CO2 22 - 32 mmol/L 27 27 26   Calcium 8.9 - 10.3 mg/dL 9.7 9.3 9.3  Total Protein 6.5 - 8.1  g/dL 7.2 6.9 7.2  Total Bilirubin 0.3 - 1.2 mg/dL 0.3 0.2(L) 0.4  Alkaline Phos 38 - 126 U/L 55 62 114  AST 15 - 41 U/L 21 19 17   ALT 0 - 44 U/L 21 23 17     Lab Results  Component Value Date   WBC 6.3 07/30/2021   HGB 11.5 (L) 07/30/2021   HCT 34.4 (L) 07/30/2021   MCV 86.2 07/30/2021   PLT 405 (H) 07/30/2021   NEUTROABS 4.4 07/30/2021    ASSESSMENT & PLAN:  Malignant neoplasm of upper-outer quadrant of left breast in female, estrogen receptor positive ([place]) 05/06/2021: Large circumscribed multicystic mass left breast 9 to 10 cm with multiple left axillary lymph nodes with cortical thickening: biopsy 3 o'clock position: Grade 2 IDC, ER 10%, PR 0%, Ki-67 60%,  HER2 negative Right breast: 0.4 cm mass at 9:30 position: Benign on biopsy   Treatment plan: 1.  Neoadjuvant chemotherapy with Adriamycin and Cytoxan along with pembrolizumab followed by Taxol carboplatin and pembrolizumab (pembrolizumab maintenance for 1 year) 2. mastectomy with axillary lymph node dissection 3.  Adjuvant radiation 4.  Followed by adjuvant antiestrogen therapy (since she is 10% ER positive) ------------------------------------------------------------------------------------------------------------------------------------------------------ Current treatment: Cycle 1 day 8 Adriamycin and Cytoxan with pembrolizumab (this will start tomorrow) 05/20/2021 echocardiogram EF 60 to 65% 05/20/2021: CT CAP: Large left breast mass with deep involvement of left pectoralis muscle and overlying skin thickening, mild left axillary adenopathy.  No metastatic disease elsewhere 05/21/2021: Bone scan: No bone metastases 05/28/2021: Breast MRI: Large mixed cystic and solid enhancing mass involving outer left breast 10 x 7.3 x 8.5 cm posterior involving pectoralis muscle extends to the nipple mass and enhancement together 15 cm.  Left axillary lymphadenopathy. ------------------------------------------------------------------------------------------------------- Current Treatment; cycle 4 Adriamycin, Cytoxan and Pembrolizumab   Chemo Toxicities:  Fatigue that lasted for 1 week after each chemo Chemotherapy-induced anemia: Hemoglobin today is 11.5, monitoring Taste appears to go away for a week after treatment.  Did not take any antiemetics.   I discussed with her extensively about the subsequent treatment strategy with the Taxol Doristine Church. Return to clinic in 3 weeks for cycle 1 [place] Monitoring closely for toxicities.      No orders of the defined types were placed in this encounter.  The patient has a good understanding of the overall plan. she agrees with it. she will  call with any problems that may develop before the next visit here.  Total time spent: 30 mins including face to face time and time spent for planning, charting and coordination of care  Rulon Eisenmenger, MD, MPH 07/30/2021  I, Thana Ates, am acting as scribe for Dr. Nicholas Lose.  I have reviewed the above documentation for accuracy and completeness, and I agree with the above.

## 2021-07-29 NOTE — Assessment & Plan Note (Signed)
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%, HER2 negative Right breast: 0.4 cm mass at 9:30 position: Benign on biopsy  Treatment plan: 1.Neoadjuvant chemotherapy with Adriamycin and Cytoxan along with pembrolizumab followed by Taxol carboplatin and pembrolizumab (pembrolizumab maintenance for 1 year) 2.mastectomy withaxillary lymph nodedissection 3.Adjuvant radiation 4.Followed by adjuvant antiestrogen therapy (since she is 10% ER positive) ------------------------------------------------------------------------------------------------------------------------------------------------------ Current treatment: Cycle 1 day8Adriamycin and Cytoxan with pembrolizumab(this will start tomorrow) 05/20/2021 echocardiogram EF 60 to 65% 05/20/2021: CT CAP: Large left breast mass with deep involvement of left pectoralis muscle and overlying skin thickening, mild left axillary adenopathy. No metastatic disease elsewhere 05/21/2021: Bone scan: No bone metastases 05/28/2021: Breast MRI: Large mixed cystic and solid enhancing mass involving outer left breast 10 x 7.3 x 8.5 cm posterior involving pectoralis muscle extends to the nipple mass and enhancement together 15 cm. Left axillary lymphadenopathy. ------------------------------------------------------------------------------------------------------- Current Treatment; cycle 4 Adriamycin, Cytoxan and Pembrolizumab  Chemo Toxicities: Fatigue that lasted for 1 week after each chemo Chemotherapy-induced anemia: Hemoglobin today is 11.4, monitoring  I discussed with her extensively about the subsequent treatment strategy with the Taxol Doristine Church. Return to clinic in 3 weeks for cycle 4 Monitoring closely for toxicities.

## 2021-07-30 ENCOUNTER — Inpatient Hospital Stay (HOSPITAL_BASED_OUTPATIENT_CLINIC_OR_DEPARTMENT_OTHER): Payer: 59 | Admitting: Hematology and Oncology

## 2021-07-30 ENCOUNTER — Other Ambulatory Visit: Payer: Self-pay

## 2021-07-30 ENCOUNTER — Encounter: Payer: Self-pay | Admitting: *Deleted

## 2021-07-30 ENCOUNTER — Inpatient Hospital Stay: Payer: 59

## 2021-07-30 ENCOUNTER — Inpatient Hospital Stay: Payer: 59 | Attending: Hematology and Oncology

## 2021-07-30 ENCOUNTER — Ambulatory Visit: Payer: 59

## 2021-07-30 DIAGNOSIS — C50412 Malignant neoplasm of upper-outer quadrant of left female breast: Secondary | ICD-10-CM | POA: Diagnosis present

## 2021-07-30 DIAGNOSIS — Z5111 Encounter for antineoplastic chemotherapy: Secondary | ICD-10-CM | POA: Diagnosis present

## 2021-07-30 DIAGNOSIS — Z5112 Encounter for antineoplastic immunotherapy: Secondary | ICD-10-CM | POA: Insufficient documentation

## 2021-07-30 DIAGNOSIS — Z170 Estrogen receptor positive status [ER+]: Secondary | ICD-10-CM | POA: Insufficient documentation

## 2021-07-30 DIAGNOSIS — Z95828 Presence of other vascular implants and grafts: Secondary | ICD-10-CM

## 2021-07-30 DIAGNOSIS — Z5189 Encounter for other specified aftercare: Secondary | ICD-10-CM | POA: Diagnosis not present

## 2021-07-30 DIAGNOSIS — D6481 Anemia due to antineoplastic chemotherapy: Secondary | ICD-10-CM | POA: Diagnosis not present

## 2021-07-30 DIAGNOSIS — C773 Secondary and unspecified malignant neoplasm of axilla and upper limb lymph nodes: Secondary | ICD-10-CM | POA: Insufficient documentation

## 2021-07-30 DIAGNOSIS — T451X5A Adverse effect of antineoplastic and immunosuppressive drugs, initial encounter: Secondary | ICD-10-CM | POA: Diagnosis not present

## 2021-07-30 LAB — CMP (CANCER CENTER ONLY)
ALT: 35 U/L (ref 0–44)
AST: 32 U/L (ref 15–41)
Albumin: 4.1 g/dL (ref 3.5–5.0)
Alkaline Phosphatase: 58 U/L (ref 38–126)
Anion gap: 10 (ref 5–15)
BUN: 11 mg/dL (ref 6–20)
CO2: 26 mmol/L (ref 22–32)
Calcium: 10.3 mg/dL (ref 8.9–10.3)
Chloride: 106 mmol/L (ref 98–111)
Creatinine: 0.84 mg/dL (ref 0.44–1.00)
GFR, Estimated: >60 mL/min (ref >60)
Glucose, Bld: 106 mg/dL — ABNORMAL HIGH (ref 70–99)
Potassium: 4.5 mmol/L (ref 3.5–5.1)
Sodium: 142 mmol/L (ref 135–145)
Total Bilirubin: 0.4 mg/dL (ref 0.3–1.2)
Total Protein: 7.3 g/dL (ref 6.5–8.1)

## 2021-07-30 LAB — CBC WITH DIFFERENTIAL (CANCER CENTER ONLY)
Abs Immature Granulocytes: 0.03 10*3/uL (ref 0.00–0.07)
Basophils Absolute: 0.1 10*3/uL (ref 0.0–0.1)
Basophils Relative: 1 %
Eosinophils Absolute: 0.1 10*3/uL (ref 0.0–0.5)
Eosinophils Relative: 1 %
HCT: 34.4 % — ABNORMAL LOW (ref 36.0–46.0)
Hemoglobin: 11.5 g/dL — ABNORMAL LOW (ref 12.0–15.0)
Immature Granulocytes: 1 %
Lymphocytes Relative: 14 %
Lymphs Abs: 0.9 10*3/uL (ref 0.7–4.0)
MCH: 28.8 pg (ref 26.0–34.0)
MCHC: 33.4 g/dL (ref 30.0–36.0)
MCV: 86.2 fL (ref 80.0–100.0)
Monocytes Absolute: 0.8 10*3/uL (ref 0.1–1.0)
Monocytes Relative: 13 %
Neutro Abs: 4.4 10*3/uL (ref 1.7–7.7)
Neutrophils Relative %: 70 %
Platelet Count: 405 10*3/uL — ABNORMAL HIGH (ref 150–400)
RBC: 3.99 MIL/uL (ref 3.87–5.11)
RDW: 17.5 % — ABNORMAL HIGH (ref 11.5–15.5)
WBC Count: 6.3 10*3/uL (ref 4.0–10.5)
nRBC: 0 % (ref 0.0–0.2)

## 2021-07-30 LAB — TSH: TSH: 1.673 u[IU]/mL (ref 0.308–3.960)

## 2021-07-30 MED ORDER — SODIUM CHLORIDE 0.9 % IV SOLN
Freq: Once | INTRAVENOUS | Status: AC
  Filled 2021-07-30: qty 250

## 2021-07-30 MED ORDER — PALONOSETRON HCL INJECTION 0.25 MG/5ML
INTRAVENOUS | Status: AC
  Filled 2021-07-30: qty 5

## 2021-07-30 MED ORDER — SODIUM CHLORIDE 0.9 % IV SOLN
200.0000 mg | Freq: Once | INTRAVENOUS | Status: AC
  Administered 2021-07-30: 200 mg via INTRAVENOUS
  Filled 2021-07-30: qty 8

## 2021-07-30 MED ORDER — SODIUM CHLORIDE 0.9% FLUSH
10.0000 mL | INTRAVENOUS | Status: DC | PRN
  Administered 2021-07-30: 10 mL
  Filled 2021-07-30: qty 10

## 2021-07-30 MED ORDER — DOXORUBICIN HCL CHEMO IV INJECTION 2 MG/ML
60.0000 mg/m2 | Freq: Once | INTRAVENOUS | Status: AC
  Administered 2021-07-30: 104 mg via INTRAVENOUS
  Filled 2021-07-30: qty 52

## 2021-07-30 MED ORDER — SODIUM CHLORIDE 0.9 % IV SOLN
150.0000 mg | Freq: Once | INTRAVENOUS | Status: AC
  Administered 2021-07-30: 150 mg via INTRAVENOUS
  Filled 2021-07-30: qty 150

## 2021-07-30 MED ORDER — PALONOSETRON HCL INJECTION 0.25 MG/5ML
0.2500 mg | Freq: Once | INTRAVENOUS | Status: AC
  Administered 2021-07-30: 0.25 mg via INTRAVENOUS

## 2021-07-30 MED ORDER — SODIUM CHLORIDE 0.9% FLUSH
10.0000 mL | Freq: Once | INTRAVENOUS | Status: AC
Start: 2021-07-30 — End: 2021-07-30
  Administered 2021-07-30: 10 mL
  Filled 2021-07-30: qty 10

## 2021-07-30 MED ORDER — HEPARIN SOD (PORK) LOCK FLUSH 100 UNIT/ML IV SOLN
500.0000 [IU] | Freq: Once | INTRAVENOUS | Status: AC | PRN
  Administered 2021-07-30: 500 [IU]
  Filled 2021-07-30: qty 5

## 2021-07-30 MED ORDER — SODIUM CHLORIDE 0.9 % IV SOLN
600.0000 mg/m2 | Freq: Once | INTRAVENOUS | Status: AC
  Administered 2021-07-30: 1040 mg via INTRAVENOUS
  Filled 2021-07-30: qty 52

## 2021-07-30 MED ORDER — SODIUM CHLORIDE 0.9 % IV SOLN
10.0000 mg | Freq: Once | INTRAVENOUS | Status: AC
  Administered 2021-07-30: 10 mg via INTRAVENOUS
  Filled 2021-07-30: qty 10

## 2021-07-30 NOTE — Patient Instructions (Signed)
[facility]  Discharge Instructions: Thank you for choosing [facility] to provide your oncology and hematology care.   If you have a lab appointment with the [facility], please go directly to the [facility] and check in at the registration area.   Wear comfortable clothing and clothing appropriate for easy access to any Portacath or PICC line.   We strive to give you quality time with your provider. You may need to reschedule your appointment if you arrive late (15 or more minutes).  Arriving late affects you and other patients whose appointments are after yours.  Also, if you miss three or more appointments without notifying the office, you may be dismissed from the clinic at the provider's discretion.      For prescription refill requests, have your pharmacy contact our office and allow 72 hours for refills to be completed.    Today you received the following chemotherapy and/or immunotherapy agents Pembrolizomib, Doxorubicin, Cyclophosphamide.      To help prevent nausea and vomiting after your treatment, we encourage you to take your nausea medication as directed.  BELOW ARE SYMPTOMS THAT SHOULD BE REPORTED IMMEDIATELY: *FEVER GREATER THAN 100.4 F (38 C) OR HIGHER *CHILLS OR SWEATING *NAUSEA AND VOMITING THAT IS NOT CONTROLLED WITH YOUR NAUSEA MEDICATION *UNUSUAL SHORTNESS OF BREATH *UNUSUAL BRUISING OR BLEEDING *URINARY PROBLEMS (pain or burning when urinating, or frequent urination) *BOWEL PROBLEMS (unusual diarrhea, constipation, pain near the anus) TENDERNESS IN MOUTH AND THROAT WITH OR WITHOUT PRESENCE OF ULCERS (sore throat, sores in mouth, or a toothache) UNUSUAL RASH, SWELLING OR PAIN  UNUSUAL VAGINAL DISCHARGE OR ITCHING   Items with * indicate a potential emergency and should be followed up as soon as possible or go to the Emergency Department if any problems should occur.  Please show the CHEMOTHERAPY ALERT CARD or  IMMUNOTHERAPY ALERT CARD at check-in to the Emergency Department and triage nurse.  Should you have questions after your visit or need to cancel or reschedule your appointment, please contact [facility]  Dept: [phone]  and follow the prompts.  Office hours are 8:00 a.m. to 4:30 p.m. Monday - Friday. Please note that voicemails left after 4:00 p.m. may not be returned until the following business day.  We are closed weekends and major holidays. You have access to a nurse at all times for urgent questions. Please call the main number to the clinic Dept: [phone] and follow the prompts.   For any non-urgent questions, you may also contact your provider using MyChart. We now offer e-Visits for anyone 40 and older to request care online for non-urgent symptoms. For details visit [email or web address].   Also download the MyChart app! Go to the app store, search "MyChart", open the app, select [facility], and log in with your MyChart username and password.  Due to Covid, a mask is required upon entering the hospital/clinic. If you do not have a mask, one will be given to you upon arrival. For doctor visits, patients may have 1 support person aged 39 or older with them. For treatment visits, patients cannot have anyone with them due to current Covid guidelines and our immunocompromised population.

## 2021-07-31 LAB — T4: T4, Total: 11.6 ug/dL (ref 4.5–12.0)

## 2021-08-01 ENCOUNTER — Other Ambulatory Visit: Payer: Self-pay

## 2021-08-01 ENCOUNTER — Inpatient Hospital Stay: Payer: 59

## 2021-08-01 VITALS — BP 126/83 | HR 68 | Temp 98.3°F | Resp 18

## 2021-08-01 DIAGNOSIS — Z5112 Encounter for antineoplastic immunotherapy: Secondary | ICD-10-CM | POA: Diagnosis not present

## 2021-08-01 DIAGNOSIS — C50412 Malignant neoplasm of upper-outer quadrant of left female breast: Secondary | ICD-10-CM

## 2021-08-01 DIAGNOSIS — Z170 Estrogen receptor positive status [ER+]: Secondary | ICD-10-CM

## 2021-08-01 MED ORDER — PEGFILGRASTIM-CBQV 6 MG/0.6ML ~~LOC~~ SOSY
PREFILLED_SYRINGE | SUBCUTANEOUS | Status: AC
  Filled 2021-08-01: qty 0.6

## 2021-08-01 MED ORDER — PEGFILGRASTIM-CBQV 6 MG/0.6ML ~~LOC~~ SOSY
6.0000 mg | PREFILLED_SYRINGE | Freq: Once | SUBCUTANEOUS | Status: AC
  Administered 2021-08-01: 6 mg via SUBCUTANEOUS

## 2021-08-19 ENCOUNTER — Encounter: Payer: Self-pay | Admitting: *Deleted

## 2021-08-19 NOTE — Progress Notes (Signed)
Patient Care Team: Mike Craze, DO as PCP - General (Internal Medicine) Mauro Kaufmann, RN as Oncology Nurse Navigator Rockwell Germany, RN as Oncology Nurse Navigator  DIAGNOSIS:    ICD-10-CM   1. Malignant neoplasm of upper-outer quadrant of left breast in female, estrogen receptor positive ([place])  C50.412    Z17.0       SUMMARY OF ONCOLOGIC HISTORY: Oncology History  Malignant neoplasm of upper-outer quadrant of left breast in female, estrogen receptor positive ([place])  05/06/2021 Initial Diagnosis   Large circumscribed multicystic mass left breast 9 to 10 cm with multiple left axillary lymph nodes with cortical thickening: biopsy 3 o'clock position: Grade 2 IDC, ER 10%, PR 0%, Ki-67 60%, HER2 negative Right breast: 0.4 cm mass at 9:30 position: Benign on biopsy   05/12/2021 Cancer Staging   Staging form: Breast, AJCC 8th Edition - Clinical stage from 05/12/2021: Stage IIIB (cT3, cN1, cM0, G2, ER-, PR-, HER2-) - Signed by Nicholas Lose, MD on 05/12/2021 Stage prefix: Initial diagnosis Histologic grading system: 3 grade system   05/30/2021 -  Chemotherapy    Patient is on Treatment Plan: BREAST PEMBROLIZUMAB + AC Q21D X 4 CYCLES FOLLOWED BY PEMBROLIZUMAB + CARBOPLATIN D1 + PACLITAXEL D1,8,15 Q21D X 4 CYCLES          CHIEF COMPLIANT: Cycle 1 Taxol Carbo, Pembrolizumab  INTERVAL HISTORY: Sheena Mcdaniel is a 50 y.o. with above-mentioned history of left breast cancer currently on neoadjuvant chemotherapy with Adriamycin and Cytoxan along with pembrolizumab. She presents to the clinic today for cycle 1 Taxol, carboplatin and pembrolizumab.  Other than fatigue she is doing quite well.  She is ready for the next phase in the treatment.  Did not have any nausea vomiting or diarrhea.  ALLERGIES:  has No Known Allergies.  MEDICATIONS:  Current Outpatient Medications  Medication Sig Dispense Refill   lidocaine-prilocaine (EMLA) cream Apply to affected area once 30 g 3   LORazepam  (ATIVAN) 0.5 MG tablet Take 1 tablet (0.5 mg total) by mouth at bedtime as needed for sleep. 30 tablet 0   ondansetron (ZOFRAN) 8 MG tablet Take 1 tablet (8 mg total) by mouth 2 (two) times daily as needed. Start on the third day after carboplatin and AC chemotherapy. 30 tablet 1   prochlorperazine (COMPAZINE) 10 MG tablet Take 1 tablet (10 mg total) by mouth every 6 (six) hours as needed (Nausea or vomiting). 30 tablet 1   traZODone (DESYREL) 50 MG tablet Take 1 tablet (50 mg total) by mouth at bedtime. 30 tablet 1   No current facility-administered medications for this visit.    PHYSICAL EXAMINATION: ECOG PERFORMANCE STATUS: 1 - Symptomatic but completely ambulatory  Vitals:   08/20/21 0829  BP: (!) 158/94  Pulse: 85  Resp: 18  Temp: 97.9 F (36.6 C)  SpO2: 99%   Filed Weights   08/20/21 0829  Weight: 161 lb 14.4 oz (73.4 kg)    LABORATORY DATA:  I have reviewed the data as listed CMP Latest Ref Rng & Units 07/30/2021 07/09/2021 06/18/2021  Glucose 70 - 99 mg/dL 106(H) 98 103(H)  BUN 6 - 20 mg/dL 11 16 15   Creatinine 0.44 - 1.00 mg/dL 0.84 0.83 0.79  Sodium 135 - 145 mmol/L 142 141 142  Potassium 3.5 - 5.1 mmol/L 4.5 4.3 4.2  Chloride 98 - 111 mmol/L 106 105 106  CO2 22 - 32 mmol/L 26 27 27   Calcium 8.9 - 10.3 mg/dL 10.3 9.7 9.3  Total Protein  6.5 - 8.1 g/dL 7.3 7.2 6.9  Total Bilirubin 0.3 - 1.2 mg/dL 0.4 0.3 0.2(L)  Alkaline Phos 38 - 126 U/L 58 55 62  AST 15 - 41 U/L 32 21 19  ALT 0 - 44 U/L 35 21 23    Lab Results  Component Value Date   WBC 6.3 07/30/2021   HGB 11.5 (L) 07/30/2021   HCT 34.4 (L) 07/30/2021   MCV 86.2 07/30/2021   PLT 405 (H) 07/30/2021   NEUTROABS 4.4 07/30/2021    ASSESSMENT & PLAN:  Malignant neoplasm of upper-outer quadrant of left breast in female, estrogen receptor positive ([place]) 05/06/2021: Large circumscribed multicystic mass left breast 9 to 10 cm with multiple left axillary lymph nodes with cortical thickening: biopsy 3 o'clock  position: Grade 2 IDC, 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  Neoadjuvant chemotherapy with Adriamycin and Cytoxan along with pembrolizumab followed by Taxol carboplatin and pembrolizumab (pembrolizumab maintenance for 1 year) 2. mastectomy with axillary lymph node dissection 3.  Adjuvant radiation 4.  Followed by adjuvant antiestrogen therapy (since she is 10% ER positive) ------------------------------------------------------------------------------------------------------------------------------------------------------ Current treatment: Cycle 1 day 8 Adriamycin and Cytoxan with pembrolizumab (this will start tomorrow) 05/20/2021 echocardiogram EF 60 to 65% 05/20/2021: CT CAP: Large left breast mass with deep involvement of left pectoralis muscle and overlying skin thickening, mild left axillary adenopathy.  No metastatic disease elsewhere 05/21/2021: Bone scan: No bone metastases 05/28/2021: Breast MRI: Large mixed cystic and solid enhancing mass involving outer left breast 10 x 7.3 x 8.5 cm posterior involving pectoralis muscle extends to the nipple mass and enhancement together 15 cm.  Left axillary lymphadenopathy. ------------------------------------------------------------------------------------------------------- Current Treatment; completed 4 cycles of Adriamycin, Cytoxan and Pembrolizumab, Today is cycle 1 Taxol, carbo Keytruda   Chemo Toxicities:  Fatigue that lasted for 1 week after each chemo Chemotherapy-induced anemia: Hemoglobin today is 11.5, monitoring Taste appears to go away for a week after treatment.   Did not take any antiemetics.   I discussed with her extensively about the subsequent treatment strategy with the Taxol Doristine Church. Return to clinic in 1 week for cycle 2 Taxol Monitoring closely for toxicities.      No orders of the defined types were placed in this  encounter.  The patient has a good understanding of the overall plan. she agrees with it. she will call with any problems that may develop before the next visit here.  Total time spent: 30 mins including face to face time and time spent for planning, charting and coordination of care  Rulon Eisenmenger, MD, MPH 08/20/2021  I, Thana Ates, am acting as scribe for Dr. Nicholas Lose.  I have reviewed the above documentation for accuracy and completeness, and I agree with the above.

## 2021-08-19 NOTE — Assessment & Plan Note (Signed)
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%, HER2 negative Right breast: 0.4 cm mass at 9:30 position: Benign on biopsy  Treatment plan: 1.Neoadjuvant chemotherapy with Adriamycin and Cytoxan along with pembrolizumab followed by Taxol carboplatin and pembrolizumab (pembrolizumab maintenance for 1 year) 2.mastectomy withaxillary lymph nodedissection 3.Adjuvant radiation 4.Followed by adjuvant antiestrogen therapy (since she is 10% ER positive) ------------------------------------------------------------------------------------------------------------------------------------------------------ Current treatment: Cycle 1 day8Adriamycin and Cytoxan with pembrolizumab(this will start tomorrow) 05/20/2021 echocardiogram EF 60 to 65% 05/20/2021: CT CAP: Large left breast mass with deep involvement of left pectoralis muscle and overlying skin thickening, mild left axillary adenopathy. No metastatic disease elsewhere 05/21/2021: Bone scan: No bone metastases 05/28/2021: Breast MRI: Large mixed cystic and solid enhancing mass involving outer left breast 10 x 7.3 x 8.5 cm posterior involving pectoralis muscle extends to the nipple mass and enhancement together 15 cm. Left axillary lymphadenopathy. ------------------------------------------------------------------------------------------------------- Current Treatment; completed 4 cycles ofAdriamycin, Cytoxan and Pembrolizumab, Today is cycle 1 Taxol, carbo Keytruda  Chemo Toxicities: Fatigue that lasted for 1 week after each chemo Chemotherapy-induced anemia: Hemoglobin today is 11.5, monitoring Taste appears to go away for a week after treatment.  Did not take any antiemetics.  I discussed with her extensively about the subsequent treatment strategy with the Taxol Doristine Church. Return to clinic in 1 week for cycle 2 Taxol Monitoring closely for toxicities.

## 2021-08-20 ENCOUNTER — Inpatient Hospital Stay: Payer: 59

## 2021-08-20 ENCOUNTER — Inpatient Hospital Stay (HOSPITAL_BASED_OUTPATIENT_CLINIC_OR_DEPARTMENT_OTHER): Payer: 59 | Admitting: Hematology and Oncology

## 2021-08-20 ENCOUNTER — Other Ambulatory Visit: Payer: Self-pay

## 2021-08-20 VITALS — BP 150/91 | HR 81

## 2021-08-20 DIAGNOSIS — Z95828 Presence of other vascular implants and grafts: Secondary | ICD-10-CM

## 2021-08-20 DIAGNOSIS — C50412 Malignant neoplasm of upper-outer quadrant of left female breast: Secondary | ICD-10-CM

## 2021-08-20 DIAGNOSIS — Z170 Estrogen receptor positive status [ER+]: Secondary | ICD-10-CM

## 2021-08-20 DIAGNOSIS — Z5112 Encounter for antineoplastic immunotherapy: Secondary | ICD-10-CM | POA: Diagnosis not present

## 2021-08-20 LAB — CBC WITH DIFFERENTIAL (CANCER CENTER ONLY)
Abs Immature Granulocytes: 0.05 10*3/uL (ref 0.00–0.07)
Basophils Absolute: 0.1 10*3/uL (ref 0.0–0.1)
Basophils Relative: 1 %
Eosinophils Absolute: 0.1 10*3/uL (ref 0.0–0.5)
Eosinophils Relative: 1 %
HCT: 32.4 % — ABNORMAL LOW (ref 36.0–46.0)
Hemoglobin: 11.1 g/dL — ABNORMAL LOW (ref 12.0–15.0)
Immature Granulocytes: 1 %
Lymphocytes Relative: 10 %
Lymphs Abs: 0.7 10*3/uL (ref 0.7–4.0)
MCH: 30.3 pg (ref 26.0–34.0)
MCHC: 34.3 g/dL (ref 30.0–36.0)
MCV: 88.5 fL (ref 80.0–100.0)
Monocytes Absolute: 0.9 10*3/uL (ref 0.1–1.0)
Monocytes Relative: 13 %
Neutro Abs: 5.3 10*3/uL (ref 1.7–7.7)
Neutrophils Relative %: 74 %
Platelet Count: 461 10*3/uL — ABNORMAL HIGH (ref 150–400)
RBC: 3.66 MIL/uL — ABNORMAL LOW (ref 3.87–5.11)
RDW: 17.8 % — ABNORMAL HIGH (ref 11.5–15.5)
WBC Count: 7.1 10*3/uL (ref 4.0–10.5)
nRBC: 0 % (ref 0.0–0.2)

## 2021-08-20 LAB — CMP (CANCER CENTER ONLY)
ALT: 31 U/L (ref 0–44)
AST: 30 U/L (ref 15–41)
Albumin: 4 g/dL (ref 3.5–5.0)
Alkaline Phosphatase: 62 U/L (ref 38–126)
Anion gap: 9 (ref 5–15)
BUN: 15 mg/dL (ref 6–20)
CO2: 27 mmol/L (ref 22–32)
Calcium: 9.7 mg/dL (ref 8.9–10.3)
Chloride: 106 mmol/L (ref 98–111)
Creatinine: 0.81 mg/dL (ref 0.44–1.00)
GFR, Estimated: >60 mL/min (ref >60)
Glucose, Bld: 105 mg/dL — ABNORMAL HIGH (ref 70–99)
Potassium: 4 mmol/L (ref 3.5–5.1)
Sodium: 142 mmol/L (ref 135–145)
Total Bilirubin: 0.4 mg/dL (ref 0.3–1.2)
Total Protein: 7.1 g/dL (ref 6.5–8.1)

## 2021-08-20 LAB — TSH: TSH: 2.644 u[IU]/mL (ref 0.308–3.960)

## 2021-08-20 MED ORDER — SODIUM CHLORIDE 0.9 % IV SOLN
10.0000 mg | Freq: Once | INTRAVENOUS | Status: AC
  Administered 2021-08-20: 10 mg via INTRAVENOUS
  Filled 2021-08-20: qty 10

## 2021-08-20 MED ORDER — HEPARIN SOD (PORK) LOCK FLUSH 100 UNIT/ML IV SOLN
500.0000 [IU] | Freq: Once | INTRAVENOUS | Status: AC | PRN
  Administered 2021-08-20: 500 [IU]

## 2021-08-20 MED ORDER — SODIUM CHLORIDE 0.9 % IV SOLN
200.0000 mg | Freq: Once | INTRAVENOUS | Status: AC
Start: 2021-08-20 — End: 2021-08-20
  Administered 2021-08-20: 200 mg via INTRAVENOUS
  Filled 2021-08-20: qty 8

## 2021-08-20 MED ORDER — SODIUM CHLORIDE 0.9% FLUSH
10.0000 mL | INTRAVENOUS | Status: DC | PRN
  Administered 2021-08-20: 10 mL

## 2021-08-20 MED ORDER — SODIUM CHLORIDE 0.9 % IV SOLN
600.0000 mg | Freq: Once | INTRAVENOUS | Status: AC
  Administered 2021-08-20: 600 mg via INTRAVENOUS
  Filled 2021-08-20: qty 60

## 2021-08-20 MED ORDER — SODIUM CHLORIDE 0.9 % IV SOLN: Freq: Once | INTRAVENOUS | Status: AC

## 2021-08-20 MED ORDER — SODIUM CHLORIDE 0.9 % IV SOLN
150.0000 mg | Freq: Once | INTRAVENOUS | Status: AC
  Administered 2021-08-20: 150 mg via INTRAVENOUS
  Filled 2021-08-20: qty 150

## 2021-08-20 MED ORDER — SODIUM CHLORIDE 0.9 % IV SOLN
80.0000 mg/m2 | Freq: Once | INTRAVENOUS | Status: AC
  Administered 2021-08-20: 138 mg via INTRAVENOUS
  Filled 2021-08-20: qty 23

## 2021-08-20 MED ORDER — SODIUM CHLORIDE 0.9% FLUSH
10.0000 mL | Freq: Once | INTRAVENOUS | Status: AC
  Administered 2021-08-20: 10 mL

## 2021-08-20 MED ORDER — FAMOTIDINE 20 MG IN NS 100 ML IVPB
20.0000 mg | Freq: Once | INTRAVENOUS | Status: AC
  Administered 2021-08-20: 20 mg via INTRAVENOUS
  Filled 2021-08-20: qty 100

## 2021-08-20 MED ORDER — PALONOSETRON HCL INJECTION 0.25 MG/5ML
0.2500 mg | Freq: Once | INTRAVENOUS | Status: AC
  Administered 2021-08-20: 0.25 mg via INTRAVENOUS
  Filled 2021-08-20: qty 5

## 2021-08-20 MED ORDER — DIPHENHYDRAMINE HCL 50 MG/ML IJ SOLN
50.0000 mg | Freq: Once | INTRAMUSCULAR | Status: AC
  Administered 2021-08-20: 50 mg via INTRAVENOUS
  Filled 2021-08-20: qty 1

## 2021-08-20 NOTE — Patient Instructions (Signed)
[facility] MEDICAL ONCOLOGY  Discharge Instructions: Thank you for choosing [facility] to provide your oncology and hematology care.   If you have a lab appointment with the Cancer Center, please go directly to the Cancer Center and check in at the registration area.   Wear comfortable clothing and clothing appropriate for easy access to any Portacath or PICC line.   We strive to give you quality time with your provider. You may need to reschedule your appointment if you arrive late (15 or more minutes).  Arriving late affects you and other patients whose appointments are after yours.  Also, if you miss three or more appointments without notifying the office, you may be dismissed from the clinic at the provider's discretion.      For prescription refill requests, have your pharmacy contact our office and allow 72 hours for refills to be completed.    Today you received the following chemotherapy and/or immunotherapy agents: Keytruda, Taxol, Carboplatin     To help prevent nausea and vomiting after your treatment, we encourage you to take your nausea medication as directed.  BELOW ARE SYMPTOMS THAT SHOULD BE REPORTED IMMEDIATELY: . *FEVER GREATER THAN 100.4 F (38 C) OR HIGHER . *CHILLS OR SWEATING . *NAUSEA AND VOMITING THAT IS NOT CONTROLLED WITH YOUR NAUSEA MEDICATION . *UNUSUAL SHORTNESS OF BREATH . *UNUSUAL BRUISING OR BLEEDING . *URINARY PROBLEMS (pain or burning when urinating, or frequent urination) . *BOWEL PROBLEMS (unusual diarrhea, constipation, pain near the anus) . TENDERNESS IN MOUTH AND THROAT WITH OR WITHOUT PRESENCE OF ULCERS (sore throat, sores in mouth, or a toothache) . UNUSUAL RASH, SWELLING OR PAIN  . UNUSUAL VAGINAL DISCHARGE OR ITCHING   Items with * indicate a potential emergency and should be followed up as soon as possible or go to the Emergency Department if any problems should occur.  Please show the CHEMOTHERAPY ALERT CARD or  IMMUNOTHERAPY ALERT CARD at check-in to the Emergency Department and triage nurse.  Should you have questions after your visit or need to cancel or reschedule your appointment, please contact [facility]  Dept: [phone]  and follow the prompts.  Office hours are 8:00 a.m. to 4:30 p.m. Monday - Friday. Please note that voicemails left after 4:00 p.m. may not be returned until the following business day.  We are closed weekends and major holidays. You have access to a nurse at all times for urgent questions. Please call the main number to the clinic Dept: [phone] and follow the prompts.   For any non-urgent questions, you may also contact your provider using MyChart. We now offer e-Visits for anyone 18 and older to request care online for non-urgent symptoms. For details visit [email or web address].   Also download the MyChart app! Go to the app store, search "MyChart", open the app, select [facility], and log in with your MyChart username and password.  Due to Covid, a mask is required upon entering the hospital/clinic. If you do not have a mask, one will be given to you upon arrival. For doctor visits, patients may have 1 support person aged 18 or older with them. For treatment visits, patients cannot have anyone with them due to current Covid guidelines and our immunocompromised population.   

## 2021-08-21 LAB — T4: T4, Total: 9.8 ug/dL (ref 4.5–12.0)

## 2021-08-26 NOTE — Progress Notes (Signed)
 Patient Care Team: Rehman, Areeg N, DO as PCP - General (Internal Medicine) Stuart, Dawn C, RN as Oncology Nurse Navigator Martini, Keisha N, RN as Oncology Nurse Navigator  DIAGNOSIS:    ICD-10-CM   1. Malignant neoplasm of upper-outer quadrant of left breast in female, estrogen receptor positive ([facility])  C50.412    Z17.0       SUMMARY OF ONCOLOGIC HISTORY: Oncology History  Malignant neoplasm of upper-outer quadrant of left breast in female, estrogen receptor positive ([facility])  05/06/2021 Initial Diagnosis   Large circumscribed multicystic mass left breast 9 to 10 cm with multiple left axillary lymph nodes with cortical thickening: biopsy 3 o'clock position: Grade 2 IDC, ER 10%, PR 0%, Ki-67 60%, HER2 negative Right breast: 0.4 cm mass at 9:30 position: Benign on biopsy   05/12/2021 Cancer Staging   Staging form: Breast, AJCC 8th Edition - Clinical stage from 05/12/2021: Stage IIIB (cT3, cN1, cM0, G2, ER-, PR-, HER2-) - Signed by Gudena, Vinay, MD on 05/12/2021 Stage prefix: Initial diagnosis Histologic grading system: 3 grade system   05/30/2021 -  Chemotherapy    Patient is on Treatment Plan: BREAST PEMBROLIZUMAB + AC Q21D X 4 CYCLES FOLLOWED BY PEMBROLIZUMAB + CARBOPLATIN D1 + PACLITAXEL D1,8,15 Q21D X 4 CYCLES          CHIEF COMPLIANT: Cycle 2 weekly Taxol (Carbo, Pembrolizumab every 3 weeks)  INTERVAL HISTORY: Sheena Mcdaniel is a 50 y.o. with above-mentioned history of left breast cancer currently on neoadjuvant chemotherapy with Adriamycin and Cytoxan along with pembrolizumab. She presents to the clinic today for day 8 Cycle 2 weekly Taxol (Carbo, Pembrolizumab every 3 weeks).  She has tolerated last week's treatment extremely well without any major problems or concerns.  She slept for significant amount of time on the day of the treatment probably because of Benadryl.  For 3 days she was slightly tired but then she recovered and she feels fine today.  Denies any fevers or  chills.  Excellent energy levels today.  Taste appears to be slightly off.  ALLERGIES:  has No Known Allergies.  MEDICATIONS:  Current Outpatient Medications  Medication Sig Dispense Refill   lidocaine-prilocaine (EMLA) cream Apply to affected area once 30 g 3   LORazepam (ATIVAN) 0.5 MG tablet Take 1 tablet (0.5 mg total) by mouth at bedtime as needed for sleep. 30 tablet 0   ondansetron (ZOFRAN) 8 MG tablet Take 1 tablet (8 mg total) by mouth 2 (two) times daily as needed. Start on the third day after carboplatin and AC chemotherapy. 30 tablet 1   prochlorperazine (COMPAZINE) 10 MG tablet Take 1 tablet (10 mg total) by mouth every 6 (six) hours as needed (Nausea or vomiting). 30 tablet 1   traZODone (DESYREL) 50 MG tablet Take 1 tablet (50 mg total) by mouth at bedtime. 30 tablet 1   No current facility-administered medications for this visit.    PHYSICAL EXAMINATION: ECOG PERFORMANCE STATUS: 1 - Symptomatic but completely ambulatory  Vitals:   08/27/21 0822  BP: (!) 151/88  Pulse: 89  Resp: 18  Temp: 97.7 F (36.5 C)  SpO2: 100%   Filed Weights   08/27/21 0822  Weight: 159 lb 1.6 oz (72.2 kg)    LABORATORY DATA:  I have reviewed the data as listed CMP Latest Ref Rng & Units 08/20/2021 07/30/2021 07/09/2021  Glucose 70 - 99 mg/dL 105(H) 106(H) 98  BUN 6 - 20 mg/dL 15 11 16  Creatinine 0.44 - 1.00 mg/dL 0.81 0.84 0.83    Sodium 135 - 145 mmol/L 142 142 141  Potassium 3.5 - 5.1 mmol/L 4.0 4.5 4.3  Chloride 98 - 111 mmol/L 106 106 105  CO2 22 - 32 mmol/L 27 26 27  Calcium 8.9 - 10.3 mg/dL 9.7 10.3 9.7  Total Protein 6.5 - 8.1 g/dL 7.1 7.3 7.2  Total Bilirubin 0.3 - 1.2 mg/dL 0.4 0.4 0.3  Alkaline Phos 38 - 126 U/L 62 58 55  AST 15 - 41 U/L 30 32 21  ALT 0 - 44 U/L 31 35 21    Lab Results  Component Value Date   WBC 2.1 (L) 08/27/2021   HGB 10.5 (L) 08/27/2021   HCT 30.5 (L) 08/27/2021   MCV 87.6 08/27/2021   PLT 249 08/27/2021   NEUTROABS 1.3 (L) 08/27/2021     ASSESSMENT & PLAN:  Malignant neoplasm of upper-outer quadrant of left breast in female, estrogen receptor positive ([facility]) 05/06/2021: Large circumscribed multicystic mass left breast 9 to 10 cm with multiple left axillary lymph nodes with cortical thickening: biopsy 3 o'clock position: Grade 2 IDC, 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  Neoadjuvant chemotherapy with Adriamycin and Cytoxan along with pembrolizumab followed by Taxol carboplatin and pembrolizumab (pembrolizumab maintenance for 1 year) 2. mastectomy with axillary lymph node dissection 3.  Adjuvant radiation 4.  Followed by adjuvant antiestrogen therapy (since she is 10% ER positive) ------------------------------------------------------------------------------------------------------------------------------------------------------ Current treatment: Cycle 1 day 8 Adriamycin and Cytoxan with pembrolizumab (this will start tomorrow) 05/20/2021 echocardiogram EF 60 to 65% 05/20/2021: CT CAP: Large left breast mass with deep involvement of left pectoralis muscle and overlying skin thickening, mild left axillary adenopathy.  No metastatic disease elsewhere 05/21/2021: Bone scan: No bone metastases 05/28/2021: Breast MRI: Large mixed cystic and solid enhancing mass involving outer left breast 10 x 7.3 x 8.5 cm posterior involving pectoralis muscle extends to the nipple mass and enhancement together 15 cm.  Left axillary lymphadenopathy. ------------------------------------------------------------------------------------------------------- Current Treatment; completed 4 cycles of Adriamycin, Cytoxan and Pembrolizumab, Today is cycle 2 Taxol (carbo Keytruda every 3 weeks)   Chemo Toxicities:  1. Fatigue 2 to 3 days after chemo 2. Chemotherapy-induced anemia: Hemoglobin today is 10.5, monitoring 3. Taste appears to go away for a week after treatment. 4.   Leukopenia: We will start giving her Granix injections on Tuesdays before each treatment.  Okay to treat with an ANC of 1.2 today.    Monitoring closely for toxicities. 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  Return to clinic weekly for Taxol and in 2 weeks for follow-up with me    No orders of the defined types were placed in this encounter.  The patient has a good understanding of the overall plan. she agrees with it. she will call with any problems that may develop before the next visit here.  Total time spent: 30 mins including face to face time and time spent for planning, charting and coordination of care  Vinay K Gudena, MD, MPH 08/27/2021  I, Kirstyn Evans, am acting as scribe for Dr. Vinay Gudena.  I have reviewed the above documentation for accuracy and completeness, and I agree with the above.       

## 2021-08-27 ENCOUNTER — Encounter: Payer: Self-pay | Admitting: *Deleted

## 2021-08-27 ENCOUNTER — Inpatient Hospital Stay (HOSPITAL_BASED_OUTPATIENT_CLINIC_OR_DEPARTMENT_OTHER): Payer: 59 | Admitting: Hematology and Oncology

## 2021-08-27 ENCOUNTER — Encounter (HOSPITAL_COMMUNITY): Payer: Self-pay | Admitting: Emergency Medicine

## 2021-08-27 ENCOUNTER — Inpatient Hospital Stay: Payer: 59

## 2021-08-27 ENCOUNTER — Inpatient Hospital Stay: Payer: 59 | Attending: Hematology and Oncology

## 2021-08-27 ENCOUNTER — Emergency Department (HOSPITAL_COMMUNITY)
Admission: EM | Admit: 2021-08-27 | Discharge: 2021-08-27 | Disposition: A | Discharge status: Home / Self Care | Payer: 59 | Attending: Emergency Medicine | Admitting: Emergency Medicine

## 2021-08-27 ENCOUNTER — Other Ambulatory Visit: Payer: Self-pay

## 2021-08-27 VITALS — BP 145/98

## 2021-08-27 DIAGNOSIS — R300 Dysuria: Secondary | ICD-10-CM | POA: Insufficient documentation

## 2021-08-27 DIAGNOSIS — Z87891 Personal history of nicotine dependence: Secondary | ICD-10-CM | POA: Insufficient documentation

## 2021-08-27 DIAGNOSIS — Z95828 Presence of other vascular implants and grafts: Secondary | ICD-10-CM

## 2021-08-27 DIAGNOSIS — Z5189 Encounter for other specified aftercare: Secondary | ICD-10-CM | POA: Insufficient documentation

## 2021-08-27 DIAGNOSIS — C50412 Malignant neoplasm of upper-outer quadrant of left female breast: Secondary | ICD-10-CM

## 2021-08-27 DIAGNOSIS — R000 Tachycardia, unspecified: Secondary | ICD-10-CM | POA: Diagnosis not present

## 2021-08-27 DIAGNOSIS — Z853 Personal history of malignant neoplasm of breast: Secondary | ICD-10-CM | POA: Insufficient documentation

## 2021-08-27 DIAGNOSIS — Z5112 Encounter for antineoplastic immunotherapy: Secondary | ICD-10-CM | POA: Insufficient documentation

## 2021-08-27 DIAGNOSIS — T451X5A Adverse effect of antineoplastic and immunosuppressive drugs, initial encounter: Secondary | ICD-10-CM | POA: Insufficient documentation

## 2021-08-27 DIAGNOSIS — D6481 Anemia due to antineoplastic chemotherapy: Secondary | ICD-10-CM | POA: Insufficient documentation

## 2021-08-27 DIAGNOSIS — Z170 Estrogen receptor positive status [ER+]: Secondary | ICD-10-CM

## 2021-08-27 DIAGNOSIS — R350 Frequency of micturition: Secondary | ICD-10-CM | POA: Diagnosis not present

## 2021-08-27 DIAGNOSIS — Z5111 Encounter for antineoplastic chemotherapy: Secondary | ICD-10-CM | POA: Insufficient documentation

## 2021-08-27 DIAGNOSIS — D701 Agranulocytosis secondary to cancer chemotherapy: Secondary | ICD-10-CM | POA: Insufficient documentation

## 2021-08-27 DIAGNOSIS — Z79899 Other long term (current) drug therapy: Secondary | ICD-10-CM | POA: Insufficient documentation

## 2021-08-27 LAB — CBC WITH DIFFERENTIAL (CANCER CENTER ONLY)
Abs Immature Granulocytes: 0.01 10*3/uL (ref 0.00–0.07)
Basophils Absolute: 0 10*3/uL (ref 0.0–0.1)
Basophils Relative: 2 %
Eosinophils Absolute: 0.1 10*3/uL (ref 0.0–0.5)
Eosinophils Relative: 4 %
HCT: 30.5 % — ABNORMAL LOW (ref 36.0–46.0)
Hemoglobin: 10.5 g/dL — ABNORMAL LOW (ref 12.0–15.0)
Immature Granulocytes: 1 %
Lymphocytes Relative: 20 %
Lymphs Abs: 0.4 10*3/uL — ABNORMAL LOW (ref 0.7–4.0)
MCH: 30.2 pg (ref 26.0–34.0)
MCHC: 34.4 g/dL (ref 30.0–36.0)
MCV: 87.6 fL (ref 80.0–100.0)
Monocytes Absolute: 0.3 10*3/uL (ref 0.1–1.0)
Monocytes Relative: 13 %
Neutro Abs: 1.3 10*3/uL — ABNORMAL LOW (ref 1.7–7.7)
Neutrophils Relative %: 60 %
Platelet Count: 249 10*3/uL (ref 150–400)
RBC: 3.48 MIL/uL — ABNORMAL LOW (ref 3.87–5.11)
RDW: 16.7 % — ABNORMAL HIGH (ref 11.5–15.5)
WBC Count: 2.1 10*3/uL — ABNORMAL LOW (ref 4.0–10.5)
nRBC: 0 % (ref 0.0–0.2)

## 2021-08-27 LAB — CMP (CANCER CENTER ONLY)
ALT: 39 U/L (ref 0–44)
AST: 28 U/L (ref 15–41)
Albumin: 3.9 g/dL (ref 3.5–5.0)
Alkaline Phosphatase: 52 U/L (ref 38–126)
Anion gap: 7 (ref 5–15)
BUN: 13 mg/dL (ref 6–20)
CO2: 27 mmol/L (ref 22–32)
Calcium: 9.6 mg/dL (ref 8.9–10.3)
Chloride: 106 mmol/L (ref 98–111)
Creatinine: 0.71 mg/dL (ref 0.44–1.00)
GFR, Estimated: >60 mL/min (ref >60)
Glucose, Bld: 98 mg/dL (ref 70–99)
Potassium: 4.4 mmol/L (ref 3.5–5.1)
Sodium: 140 mmol/L (ref 135–145)
Total Bilirubin: 0.4 mg/dL (ref 0.3–1.2)
Total Protein: 6.9 g/dL (ref 6.5–8.1)

## 2021-08-27 LAB — URINALYSIS, ROUTINE W REFLEX MICROSCOPIC
Bilirubin Urine: NEGATIVE
Glucose, UA: NEGATIVE mg/dL
Ketones, ur: NEGATIVE mg/dL
Leukocytes,Ua: NEGATIVE
Nitrite: NEGATIVE
Protein, ur: NEGATIVE mg/dL
Specific Gravity, Urine: <1.005 — ABNORMAL LOW (ref 1.005–1.030)
pH: 6.5 (ref 5.0–8.0)

## 2021-08-27 LAB — TSH: TSH: 2.643 u[IU]/mL (ref 0.308–3.960)

## 2021-08-27 MED ORDER — HEPARIN SOD (PORK) LOCK FLUSH 100 UNIT/ML IV SOLN: 500.0000 [IU] | Freq: Once | INTRAVENOUS | Status: AC

## 2021-08-27 MED ORDER — CEPHALEXIN 500 MG PO CAPS
500.0000 mg | ORAL_CAPSULE | Freq: Once | ORAL | Status: AC
  Administered 2021-08-27: 500 mg via ORAL
  Filled 2021-08-27: qty 1

## 2021-08-27 MED ORDER — CEPHALEXIN 500 MG PO CAPS: 500.0000 mg | ORAL_CAPSULE | Freq: Four times a day (QID) | ORAL | 0 refills | Status: AC

## 2021-08-27 MED ORDER — SODIUM CHLORIDE 0.9 % IV SOLN: Freq: Once | INTRAVENOUS | Status: AC

## 2021-08-27 MED ORDER — SODIUM CHLORIDE 0.9% FLUSH
10.0000 mL | INTRAVENOUS | Status: DC | PRN
  Administered 2021-08-27: 10 mL

## 2021-08-27 MED ORDER — SODIUM CHLORIDE 0.9% FLUSH
10.0000 mL | Freq: Once | INTRAVENOUS | Status: AC
  Administered 2021-08-27: 10 mL

## 2021-08-27 MED ORDER — SODIUM CHLORIDE 0.9% FLUSH: 10.0000 mL | Freq: Once | INTRAVENOUS | Status: AC

## 2021-08-27 MED ORDER — DIPHENHYDRAMINE HCL 50 MG/ML IJ SOLN
50.0000 mg | Freq: Once | INTRAMUSCULAR | Status: AC
  Administered 2021-08-27: 50 mg via INTRAVENOUS
  Filled 2021-08-27: qty 1

## 2021-08-27 MED ORDER — HEPARIN SOD (PORK) LOCK FLUSH 100 UNIT/ML IV SOLN
500.0000 [IU] | Freq: Once | INTRAVENOUS | Status: AC | PRN
  Administered 2021-08-27: 500 [IU]

## 2021-08-27 MED ORDER — FAMOTIDINE 20 MG IN NS 100 ML IVPB
20.0000 mg | Freq: Once | INTRAVENOUS | Status: AC
  Administered 2021-08-27: 20 mg via INTRAVENOUS
  Filled 2021-08-27: qty 100

## 2021-08-27 MED ORDER — SODIUM CHLORIDE 0.9 % IV SOLN
80.0000 mg/m2 | Freq: Once | INTRAVENOUS | Status: AC
  Administered 2021-08-27: 138 mg via INTRAVENOUS
  Filled 2021-08-27: qty 23

## 2021-08-27 MED ORDER — SODIUM CHLORIDE 0.9 % IV SOLN
10.0000 mg | Freq: Once | INTRAVENOUS | Status: AC
  Administered 2021-08-27: 10 mg via INTRAVENOUS
  Filled 2021-08-27: qty 10

## 2021-08-27 NOTE — ED Triage Notes (Signed)
Pt states that she is undergoing CA treatments and started having dysuria. Alert and oriented.

## 2021-08-27 NOTE — ED Provider Notes (Signed)
Emergency Medicine Provider Triage Evaluation Note  Sheena Mcdaniel , a 50 y.o. female  was evaluated in triage.  Pt complains of burning with urination   Review of Systems  Positive: frequency Negative: Fever   Physical Exam  BP (!) 165/104 (BP Location: Left Arm)   Pulse (!) 130   Temp 97.7 F (36.5 C) (Oral)   Resp 20   SpO2 100%  Gen:   Awake, no distress   Resp:  Normal effort  MSK:   Moves extremities without difficulty  Other:    Medical Decision Making  Medically screening exam initiated at 5:39 PM.  Appropriate orders placed.  Sheena Mcdaniel was informed that the remainder of the evaluation will be completed by another provider, this initial triage assessment does not replace that evaluation, and the importance of remaining in the ED until their evaluation is complete.     Sheena Mcdaniel, Sheena Mcdaniel 08/27/21 1740    Sheena Rank, MD 08/28/21 [phone]

## 2021-08-27 NOTE — ED Provider Notes (Signed)
[facility] DEPT Provider Note   CSN: [id number] Arrival date & time: 08/27/21  1701     History Chief Complaint  Patient presents with   Dysuria    Sheena Mcdaniel is a 50 y.o. female.  Pt has breast cancer and is currently receiving chemotherapy.  Pt reports she drinks a lot of water Pt reports she was seen today in the oncology clinic and had blood work.  Pt reports she did not ask about urinary symptoms   The history is provided by the patient. No language interpreter was used.  Dysuria Pain quality:  Aching Pain severity:  Moderate Onset quality:  Gradual Duration:  2 days Timing:  Constant Progression:  Worsening Chronicity:  New Recent urinary tract infections: no   Relieved by:  Nothing Worsened by:  Nothing Ineffective treatments:  None tried Urinary symptoms: frequent urination   Associated symptoms: no nausea and no vomiting   Risk factors: not pregnant       Past Medical History:  Diagnosis Date   Anxiety    Cancer ([facility]) 03/2021   left breast IDC    Patient Active Problem List   Diagnosis Date Noted   Port-A-Cath in place 06/18/2021   Malignant neoplasm of upper-outer quadrant of left breast in female, estrogen receptor positive ([facility]) 05/12/2021   Breast pain, left 04/24/2021   Elevated blood pressure reading 04/24/2021   Perimenopause 04/24/2021    Past Surgical History:  Procedure Laterality Date   PORTACATH PLACEMENT N/A 05/22/2021   Procedure: INSERTION PORT-A-CATH;  Surgeon: Jovita Kussmaul, MD;  Location: [facility];  Service: General;  Laterality: N/A;   WISDOM TOOTH EXTRACTION       OB History   No obstetric history on file.     History reviewed. No pertinent family history.  Social History   Tobacco Use   Smoking status: Former   Smokeless tobacco: Never  Substance Use Topics   Alcohol use: Yes    Comment: social   Drug use: Never    Home Medications Prior to Admission  medications   Medication Sig Start Date End Date Taking? Authorizing Provider  lidocaine-prilocaine (EMLA) cream Apply to affected area once 05/13/21   Nicholas Lose, MD  LORazepam (ATIVAN) 0.5 MG tablet Take 1 tablet (0.5 mg total) by mouth at bedtime as needed for sleep. 05/13/21   Nicholas Lose, MD  ondansetron (ZOFRAN) 8 MG tablet Take 1 tablet (8 mg total) by mouth 2 (two) times daily as needed. Start on the third day after carboplatin and AC chemotherapy. 05/13/21   Nicholas Lose, MD  prochlorperazine (COMPAZINE) 10 MG tablet Take 1 tablet (10 mg total) by mouth every 6 (six) hours as needed (Nausea or vomiting). 05/13/21   Nicholas Lose, MD  traZODone (DESYREL) 50 MG tablet Take 1 tablet (50 mg total) by mouth at bedtime. 05/12/21   Rehman, Areeg N, DO    Allergies    Patient has no known allergies.  Review of Systems   Review of Systems  Gastrointestinal:  Negative for nausea and vomiting.  Genitourinary:  Positive for dysuria.  All other systems reviewed and are negative.  Physical Exam Updated Vital Signs BP (!) 165/104 (BP Location: Left Arm)   Pulse (!) 130   Temp 97.7 F (36.5 C) (Oral)   Resp 20   SpO2 100%   Physical Exam Constitutional:      Appearance: Normal appearance.  Cardiovascular:     Rate and Rhythm: Tachycardia present.  Pulmonary:     Effort: Pulmonary effort is normal.  Abdominal:     General: Abdomen is flat.     Palpations: Abdomen is soft.  Musculoskeletal:        General: Normal range of motion.     Cervical back: Normal range of motion.  Skin:    General: Skin is warm.  Neurological:     General: No focal deficit present.     Mental Status: She is alert.  Psychiatric:        Mood and Affect: Mood normal.    ED Results / Procedures / Treatments   Labs (all labs ordered are listed, but only abnormal results are displayed) Labs Reviewed  URINALYSIS, ROUTINE W REFLEX MICROSCOPIC - Abnormal; Notable for the following components:      Result  Value   Color, Urine YELLOW (*)    APPearance CLEAR (*)    Specific Gravity, Urine <1.005 (*)    Hgb urine dipstick MODERATE (*)    Bacteria, UA RARE (*)    All other components within normal limits  URINE CULTURE    EKG None  Radiology No results found.  Procedures Procedures   Medications Ordered in ED Medications - No data to display  ED Course  I have reviewed the triage vital signs and the nursing notes.  Pertinent labs & imaging results that were available during my care of the patient were reviewed by me and considered in my medical decision making (see chart for details).    MDM Rules/Calculators/A&P                           MDM:  Ua shows rare bacteria and moderate blood.  Urine dilute with spec gravity of 1.005. Pt's symptoms  consistent with uti.  I discussed with Dr. Tomi Bamberger. I will treat with keflex  Final Clinical Impression(s) / ED Diagnoses Final diagnoses:  Dysuria    Rx / DC Orders ED Discharge Orders          Ordered    cephALEXin (KEFLEX) 500 MG capsule  4 times daily        08/27/21 1938          An After Visit Summary was printed and given to the patient.    Fransico Meadow, [place] 08/27/21 1942    Dorie Rank, MD 08/28/21 [phone]

## 2021-08-27 NOTE — Patient Instructions (Signed)
[facility]  Discharge Instructions: Thank you for choosing [facility] to provide your oncology and hematology care.   If you have a lab appointment with the [facility], please go directly to the [facility] and check in at the registration area.   Wear comfortable clothing and clothing appropriate for easy access to any Portacath or PICC line.   We strive to give you quality time with your provider. You may need to reschedule your appointment if you arrive late (15 or more minutes).  Arriving late affects you and other patients whose appointments are after yours.  Also, if you miss three or more appointments without notifying the office, you may be dismissed from the clinic at the provider's discretion.      For prescription refill requests, have your pharmacy contact our office and allow 72 hours for refills to be completed.    Today you received the following chemotherapy and/or immunotherapy agents: Taxol     To help prevent nausea and vomiting after your treatment, we encourage you to take your nausea medication as directed.  BELOW ARE SYMPTOMS THAT SHOULD BE REPORTED IMMEDIATELY: *FEVER GREATER THAN 100.4 F (38 C) OR HIGHER *CHILLS OR SWEATING *NAUSEA AND VOMITING THAT IS NOT CONTROLLED WITH YOUR NAUSEA MEDICATION *UNUSUAL SHORTNESS OF BREATH *UNUSUAL BRUISING OR BLEEDING *URINARY PROBLEMS (pain or burning when urinating, or frequent urination) *BOWEL PROBLEMS (unusual diarrhea, constipation, pain near the anus) TENDERNESS IN MOUTH AND THROAT WITH OR WITHOUT PRESENCE OF ULCERS (sore throat, sores in mouth, or a toothache) UNUSUAL RASH, SWELLING OR PAIN  UNUSUAL VAGINAL DISCHARGE OR ITCHING   Items with * indicate a potential emergency and should be followed up as soon as possible or go to the Emergency Department if any problems should occur.  Please show the CHEMOTHERAPY ALERT CARD or IMMUNOTHERAPY ALERT CARD at check-in to the  Emergency Department and triage nurse.  Should you have questions after your visit or need to cancel or reschedule your appointment, please contact [facility]  Dept: [phone]  and follow the prompts.  Office hours are 8:00 a.m. to 4:30 p.m. Monday - Friday. Please note that voicemails left after 4:00 p.m. may not be returned until the following business day.  We are closed weekends and major holidays. You have access to a nurse at all times for urgent questions. Please call the main number to the clinic Dept: [phone] and follow the prompts.   For any non-urgent questions, you may also contact your provider using MyChart. We now offer e-Visits for anyone 69 and older to request care online for non-urgent symptoms. For details visit [email or web address].   Also download the MyChart app! Go to the app store, search "MyChart", open the app, select [facility], and log in with your MyChart username and password.  Due to Covid, a mask is required upon entering the hospital/clinic. If you do not have a mask, one will be given to you upon arrival. For doctor visits, patients may have 1 support person aged 51 or older with them. For treatment visits, patients cannot have anyone with them due to current Covid guidelines and our immunocompromised population.

## 2021-08-27 NOTE — Assessment & Plan Note (Signed)
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%, HER2 negative Right breast: 0.4 cm mass at 9:30 position: Benign on biopsy  Treatment plan: 1.Neoadjuvant chemotherapy with Adriamycin and Cytoxan along with pembrolizumab followed by Taxol carboplatin and pembrolizumab (pembrolizumab maintenance for 1 year) 2.mastectomy withaxillary lymph nodedissection 3.Adjuvant radiation 4.Followed by adjuvant antiestrogen therapy (since she is 10% ER positive) ------------------------------------------------------------------------------------------------------------------------------------------------------ Current treatment: Cycle 1 day8Adriamycin and Cytoxan with pembrolizumab(this will start tomorrow) 05/20/2021 echocardiogram EF 60 to 65% 05/20/2021: CT CAP: Large left breast mass with deep involvement of left pectoralis muscle and overlying skin thickening, mild left axillary adenopathy. No metastatic disease elsewhere 05/21/2021: Bone scan: No bone metastases 05/28/2021: Breast MRI: Large mixed cystic and solid enhancing mass involving outer left breast 10 x 7.3 x 8.5 cm posterior involving pectoralis muscle extends to the nipple mass and enhancement together 15 cm. Left axillary lymphadenopathy. ------------------------------------------------------------------------------------------------------- Current Treatment; completed 4 cycles ofAdriamycin, Cytoxan and Pembrolizumab, Today is cycle 2 [name] Keytruda every 3 weeks)  Chemo Toxicities: 1. Fatigue that lasted for 1 week after each chemo 2. Chemotherapy-induced anemia: Hemoglobin today is 11.5, monitoring 3. Taste appears to go away for a week after treatment.   Monitoring closely for toxicities.  Return to clinic weekly for Taxol and in 2 weeks for follow-up with me

## 2021-08-27 NOTE — Discharge Instructions (Addendum)
Return if any problems.

## 2021-08-28 LAB — T4: T4, Total: 10 ug/dL (ref 4.5–12.0)

## 2021-08-29 LAB — URINE CULTURE: Culture: <10000 — AB

## 2021-09-01 ENCOUNTER — Ambulatory Visit: Payer: 59

## 2021-09-02 MED FILL — Dexamethasone Sodium Phosphate Inj 100 MG/10ML: INTRAMUSCULAR | Qty: 1 | Status: AC

## 2021-09-03 ENCOUNTER — Inpatient Hospital Stay: Payer: 59

## 2021-09-03 ENCOUNTER — Other Ambulatory Visit: Payer: Self-pay

## 2021-09-03 ENCOUNTER — Other Ambulatory Visit: Payer: Self-pay | Admitting: Hematology and Oncology

## 2021-09-03 VITALS — BP 139/93 | HR 83 | Temp 98.0°F | Resp 18

## 2021-09-03 DIAGNOSIS — Z79899 Other long term (current) drug therapy: Secondary | ICD-10-CM | POA: Diagnosis not present

## 2021-09-03 DIAGNOSIS — D6481 Anemia due to antineoplastic chemotherapy: Secondary | ICD-10-CM | POA: Diagnosis not present

## 2021-09-03 DIAGNOSIS — D701 Agranulocytosis secondary to cancer chemotherapy: Secondary | ICD-10-CM | POA: Insufficient documentation

## 2021-09-03 DIAGNOSIS — T451X5A Adverse effect of antineoplastic and immunosuppressive drugs, initial encounter: Secondary | ICD-10-CM | POA: Insufficient documentation

## 2021-09-03 DIAGNOSIS — Z5111 Encounter for antineoplastic chemotherapy: Secondary | ICD-10-CM | POA: Insufficient documentation

## 2021-09-03 DIAGNOSIS — C50412 Malignant neoplasm of upper-outer quadrant of left female breast: Secondary | ICD-10-CM | POA: Insufficient documentation

## 2021-09-03 DIAGNOSIS — Z170 Estrogen receptor positive status [ER+]: Secondary | ICD-10-CM

## 2021-09-03 DIAGNOSIS — Z5112 Encounter for antineoplastic immunotherapy: Secondary | ICD-10-CM | POA: Insufficient documentation

## 2021-09-03 DIAGNOSIS — Z5189 Encounter for other specified aftercare: Secondary | ICD-10-CM | POA: Diagnosis not present

## 2021-09-03 DIAGNOSIS — Z95828 Presence of other vascular implants and grafts: Secondary | ICD-10-CM

## 2021-09-03 LAB — CBC WITH DIFFERENTIAL (CANCER CENTER ONLY)
Abs Immature Granulocytes: 0 10*3/uL (ref 0.00–0.07)
Basophils Absolute: 0.1 10*3/uL (ref 0.0–0.1)
Basophils Relative: 2 %
Eosinophils Absolute: 0.2 10*3/uL (ref 0.0–0.5)
Eosinophils Relative: 8 %
HCT: 25.8 % — ABNORMAL LOW (ref 36.0–46.0)
Hemoglobin: 8.8 g/dL — ABNORMAL LOW (ref 12.0–15.0)
Immature Granulocytes: 0 %
Lymphocytes Relative: 26 %
Lymphs Abs: 0.6 10*3/uL — ABNORMAL LOW (ref 0.7–4.0)
MCH: 29.8 pg (ref 26.0–34.0)
MCHC: 34.1 g/dL (ref 30.0–36.0)
MCV: 87.5 fL (ref 80.0–100.0)
Monocytes Absolute: 0.3 10*3/uL (ref 0.1–1.0)
Monocytes Relative: 15 %
Neutro Abs: 1.1 10*3/uL — ABNORMAL LOW (ref 1.7–7.7)
Neutrophils Relative %: 49 %
Platelet Count: 112 10*3/uL — ABNORMAL LOW (ref 150–400)
RBC: 2.95 MIL/uL — ABNORMAL LOW (ref 3.87–5.11)
RDW: 15.6 % — ABNORMAL HIGH (ref 11.5–15.5)
WBC Count: 2.2 10*3/uL — ABNORMAL LOW (ref 4.0–10.5)
nRBC: 0 % (ref 0.0–0.2)

## 2021-09-03 LAB — CMP (CANCER CENTER ONLY)
ALT: 40 U/L (ref 0–44)
AST: 31 U/L (ref 15–41)
Albumin: 3.8 g/dL (ref 3.5–5.0)
Alkaline Phosphatase: 66 U/L (ref 38–126)
Anion gap: 10 (ref 5–15)
BUN: 11 mg/dL (ref 6–20)
CO2: 25 mmol/L (ref 22–32)
Calcium: 9.4 mg/dL (ref 8.9–10.3)
Chloride: 107 mmol/L (ref 98–111)
Creatinine: 0.75 mg/dL (ref 0.44–1.00)
GFR, Estimated: >60 mL/min (ref >60)
Glucose, Bld: 94 mg/dL (ref 70–99)
Potassium: 4.2 mmol/L (ref 3.5–5.1)
Sodium: 142 mmol/L (ref 135–145)
Total Bilirubin: 0.3 mg/dL (ref 0.3–1.2)
Total Protein: 6.6 g/dL (ref 6.5–8.1)

## 2021-09-03 LAB — TSH: TSH: 1.161 u[IU]/mL (ref 0.308–3.960)

## 2021-09-03 MED ORDER — FAMOTIDINE 20 MG IN NS 100 ML IVPB
20.0000 mg | Freq: Once | INTRAVENOUS | Status: AC
  Administered 2021-09-03: 20 mg via INTRAVENOUS
  Filled 2021-09-03: qty 100

## 2021-09-03 MED ORDER — SODIUM CHLORIDE 0.9 % IV SOLN
10.0000 mg | Freq: Once | INTRAVENOUS | Status: AC
  Administered 2021-09-03: 10 mg via INTRAVENOUS
  Filled 2021-09-03: qty 10

## 2021-09-03 MED ORDER — SODIUM CHLORIDE 0.9% FLUSH
10.0000 mL | Freq: Once | INTRAVENOUS | Status: AC
  Administered 2021-09-03: 10 mL

## 2021-09-03 MED ORDER — SODIUM CHLORIDE 0.9% FLUSH
10.0000 mL | INTRAVENOUS | Status: DC | PRN
  Administered 2021-09-03: 10 mL

## 2021-09-03 MED ORDER — SODIUM CHLORIDE 0.9 % IV SOLN
60.0000 mg/m2 | Freq: Once | INTRAVENOUS | Status: AC
  Administered 2021-09-03: 102 mg via INTRAVENOUS
  Filled 2021-09-03: qty 17

## 2021-09-03 MED ORDER — HEPARIN SOD (PORK) LOCK FLUSH 100 UNIT/ML IV SOLN
500.0000 [IU] | Freq: Once | INTRAVENOUS | Status: AC | PRN
  Administered 2021-09-03: 500 [IU]

## 2021-09-03 MED ORDER — SODIUM CHLORIDE 0.9 % IV SOLN: Freq: Once | INTRAVENOUS | Status: AC

## 2021-09-03 MED ORDER — DIPHENHYDRAMINE HCL 50 MG/ML IJ SOLN
50.0000 mg | Freq: Once | INTRAMUSCULAR | Status: AC
  Administered 2021-09-03: 50 mg via INTRAVENOUS
  Filled 2021-09-03: qty 1

## 2021-09-03 NOTE — Patient Instructions (Signed)
[facility]   Discharge Instructions: Thank you for choosing [facility] to provide your oncology and hematology care.   If you have a lab appointment with the Cancer Center, please go directly to the Cancer Center and check in at the registration area.   Wear comfortable clothing and clothing appropriate for easy access to any Portacath or PICC line.   We strive to give you quality time with your provider. You may need to reschedule your appointment if you arrive late (15 or more minutes).  Arriving late affects you and other patients whose appointments are after yours.  Also, if you miss three or more appointments without notifying the office, you may be dismissed from the clinic at the provider's discretion.      For prescription refill requests, have your pharmacy contact our office and allow 72 hours for refills to be completed.    Today you received the following chemotherapy and/or immunotherapy agents: paclitaxel.      To help prevent nausea and vomiting after your treatment, we encourage you to take your nausea medication as directed.  BELOW ARE SYMPTOMS THAT SHOULD BE REPORTED IMMEDIATELY: *FEVER GREATER THAN 100.4 F (38 C) OR HIGHER *CHILLS OR SWEATING *NAUSEA AND VOMITING THAT IS NOT CONTROLLED WITH YOUR NAUSEA MEDICATION *UNUSUAL SHORTNESS OF BREATH *UNUSUAL BRUISING OR BLEEDING *URINARY PROBLEMS (pain or burning when urinating, or frequent urination) *BOWEL PROBLEMS (unusual diarrhea, constipation, pain near the anus) TENDERNESS IN MOUTH AND THROAT WITH OR WITHOUT PRESENCE OF ULCERS (sore throat, sores in mouth, or a toothache) UNUSUAL RASH, SWELLING OR PAIN  UNUSUAL VAGINAL DISCHARGE OR ITCHING   Items with * indicate a potential emergency and should be followed up as soon as possible or go to the Emergency Department if any problems should occur.  Please show the CHEMOTHERAPY ALERT CARD or IMMUNOTHERAPY ALERT CARD at check-in  to the Emergency Department and triage nurse.  Should you have questions after your visit or need to cancel or reschedule your appointment, please contact [facility]  Dept: [phone]  and follow the prompts.  Office hours are 8:00 a.m. to 4:30 p.m. Monday - Friday. Please note that voicemails left after 4:00 p.m. may not be returned until the following business day.  We are closed weekends and major holidays. You have access to a nurse at all times for urgent questions. Please call the main number to the clinic Dept: [phone] and follow the prompts.   For any non-urgent questions, you may also contact your provider using MyChart. We now offer e-Visits for anyone 18 and older to request care online for non-urgent symptoms. For details visit [email or web address].   Also download the MyChart app! Go to the app store, search "MyChart", open the app, select [facility], and log in with your MyChart username and password.  Due to Covid, a mask is required upon entering the hospital/clinic. If you do not have a mask, one will be given to you upon arrival. For doctor visits, patients may have 1 support person aged 18 or older with them. For treatment visits, patients cannot have anyone with them due to current Covid guidelines and our immunocompromised population.   

## 2021-09-03 NOTE — Progress Notes (Signed)
Per Dr. Lindi Adie, ok to treat with ANC 1.1. Will dose-reduce paclitaxel.

## 2021-09-04 LAB — T4: T4, Total: 10.5 ug/dL (ref 4.5–12.0)

## 2021-09-05 ENCOUNTER — Other Ambulatory Visit: Payer: Self-pay

## 2021-09-05 ENCOUNTER — Inpatient Hospital Stay: Payer: 59

## 2021-09-05 VITALS — BP 143/90 | HR 93 | Temp 97.7°F | Resp 18

## 2021-09-05 DIAGNOSIS — Z170 Estrogen receptor positive status [ER+]: Secondary | ICD-10-CM

## 2021-09-05 DIAGNOSIS — Z5112 Encounter for antineoplastic immunotherapy: Secondary | ICD-10-CM | POA: Diagnosis not present

## 2021-09-05 DIAGNOSIS — C50412 Malignant neoplasm of upper-outer quadrant of left female breast: Secondary | ICD-10-CM

## 2021-09-05 MED ORDER — FILGRASTIM-AAFI 300 MCG/0.5ML IJ SOSY
300.0000 ug | PREFILLED_SYRINGE | Freq: Once | INTRAMUSCULAR | Status: AC
  Administered 2021-09-05: 300 ug via SUBCUTANEOUS
  Filled 2021-09-05: qty 0.5

## 2021-09-07 ENCOUNTER — Other Ambulatory Visit: Payer: Self-pay

## 2021-09-07 ENCOUNTER — Inpatient Hospital Stay: Payer: 59

## 2021-09-07 DIAGNOSIS — Z5112 Encounter for antineoplastic immunotherapy: Secondary | ICD-10-CM | POA: Diagnosis not present

## 2021-09-07 DIAGNOSIS — C50412 Malignant neoplasm of upper-outer quadrant of left female breast: Secondary | ICD-10-CM

## 2021-09-07 MED ORDER — FILGRASTIM-AAFI 300 MCG/0.5ML IJ SOSY
300.0000 ug | PREFILLED_SYRINGE | Freq: Once | INTRAMUSCULAR | Status: AC
  Administered 2021-09-07: 300 ug via SUBCUTANEOUS
  Filled 2021-09-07: qty 0.5

## 2021-09-08 ENCOUNTER — Inpatient Hospital Stay: Payer: 59

## 2021-09-08 VITALS — BP 134/81 | HR 79 | Temp 98.7°F | Resp 16

## 2021-09-08 DIAGNOSIS — Z5112 Encounter for antineoplastic immunotherapy: Secondary | ICD-10-CM | POA: Diagnosis not present

## 2021-09-08 DIAGNOSIS — C50412 Malignant neoplasm of upper-outer quadrant of left female breast: Secondary | ICD-10-CM

## 2021-09-08 DIAGNOSIS — Z170 Estrogen receptor positive status [ER+]: Secondary | ICD-10-CM

## 2021-09-08 MED ORDER — FILGRASTIM-AAFI 300 MCG/0.5ML IJ SOSY
300.0000 ug | PREFILLED_SYRINGE | Freq: Once | INTRAMUSCULAR | Status: AC
  Administered 2021-09-08: 300 ug via SUBCUTANEOUS
  Filled 2021-09-08: qty 0.5

## 2021-09-09 ENCOUNTER — Inpatient Hospital Stay: Payer: 59

## 2021-09-10 ENCOUNTER — Other Ambulatory Visit: Payer: Self-pay

## 2021-09-10 ENCOUNTER — Inpatient Hospital Stay: Payer: 59

## 2021-09-10 VITALS — BP 144/79 | HR 100 | Temp 98.0°F | Resp 16 | Wt 160.5 lb

## 2021-09-10 DIAGNOSIS — Z170 Estrogen receptor positive status [ER+]: Secondary | ICD-10-CM

## 2021-09-10 DIAGNOSIS — Z95828 Presence of other vascular implants and grafts: Secondary | ICD-10-CM

## 2021-09-10 DIAGNOSIS — Z5112 Encounter for antineoplastic immunotherapy: Secondary | ICD-10-CM | POA: Diagnosis not present

## 2021-09-10 DIAGNOSIS — C50412 Malignant neoplasm of upper-outer quadrant of left female breast: Secondary | ICD-10-CM

## 2021-09-10 LAB — CBC WITH DIFFERENTIAL/PLATELET
Abs Immature Granulocytes: 0.04 10*3/uL (ref 0.00–0.07)
Basophils Absolute: 0 10*3/uL (ref 0.0–0.1)
Basophils Relative: 1 %
Eosinophils Absolute: 0.2 10*3/uL (ref 0.0–0.5)
Eosinophils Relative: 5 %
HCT: 27.3 % — ABNORMAL LOW (ref 36.0–46.0)
Hemoglobin: 9.3 g/dL — ABNORMAL LOW (ref 12.0–15.0)
Immature Granulocytes: 1 %
Lymphocytes Relative: 17 %
Lymphs Abs: 0.8 10*3/uL (ref 0.7–4.0)
MCH: 30.9 pg (ref 26.0–34.0)
MCHC: 34.1 g/dL (ref 30.0–36.0)
MCV: 90.7 fL (ref 80.0–100.0)
Monocytes Absolute: 0.6 10*3/uL (ref 0.1–1.0)
Monocytes Relative: 13 %
Neutro Abs: 2.8 10*3/uL (ref 1.7–7.7)
Neutrophils Relative %: 63 %
Platelets: 274 10*3/uL (ref 150–400)
RBC: 3.01 MIL/uL — ABNORMAL LOW (ref 3.87–5.11)
RDW: 16.6 % — ABNORMAL HIGH (ref 11.5–15.5)
WBC: 4.4 10*3/uL (ref 4.0–10.5)
nRBC: 0 % (ref 0.0–0.2)

## 2021-09-10 LAB — CMP (CANCER CENTER ONLY)
ALT: 31 U/L (ref 0–44)
AST: 25 U/L (ref 15–41)
Albumin: 4 g/dL (ref 3.5–5.0)
Alkaline Phosphatase: 104 U/L (ref 38–126)
Anion gap: 7 (ref 5–15)
BUN: 14 mg/dL (ref 6–20)
CO2: 27 mmol/L (ref 22–32)
Calcium: 9.5 mg/dL (ref 8.9–10.3)
Chloride: 107 mmol/L (ref 98–111)
Creatinine: 0.78 mg/dL (ref 0.44–1.00)
GFR, Estimated: >60 mL/min (ref >60)
Glucose, Bld: 99 mg/dL (ref 70–99)
Potassium: 3.9 mmol/L (ref 3.5–5.1)
Sodium: 141 mmol/L (ref 135–145)
Total Bilirubin: 0.3 mg/dL (ref 0.3–1.2)
Total Protein: 6.9 g/dL (ref 6.5–8.1)

## 2021-09-10 LAB — CBC WITH DIFFERENTIAL (CANCER CENTER ONLY)
Abs Immature Granulocytes: 0.04 10*3/uL (ref 0.00–0.07)
Basophils Absolute: 0 10*3/uL (ref 0.0–0.1)
Basophils Relative: 1 %
Eosinophils Absolute: 0.2 10*3/uL (ref 0.0–0.5)
Eosinophils Relative: 4 %
HCT: 27.1 % — ABNORMAL LOW (ref 36.0–46.0)
Hemoglobin: 9.2 g/dL — ABNORMAL LOW (ref 12.0–15.0)
Immature Granulocytes: 1 %
Lymphocytes Relative: 16 %
Lymphs Abs: 0.7 10*3/uL (ref 0.7–4.0)
MCH: 30.7 pg (ref 26.0–34.0)
MCHC: 33.9 g/dL (ref 30.0–36.0)
MCV: 90.3 fL (ref 80.0–100.0)
Monocytes Absolute: 0.6 10*3/uL (ref 0.1–1.0)
Monocytes Relative: 13 %
Neutro Abs: 2.9 10*3/uL (ref 1.7–7.7)
Neutrophils Relative %: 65 %
Platelet Count: 256 10*3/uL (ref 150–400)
RBC: 3 MIL/uL — ABNORMAL LOW (ref 3.87–5.11)
RDW: 16.6 % — ABNORMAL HIGH (ref 11.5–15.5)
WBC Count: 4.4 10*3/uL (ref 4.0–10.5)
nRBC: 0 % (ref 0.0–0.2)

## 2021-09-10 LAB — TSH: TSH: 1.866 u[IU]/mL (ref 0.308–3.960)

## 2021-09-10 MED ORDER — SODIUM CHLORIDE 0.9 % IV SOLN
10.0000 mg | Freq: Once | INTRAVENOUS | Status: AC
  Administered 2021-09-10: 10 mg via INTRAVENOUS
  Filled 2021-09-10: qty 10

## 2021-09-10 MED ORDER — SODIUM CHLORIDE 0.9% FLUSH: 10.0000 mL | Freq: Once | INTRAVENOUS | Status: DC

## 2021-09-10 MED ORDER — SODIUM CHLORIDE 0.9 % IV SOLN
600.0000 mg | Freq: Once | INTRAVENOUS | Status: AC
  Administered 2021-09-10: 600 mg via INTRAVENOUS
  Filled 2021-09-10: qty 60

## 2021-09-10 MED ORDER — PALONOSETRON HCL INJECTION 0.25 MG/5ML
0.2500 mg | Freq: Once | INTRAVENOUS | Status: AC
  Administered 2021-09-10: 0.25 mg via INTRAVENOUS
  Filled 2021-09-10: qty 5

## 2021-09-10 MED ORDER — SODIUM CHLORIDE 0.9% FLUSH
10.0000 mL | INTRAVENOUS | Status: DC | PRN
  Administered 2021-09-10: 10 mL

## 2021-09-10 MED ORDER — SODIUM CHLORIDE 0.9 % IV SOLN
200.0000 mg | Freq: Once | INTRAVENOUS | Status: AC
  Administered 2021-09-10: 200 mg via INTRAVENOUS
  Filled 2021-09-10: qty 8

## 2021-09-10 MED ORDER — DIPHENHYDRAMINE HCL 50 MG/ML IJ SOLN
50.0000 mg | Freq: Once | INTRAMUSCULAR | Status: AC
  Administered 2021-09-10: 50 mg via INTRAVENOUS
  Filled 2021-09-10: qty 1

## 2021-09-10 MED ORDER — SODIUM CHLORIDE 0.9 % IV SOLN
150.0000 mg | Freq: Once | INTRAVENOUS | Status: AC
  Administered 2021-09-10: 150 mg via INTRAVENOUS
  Filled 2021-09-10: qty 150

## 2021-09-10 MED ORDER — FAMOTIDINE 20 MG IN NS 100 ML IVPB
20.0000 mg | Freq: Once | INTRAVENOUS | Status: AC
  Administered 2021-09-10: 20 mg via INTRAVENOUS
  Filled 2021-09-10: qty 100

## 2021-09-10 MED ORDER — HEPARIN SOD (PORK) LOCK FLUSH 100 UNIT/ML IV SOLN
500.0000 [IU] | Freq: Once | INTRAVENOUS | Status: AC | PRN
  Administered 2021-09-10: 500 [IU]

## 2021-09-10 MED ORDER — SODIUM CHLORIDE 0.9 % IV SOLN: Freq: Once | INTRAVENOUS | Status: AC

## 2021-09-10 MED ORDER — SODIUM CHLORIDE 0.9 % IV SOLN
60.0000 mg/m2 | Freq: Once | INTRAVENOUS | Status: AC
  Administered 2021-09-10: 102 mg via INTRAVENOUS
  Filled 2021-09-10: qty 17

## 2021-09-10 NOTE — Patient Instructions (Signed)
[facility]  Discharge Instructions: Thank you for choosing [facility] to provide your oncology and hematology care.   If you have a lab appointment with the Cancer Center, please go directly to the Cancer Center and check in at the registration area.   Wear comfortable clothing and clothing appropriate for easy access to any Portacath or PICC line.   We strive to give you quality time with your provider. You may need to reschedule your appointment if you arrive late (15 or more minutes).  Arriving late affects you and other patients whose appointments are after yours.  Also, if you miss three or more appointments without notifying the office, you may be dismissed from the clinic at the provider's discretion.      For prescription refill requests, have your pharmacy contact our office and allow 72 hours for refills to be completed.    Today you received the following chemotherapy and/or immunotherapy agents: Keytruda, Taxol, Carboplatin     To help prevent nausea and vomiting after your treatment, we encourage you to take your nausea medication as directed.  BELOW ARE SYMPTOMS THAT SHOULD BE REPORTED IMMEDIATELY: . *FEVER GREATER THAN 100.4 F (38 C) OR HIGHER . *CHILLS OR SWEATING . *NAUSEA AND VOMITING THAT IS NOT CONTROLLED WITH YOUR NAUSEA MEDICATION . *UNUSUAL SHORTNESS OF BREATH . *UNUSUAL BRUISING OR BLEEDING . *URINARY PROBLEMS (pain or burning when urinating, or frequent urination) . *BOWEL PROBLEMS (unusual diarrhea, constipation, pain near the anus) . TENDERNESS IN MOUTH AND THROAT WITH OR WITHOUT PRESENCE OF ULCERS (sore throat, sores in mouth, or a toothache) . UNUSUAL RASH, SWELLING OR PAIN  . UNUSUAL VAGINAL DISCHARGE OR ITCHING   Items with * indicate a potential emergency and should be followed up as soon as possible or go to the Emergency Department if any problems should occur.  Please show the CHEMOTHERAPY ALERT CARD or  IMMUNOTHERAPY ALERT CARD at check-in to the Emergency Department and triage nurse.  Should you have questions after your visit or need to cancel or reschedule your appointment, please contact [facility]  Dept: [phone]  and follow the prompts.  Office hours are 8:00 a.m. to 4:30 p.m. Monday - Friday. Please note that voicemails left after 4:00 p.m. may not be returned until the following business day.  We are closed weekends and major holidays. You have access to a nurse at all times for urgent questions. Please call the main number to the clinic Dept: [phone] and follow the prompts.   For any non-urgent questions, you may also contact your provider using MyChart. We now offer e-Visits for anyone 18 and older to request care online for non-urgent symptoms. For details visit [email or web address].   Also download the MyChart app! Go to the app store, search "MyChart", open the app, select [facility], and log in with your MyChart username and password.  Due to Covid, a mask is required upon entering the hospital/clinic. If you do not have a mask, one will be given to you upon arrival. For doctor visits, patients may have 1 support person aged 18 or older with them. For treatment visits, patients cannot have anyone with them due to current Covid guidelines and our immunocompromised population.   

## 2021-09-11 LAB — T4: T4, Total: 11.9 ug/dL (ref 4.5–12.0)

## 2021-09-14 ENCOUNTER — Telehealth: Payer: Self-pay

## 2021-09-14 NOTE — Telephone Encounter (Signed)
Pt called scheduling to inquire about whether or not she would need injection 9/20. This nurse attempted to return call to pt. No answer, LVM for return call.

## 2021-09-14 NOTE — Telephone Encounter (Signed)
Pt returned call and understands she should have injection 9/20 prior to tx 9/22.

## 2021-09-15 ENCOUNTER — Other Ambulatory Visit: Payer: Self-pay | Admitting: Hematology and Oncology

## 2021-09-15 ENCOUNTER — Inpatient Hospital Stay: Payer: 59

## 2021-09-15 ENCOUNTER — Other Ambulatory Visit: Payer: Self-pay

## 2021-09-15 VITALS — BP 122/86 | HR 97 | Temp 98.3°F | Resp 18

## 2021-09-15 DIAGNOSIS — Z5112 Encounter for antineoplastic immunotherapy: Secondary | ICD-10-CM | POA: Diagnosis not present

## 2021-09-15 DIAGNOSIS — Z95828 Presence of other vascular implants and grafts: Secondary | ICD-10-CM

## 2021-09-15 MED ORDER — FILGRASTIM-AAFI 480 MCG/0.8ML IJ SOSY
480.0000 ug | PREFILLED_SYRINGE | Freq: Once | INTRAMUSCULAR | Status: AC
  Administered 2021-09-15: 480 ug via SUBCUTANEOUS
  Filled 2021-09-15: qty 0.8

## 2021-09-15 NOTE — Patient Instructions (Signed)
Filgrastim, G-CSF injection What is this medication? FILGRASTIM, G-CSF (fil GRA stim) is a granulocyte colony-stimulating factor that stimulates the growth of neutrophils, a type of white blood cell (WBC) important in the body's fight against infection. It is used to reduce the incidence of fever and infection in patients with certain types of cancer who are receiving chemotherapy that affects the bone marrow, to stimulate blood cell production for removal of WBCs from the body prior to a bone marrow transplantation, to reduce the incidence of fever and infection in patients who have severe chronic neutropenia, and to improve survival outcomes following high-dose radiation exposure that is toxic to the bone marrow. This medicine may be used for other purposes; ask your health care provider or pharmacist if you have questions. COMMON BRAND NAME(S): Neupogen, Nivestym, Releuko, Zarxio What should I tell my care team before I take this medication? They need to know if you have any of these conditions: kidney disease latex allergy ongoing radiation therapy sickle cell disease an unusual or allergic reaction to filgrastim, pegfilgrastim, other medicines, foods, dyes, or preservatives pregnant or trying to get pregnant breast-feeding How should I use this medication? This medicine is for injection under the skin or infusion into a vein. As an infusion into a vein, it is usually given by a health care professional in a hospital or clinic setting. If you get this medicine at home, you will be taught how to prepare and give this medicine. Refer to the Instructions for Use that come with your medication packaging. Use exactly as directed. Take your medicine at regular intervals. Do not take your medicine more often than directed. It is important that you put your used needles and syringes in a special sharps container. Do not put them in a trash can. If you do not have a sharps container, call your pharmacist  or healthcare provider to get one. Talk to your pediatrician regarding the use of this medicine in children. While this drug may be prescribed for children as young as 7 months for selected conditions, precautions do apply. Overdosage: If you think you have taken too much of this medicine contact a poison control center or emergency room at once. NOTE: This medicine is only for you. Do not share this medicine with others. What if I miss a dose? It is important not to miss your dose. Call your doctor or health care professional if you miss a dose. What may interact with this medication? This medicine may interact with the following medications: medicines that may cause a release of neutrophils, such as lithium This list may not describe all possible interactions. Give your health care provider a list of all the medicines, herbs, non-prescription drugs, or dietary supplements you use. Also tell them if you smoke, drink alcohol, or use illegal drugs. Some items may interact with your medicine. What should I watch for while using this medication? Your condition will be monitored carefully while you are receiving this medicine. You may need blood work done while you are taking this medicine. Talk to your health care provider about your risk of cancer. You may be more at risk for certain types of cancer if you take this medicine. What side effects may I notice from receiving this medication? Side effects that you should report to your doctor or health care professional as soon as possible: allergic reactions like skin rash, itching or hives, swelling of the face, lips, or tongue back pain dizziness or feeling faint fever pain, redness, or   irritation at site where injected pinpoint red spots on the skin shortness of breath or breathing problems signs and symptoms of kidney injury like trouble passing urine, change in the amount of urine, or red or dark-brown urine stomach or side pain, or pain at  the shoulder swelling tiredness unusual bleeding or bruising Side effects that usually do not require medical attention (report to your doctor or health care professional if they continue or are bothersome): bone pain cough diarrhea hair loss headache muscle pain This list may not describe all possible side effects. Call your doctor for medical advice about side effects. You may report side effects to FDA at 1-800-FDA-1088. Where should I keep my medication? Keep out of the reach of children. Store in a refrigerator between 2 and 8 degrees C (36 and 46 degrees F). Do not freeze. Keep in carton to protect from light. Throw away this medicine if vials or syringes are left out of the refrigerator for more than 24 hours. Throw away any unused medicine after the expiration date. NOTE: This sheet is a summary. It may not cover all possible information. If you have questions about this medicine, talk to your doctor, pharmacist, or health care provider.  2022 Elsevier/Gold Standard (2020-01-03 18:47:55)  

## 2021-09-17 ENCOUNTER — Other Ambulatory Visit: Payer: Self-pay

## 2021-09-17 ENCOUNTER — Inpatient Hospital Stay: Payer: 59

## 2021-09-17 VITALS — BP 129/77 | HR 84 | Temp 98.2°F | Resp 18 | Ht 64.0 in | Wt 158.8 lb

## 2021-09-17 DIAGNOSIS — Z5112 Encounter for antineoplastic immunotherapy: Secondary | ICD-10-CM | POA: Diagnosis not present

## 2021-09-17 DIAGNOSIS — C50412 Malignant neoplasm of upper-outer quadrant of left female breast: Secondary | ICD-10-CM

## 2021-09-17 DIAGNOSIS — Z170 Estrogen receptor positive status [ER+]: Secondary | ICD-10-CM

## 2021-09-17 DIAGNOSIS — Z95828 Presence of other vascular implants and grafts: Secondary | ICD-10-CM

## 2021-09-17 LAB — CBC WITH DIFFERENTIAL (CANCER CENTER ONLY)
Abs Immature Granulocytes: 0.05 10*3/uL (ref 0.00–0.07)
Basophils Absolute: 0 10*3/uL (ref 0.0–0.1)
Basophils Relative: 1 %
Eosinophils Absolute: 0 10*3/uL (ref 0.0–0.5)
Eosinophils Relative: 2 %
HCT: 25.2 % — ABNORMAL LOW (ref 36.0–46.0)
Hemoglobin: 8.8 g/dL — ABNORMAL LOW (ref 12.0–15.0)
Immature Granulocytes: 2 %
Lymphocytes Relative: 21 %
Lymphs Abs: 0.6 10*3/uL — ABNORMAL LOW (ref 0.7–4.0)
MCH: 31.8 pg (ref 26.0–34.0)
MCHC: 34.9 g/dL (ref 30.0–36.0)
MCV: 91 fL (ref 80.0–100.0)
Monocytes Absolute: 0.2 10*3/uL (ref 0.1–1.0)
Monocytes Relative: 9 %
Neutro Abs: 1.7 10*3/uL (ref 1.7–7.7)
Neutrophils Relative %: 65 %
Platelet Count: 188 10*3/uL (ref 150–400)
RBC: 2.77 MIL/uL — ABNORMAL LOW (ref 3.87–5.11)
RDW: 14.9 % (ref 11.5–15.5)
WBC Count: 2.6 10*3/uL — ABNORMAL LOW (ref 4.0–10.5)
nRBC: 0 % (ref 0.0–0.2)

## 2021-09-17 LAB — TSH: TSH: 2.348 u[IU]/mL (ref 0.308–3.960)

## 2021-09-17 LAB — CMP (CANCER CENTER ONLY)
ALT: 21 U/L (ref 0–44)
AST: 20 U/L (ref 15–41)
Albumin: 4 g/dL (ref 3.5–5.0)
Alkaline Phosphatase: 89 U/L (ref 38–126)
Anion gap: 8 (ref 5–15)
BUN: 13 mg/dL (ref 6–20)
CO2: 26 mmol/L (ref 22–32)
Calcium: 9.4 mg/dL (ref 8.9–10.3)
Chloride: 104 mmol/L (ref 98–111)
Creatinine: 0.7 mg/dL (ref 0.44–1.00)
GFR, Estimated: >60 mL/min (ref >60)
Glucose, Bld: 102 mg/dL — ABNORMAL HIGH (ref 70–99)
Potassium: 4.1 mmol/L (ref 3.5–5.1)
Sodium: 138 mmol/L (ref 135–145)
Total Bilirubin: 0.4 mg/dL (ref 0.3–1.2)
Total Protein: 6.9 g/dL (ref 6.5–8.1)

## 2021-09-17 MED ORDER — SODIUM CHLORIDE 0.9 % IV SOLN
60.0000 mg/m2 | Freq: Once | INTRAVENOUS | Status: AC
  Administered 2021-09-17: 102 mg via INTRAVENOUS
  Filled 2021-09-17: qty 17

## 2021-09-17 MED ORDER — SODIUM CHLORIDE 0.9 % IV SOLN: Freq: Once | INTRAVENOUS | Status: AC

## 2021-09-17 MED ORDER — SODIUM CHLORIDE 0.9% FLUSH
10.0000 mL | Freq: Once | INTRAVENOUS | Status: AC
  Administered 2021-09-17: 10 mL

## 2021-09-17 MED ORDER — SODIUM CHLORIDE 0.9 % IV SOLN
10.0000 mg | Freq: Once | INTRAVENOUS | Status: AC
  Administered 2021-09-17: 10 mg via INTRAVENOUS
  Filled 2021-09-17: qty 10

## 2021-09-17 MED ORDER — SODIUM CHLORIDE 0.9% FLUSH
10.0000 mL | INTRAVENOUS | Status: DC | PRN
  Administered 2021-09-17: 10 mL

## 2021-09-17 MED ORDER — HEPARIN SOD (PORK) LOCK FLUSH 100 UNIT/ML IV SOLN
500.0000 [IU] | Freq: Once | INTRAVENOUS | Status: AC | PRN
  Administered 2021-09-17: 500 [IU]

## 2021-09-17 MED ORDER — DIPHENHYDRAMINE HCL 50 MG/ML IJ SOLN
50.0000 mg | Freq: Once | INTRAMUSCULAR | Status: AC
  Administered 2021-09-17: 50 mg via INTRAVENOUS
  Filled 2021-09-17: qty 1

## 2021-09-17 MED ORDER — FAMOTIDINE 20 MG IN NS 100 ML IVPB
INTRAVENOUS | Status: AC
  Administered 2021-09-17: 20 mg via INTRAVENOUS
  Filled 2021-09-17: qty 100

## 2021-09-17 MED ORDER — FAMOTIDINE 20 MG IN NS 100 ML IVPB: 20.0000 mg | Freq: Once | INTRAVENOUS | Status: AC

## 2021-09-17 NOTE — Patient Instructions (Signed)
[facility] ONCOLOGY  Discharge Instructions: Thank you for choosing [facility] to provide your oncology and hematology care.   If you have a lab appointment with the [facility], please go directly to the [facility] and check in at the registration area.   Wear comfortable clothing and clothing appropriate for easy access to any Portacath or PICC line.   We strive to give you quality time with your provider. You may need to reschedule your appointment if you arrive late (15 or more minutes).  Arriving late affects you and other patients whose appointments are after yours.  Also, if you miss three or more appointments without notifying the office, you may be dismissed from the clinic at the provider's discretion.      For prescription refill requests, have your pharmacy contact our office and allow 72 hours for refills to be completed.    Today you received the following chemotherapy and/or immunotherapy agents: Taxol.      To help prevent nausea and vomiting after your treatment, we encourage you to take your nausea medication as directed.  BELOW ARE SYMPTOMS THAT SHOULD BE REPORTED IMMEDIATELY: *FEVER GREATER THAN 100.4 F (38 C) OR HIGHER *CHILLS OR SWEATING *NAUSEA AND VOMITING THAT IS NOT CONTROLLED WITH YOUR NAUSEA MEDICATION *UNUSUAL SHORTNESS OF BREATH *UNUSUAL BRUISING OR BLEEDING *URINARY PROBLEMS (pain or burning when urinating, or frequent urination) *BOWEL PROBLEMS (unusual diarrhea, constipation, pain near the anus) TENDERNESS IN MOUTH AND THROAT WITH OR WITHOUT PRESENCE OF ULCERS (sore throat, sores in mouth, or a toothache) UNUSUAL RASH, SWELLING OR PAIN  UNUSUAL VAGINAL DISCHARGE OR ITCHING   Items with * indicate a potential emergency and should be followed up as soon as possible or go to the Emergency Department if any problems should occur.  Please show the CHEMOTHERAPY ALERT CARD or IMMUNOTHERAPY ALERT CARD at check-in to the  Emergency Department and triage nurse.  Should you have questions after your visit or need to cancel or reschedule your appointment, please contact [facility]  Dept: [phone]  and follow the prompts.  Office hours are 8:00 a.m. to 4:30 p.m. Monday - Friday. Please note that voicemails left after 4:00 p.m. may not be returned until the following business day.  We are closed weekends and major holidays. You have access to a nurse at all times for urgent questions. Please call the main number to the clinic Dept: [phone] and follow the prompts.   For any non-urgent questions, you may also contact your provider using MyChart. We now offer e-Visits for anyone 25 and older to request care online for non-urgent symptoms. For details visit [email or web address].   Also download the MyChart app! Go to the app store, search "MyChart", open the app, select [facility], and log in with your MyChart username and password.  Due to Covid, a mask is required upon entering the hospital/clinic. If you do not have a mask, one will be given to you upon arrival. For doctor visits, patients may have 1 support person aged 65 or older with them. For treatment visits, patients cannot have anyone with them due to current Covid guidelines and our immunocompromised population.

## 2021-09-18 LAB — T4: T4, Total: 10.2 ug/dL (ref 4.5–12.0)

## 2021-09-23 MED FILL — Dexamethasone Sodium Phosphate Inj 100 MG/10ML: INTRAMUSCULAR | Qty: 1 | Status: AC

## 2021-09-23 NOTE — Progress Notes (Signed)
Patient Care Team: Mike Craze, DO as PCP - General (Internal Medicine) Mauro Kaufmann, RN as Oncology Nurse Navigator Rockwell Germany, RN as Oncology Nurse Navigator  DIAGNOSIS:    ICD-10-CM   1. Malignant neoplasm of upper-outer quadrant of left breast in female, estrogen receptor positive ([place])  C50.412    Z17.0       SUMMARY OF ONCOLOGIC HISTORY: Oncology History  Malignant neoplasm of upper-outer quadrant of left breast in female, estrogen receptor positive ([facility])  05/06/2021 Initial Diagnosis   Large circumscribed multicystic mass left breast 9 to 10 cm with multiple left axillary lymph nodes with cortical thickening: biopsy 3 o'clock position: Grade 2 IDC, ER 10%, PR 0%, Ki-67 60%, HER2 negative Right breast: 0.4 cm mass at 9:30 position: Benign on biopsy   05/12/2021 Cancer Staging   Staging form: Breast, AJCC 8th Edition - Clinical stage from 05/12/2021: Stage IIIB (cT3, cN1, cM0, G2, ER-, PR-, HER2-) - Signed by Nicholas Lose, MD on 05/12/2021 Stage prefix: Initial diagnosis Histologic grading system: 3 grade system   05/30/2021 -  Chemotherapy    Patient is on Treatment Plan: BREAST PEMBROLIZUMAB + AC Q21D X 4 CYCLES FOLLOWED BY PEMBROLIZUMAB + CARBOPLATIN D1 + PACLITAXEL D1,8,15 Q21D X 4 CYCLES          CHIEF COMPLIANT:  Cycle 6 Taxol (being held because of cytopenias)  INTERVAL HISTORY: Sheena Mcdaniel is a 50 y.o. with above-mentioned history of left breast cancer currently on neoadjuvant chemotherapy with Adriamycin and Cytoxan along with pembrolizumab. She presents to the clinic today for treatment.  Today patient's blood counts are low and hence she cannot receive treatment.  She reports that she has been feeling quite well in spite of a hemoglobin of 7.7.  She does not have any fatigue or tiredness or shortness of breath.  She is terribly disappointed that she cannot receive treatment today.  ALLERGIES:  has No Known Allergies.  MEDICATIONS:  Current  Outpatient Medications  Medication Sig Dispense Refill   lidocaine-prilocaine (EMLA) cream Apply to affected area once 30 g 3   LORazepam (ATIVAN) 0.5 MG tablet Take 1 tablet (0.5 mg total) by mouth at bedtime as needed for sleep. 30 tablet 0   ondansetron (ZOFRAN) 8 MG tablet Take 1 tablet (8 mg total) by mouth 2 (two) times daily as needed. Start on the third day after carboplatin and AC chemotherapy. 30 tablet 1   prochlorperazine (COMPAZINE) 10 MG tablet Take 1 tablet (10 mg total) by mouth every 6 (six) hours as needed (Nausea or vomiting). 30 tablet 1   traZODone (DESYREL) 50 MG tablet Take 1 tablet (50 mg total) by mouth at bedtime. 30 tablet 1   No current facility-administered medications for this visit.    PHYSICAL EXAMINATION: ECOG PERFORMANCE STATUS: 1 - Symptomatic but completely ambulatory  Vitals:   09/24/21 0931  BP: (!) 146/98  Pulse: 94  Resp: 18  Temp: (!) 97.5 F (36.4 C)  SpO2: 100%   Filed Weights   09/24/21 0931  Weight: 160 lb 1.6 oz (72.6 kg)     LABORATORY DATA:  I have reviewed the data as listed CMP Latest Ref Rng & Units 09/24/2021 09/17/2021 09/10/2021  Glucose 70 - 99 mg/dL 100(H) 102(H) 99  BUN 6 - 20 mg/dL 11 13 14   Creatinine 0.44 - 1.00 mg/dL 0.74 0.70 0.78  Sodium 135 - 145 mmol/L 141 138 141  Potassium 3.5 - 5.1 mmol/L 4.1 4.1 3.9  Chloride 98 -  111 mmol/L 106 104 107  CO2 22 - 32 mmol/L 24 26 27   Calcium 8.9 - 10.3 mg/dL 9.4 9.4 9.5  Total Protein 6.5 - 8.1 g/dL 6.8 6.9 6.9  Total Bilirubin 0.3 - 1.2 mg/dL 0.4 0.4 0.3  Alkaline Phos 38 - 126 U/L 79 89 104  AST 15 - 41 U/L 24 20 25   ALT 0 - 44 U/L 25 21 31     Lab Results  Component Value Date   WBC 1.2 (L) 09/24/2021   HGB 7.7 (L) 09/24/2021   HCT 22.3 (L) 09/24/2021   MCV 91.0 09/24/2021   PLT 141 (L) 09/24/2021   NEUTROABS PENDING 09/24/2021    ASSESSMENT & PLAN:  Malignant neoplasm of upper-outer quadrant of left breast in female, estrogen receptor positive  ([place]) 05/06/2021: Large circumscribed multicystic mass left breast 9 to 10 cm with multiple left axillary lymph nodes with cortical thickening: biopsy 3 o'clock position: Grade 2 IDC, 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  Neoadjuvant chemotherapy with Adriamycin and Cytoxan along with pembrolizumab followed by Taxol carboplatin and pembrolizumab (pembrolizumab maintenance for 1 year) 2. mastectomy with axillary lymph node dissection 3.  Adjuvant radiation 4.  Followed by adjuvant antiestrogen therapy (since she is 10% ER positive) ------------------------------------------------------------------------------------------------------------------------------------------------------ 05/20/2021 echocardiogram EF 60 to 65% 05/20/2021: CT CAP: Large left breast mass with deep involvement of left pectoralis muscle and overlying skin thickening, mild left axillary adenopathy.  No metastatic disease elsewhere 05/21/2021: Bone scan: No bone metastases 05/28/2021: Breast MRI: Large mixed cystic and solid enhancing mass involving outer left breast 10 x 7.3 x 8.5 cm posterior involving pectoralis muscle extends to the nipple mass and enhancement together 15 cm.  Left axillary lymphadenopathy. ------------------------------------------------------------------------------------------------------- Current Treatment; completed 4 cycles of Adriamycin, Cytoxan and Pembrolizumab, Today is cycle 6 [name] Keytruda every 3 weeks) we are holding today's treatment.   Chemo Toxicities:  1. Fatigue 2 to 3 days after chemo 2. Chemotherapy-induced anemia: Hemoglobin today is 7.7 since she is asymptomatic we are monitoring for now.  3. Taste appears to go away for a week after treatment. 4.  Leukopenia: We will administer Granix injections prior to each chemotherapy.    Monitoring closely for toxicities. 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   Return to clinic in 1 week for Taxol again.    No orders of the defined types were placed in this encounter.  The patient has a good understanding of the overall plan. she agrees with it. she will call with any problems that may develop before the next visit here.  Total time spent: 30 mins including face to face time and time spent for planning, charting and coordination of care  Rulon Eisenmenger, MD, MPH 09/24/2021  I, Thana Ates, am acting as scribe for Dr. Nicholas Lose.  I have reviewed the above documentation for accuracy and completeness, and I agree with the above.

## 2021-09-24 ENCOUNTER — Other Ambulatory Visit: Payer: Self-pay

## 2021-09-24 ENCOUNTER — Inpatient Hospital Stay: Payer: 59

## 2021-09-24 ENCOUNTER — Inpatient Hospital Stay (HOSPITAL_BASED_OUTPATIENT_CLINIC_OR_DEPARTMENT_OTHER): Payer: 59 | Admitting: Hematology and Oncology

## 2021-09-24 ENCOUNTER — Encounter: Payer: Self-pay | Admitting: *Deleted

## 2021-09-24 DIAGNOSIS — C50412 Malignant neoplasm of upper-outer quadrant of left female breast: Secondary | ICD-10-CM

## 2021-09-24 DIAGNOSIS — Z5112 Encounter for antineoplastic immunotherapy: Secondary | ICD-10-CM | POA: Diagnosis not present

## 2021-09-24 DIAGNOSIS — Z170 Estrogen receptor positive status [ER+]: Secondary | ICD-10-CM

## 2021-09-24 DIAGNOSIS — Z95828 Presence of other vascular implants and grafts: Secondary | ICD-10-CM

## 2021-09-24 LAB — CBC WITH DIFFERENTIAL (CANCER CENTER ONLY)
Abs Immature Granulocytes: 0 10*3/uL (ref 0.00–0.07)
Basophils Absolute: 0 10*3/uL (ref 0.0–0.1)
Basophils Relative: 2 %
Eosinophils Absolute: 0 10*3/uL (ref 0.0–0.5)
Eosinophils Relative: 1 %
HCT: 22.3 % — ABNORMAL LOW (ref 36.0–46.0)
Hemoglobin: 7.7 g/dL — ABNORMAL LOW (ref 12.0–15.0)
Immature Granulocytes: 0 %
Lymphocytes Relative: 43 %
Lymphs Abs: 0.5 10*3/uL — ABNORMAL LOW (ref 0.7–4.0)
MCH: 31.4 pg (ref 26.0–34.0)
MCHC: 34.5 g/dL (ref 30.0–36.0)
MCV: 91 fL (ref 80.0–100.0)
Monocytes Absolute: 0.2 10*3/uL (ref 0.1–1.0)
Monocytes Relative: 15 %
Neutro Abs: 0.5 10*3/uL — ABNORMAL LOW (ref 1.7–7.7)
Neutrophils Relative %: 39 %
Platelet Count: 141 10*3/uL — ABNORMAL LOW (ref 150–400)
RBC: 2.45 MIL/uL — ABNORMAL LOW (ref 3.87–5.11)
RDW: 14.2 % (ref 11.5–15.5)
WBC Count: 1.2 10*3/uL — ABNORMAL LOW (ref 4.0–10.5)
nRBC: 0 % (ref 0.0–0.2)

## 2021-09-24 LAB — CMP (CANCER CENTER ONLY)
ALT: 25 U/L (ref 0–44)
AST: 24 U/L (ref 15–41)
Albumin: 3.9 g/dL (ref 3.5–5.0)
Alkaline Phosphatase: 79 U/L (ref 38–126)
Anion gap: 11 (ref 5–15)
BUN: 11 mg/dL (ref 6–20)
CO2: 24 mmol/L (ref 22–32)
Calcium: 9.4 mg/dL (ref 8.9–10.3)
Chloride: 106 mmol/L (ref 98–111)
Creatinine: 0.74 mg/dL (ref 0.44–1.00)
GFR, Estimated: >60 mL/min (ref >60)
Glucose, Bld: 100 mg/dL — ABNORMAL HIGH (ref 70–99)
Potassium: 4.1 mmol/L (ref 3.5–5.1)
Sodium: 141 mmol/L (ref 135–145)
Total Bilirubin: 0.4 mg/dL (ref 0.3–1.2)
Total Protein: 6.8 g/dL (ref 6.5–8.1)

## 2021-09-24 LAB — TSH: TSH: 2.734 u[IU]/mL (ref 0.308–3.960)

## 2021-09-24 MED ORDER — HEPARIN SOD (PORK) LOCK FLUSH 100 UNIT/ML IV SOLN
500.0000 [IU] | Freq: Once | INTRAVENOUS | Status: AC
  Administered 2021-09-24: 500 [IU]

## 2021-09-24 MED ORDER — SODIUM CHLORIDE 0.9% FLUSH
10.0000 mL | Freq: Once | INTRAVENOUS | Status: AC
  Administered 2021-09-24: 10 mL

## 2021-09-24 MED ORDER — FILGRASTIM-AAFI 480 MCG/0.8ML IJ SOSY
480.0000 ug | PREFILLED_SYRINGE | Freq: Once | INTRAMUSCULAR | Status: AC
  Administered 2021-09-24: 480 ug via SUBCUTANEOUS
  Filled 2021-09-24: qty 0.8

## 2021-09-24 NOTE — Progress Notes (Signed)
Per Dr. Lindi Adie, hold treatment due to anemia & decreased ANC. Patient was given Nivestym injection and verbalized understanding that chemo treatment will be postponed until next week.

## 2021-09-24 NOTE — Assessment & Plan Note (Signed)
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%, HER2 negative Right breast: 0.4 cm mass at 9:30 position: Benign on biopsy  Treatment plan: 1.Neoadjuvant chemotherapy with Adriamycin and Cytoxan along with pembrolizumab followed by Taxol carboplatin and pembrolizumab (pembrolizumab maintenance for 1 year) 2.mastectomy withaxillary lymph nodedissection 3.Adjuvant radiation 4.Followed by adjuvant antiestrogen therapy (since she is 10% ER positive) ------------------------------------------------------------------------------------------------------------------------------------------------------ Current treatment: Cycle 1 day8Adriamycin and Cytoxan with pembrolizumab(this will start tomorrow) 05/20/2021 echocardiogram EF 60 to 65% 05/20/2021: CT CAP: Large left breast mass with deep involvement of left pectoralis muscle and overlying skin thickening, mild left axillary adenopathy. No metastatic disease elsewhere 05/21/2021: Bone scan: No bone metastases 05/28/2021: Breast MRI: Large mixed cystic and solid enhancing mass involving outer left breast 10 x 7.3 x 8.5 cm posterior involving pectoralis muscle extends to the nipple mass and enhancement together 15 cm. Left axillary lymphadenopathy. ------------------------------------------------------------------------------------------------------- Current Treatment;completed 4 cycles ofAdriamycin, Cytoxan and Pembrolizumab, Today is cycle 6 [name] Keytruda every 3 weeks)  Chemo Toxicities: 1. Fatigue 2 to 3 days after chemo 2. Chemotherapy-induced anemia: Hemoglobin today is 10.5, monitoring 3. Taste appears to go away for a week after treatment. 4.  Leukopenia: We will start giving her Granix injections on Tuesdays before each treatment.  Okay to treat with an [place] of 1.2 today.     Monitoring closely for toxicities. 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  Return to clinic weekly for Taxol and in 2 weeks for follow-up with me

## 2021-09-24 NOTE — Patient Instructions (Signed)
Filgrastim, G-CSF injection What is this medication? FILGRASTIM, G-CSF (fil GRA stim) is a granulocyte colony-stimulating factor that stimulates the growth of neutrophils, a type of white blood cell (WBC) important in the body's fight against infection. It is used to reduce the incidence of fever and infection in patients with certain types of cancer who are receiving chemotherapy that affects the bone marrow, to stimulate blood cell production for removal of WBCs from the body prior to a bone marrow transplantation, to reduce the incidence of fever and infection in patients who have severe chronic neutropenia, and to improve survival outcomes following high-dose radiation exposure that is toxic to the bone marrow. This medicine may be used for other purposes; ask your health care provider or pharmacist if you have questions. COMMON BRAND NAME(S): Neupogen, Nivestym, Releuko, Zarxio What should I tell my care team before I take this medication? They need to know if you have any of these conditions: kidney disease latex allergy ongoing radiation therapy sickle cell disease an unusual or allergic reaction to filgrastim, pegfilgrastim, other medicines, foods, dyes, or preservatives pregnant or trying to get pregnant breast-feeding How should I use this medication? This medicine is for injection under the skin or infusion into a vein. As an infusion into a vein, it is usually given by a health care professional in a hospital or clinic setting. If you get this medicine at home, you will be taught how to prepare and give this medicine. Refer to the Instructions for Use that come with your medication packaging. Use exactly as directed. Take your medicine at regular intervals. Do not take your medicine more often than directed. It is important that you put your used needles and syringes in a special sharps container. Do not put them in a trash can. If you do not have a sharps container, call your pharmacist  or healthcare provider to get one. Talk to your pediatrician regarding the use of this medicine in children. While this drug may be prescribed for children as young as 7 months for selected conditions, precautions do apply. Overdosage: If you think you have taken too much of this medicine contact a poison control center or emergency room at once. NOTE: This medicine is only for you. Do not share this medicine with others. What if I miss a dose? It is important not to miss your dose. Call your doctor or health care professional if you miss a dose. What may interact with this medication? This medicine may interact with the following medications: medicines that may cause a release of neutrophils, such as lithium This list may not describe all possible interactions. Give your health care provider a list of all the medicines, herbs, non-prescription drugs, or dietary supplements you use. Also tell them if you smoke, drink alcohol, or use illegal drugs. Some items may interact with your medicine. What should I watch for while using this medication? Your condition will be monitored carefully while you are receiving this medicine. You may need blood work done while you are taking this medicine. Talk to your health care provider about your risk of cancer. You may be more at risk for certain types of cancer if you take this medicine. What side effects may I notice from receiving this medication? Side effects that you should report to your doctor or health care professional as soon as possible: allergic reactions like skin rash, itching or hives, swelling of the face, lips, or tongue back pain dizziness or feeling faint fever pain, redness, or   irritation at site where injected pinpoint red spots on the skin shortness of breath or breathing problems signs and symptoms of kidney injury like trouble passing urine, change in the amount of urine, or red or dark-brown urine stomach or side pain, or pain at  the shoulder swelling tiredness unusual bleeding or bruising Side effects that usually do not require medical attention (report to your doctor or health care professional if they continue or are bothersome): bone pain cough diarrhea hair loss headache muscle pain This list may not describe all possible side effects. Call your doctor for medical advice about side effects. You may report side effects to FDA at 1-800-FDA-1088. Where should I keep my medication? Keep out of the reach of children. Store in a refrigerator between 2 and 8 degrees C (36 and 46 degrees F). Do not freeze. Keep in carton to protect from light. Throw away this medicine if vials or syringes are left out of the refrigerator for more than 24 hours. Throw away any unused medicine after the expiration date. NOTE: This sheet is a summary. It may not cover all possible information. If you have questions about this medicine, talk to your doctor, pharmacist, or health care provider.  2022 Elsevier/Gold Standard (2020-01-03 18:47:55)  

## 2021-09-25 LAB — T4: T4, Total: 10.4 ug/dL (ref 4.5–12.0)

## 2021-09-28 ENCOUNTER — Telehealth: Payer: Self-pay | Admitting: Hematology and Oncology

## 2021-09-28 ENCOUNTER — Encounter: Payer: Self-pay | Admitting: *Deleted

## 2021-09-28 NOTE — Telephone Encounter (Signed)
Sch per 9/30 inbasket, left msg

## 2021-09-29 ENCOUNTER — Other Ambulatory Visit: Payer: Self-pay | Admitting: Pharmacist

## 2021-09-29 ENCOUNTER — Other Ambulatory Visit: Payer: Self-pay

## 2021-09-29 ENCOUNTER — Inpatient Hospital Stay: Payer: 59 | Attending: Hematology and Oncology

## 2021-09-29 VITALS — BP 123/85 | HR 83 | Temp 98.0°F | Resp 18

## 2021-09-29 DIAGNOSIS — Z170 Estrogen receptor positive status [ER+]: Secondary | ICD-10-CM | POA: Insufficient documentation

## 2021-09-29 DIAGNOSIS — Z5112 Encounter for antineoplastic immunotherapy: Secondary | ICD-10-CM | POA: Insufficient documentation

## 2021-09-29 DIAGNOSIS — C50412 Malignant neoplasm of upper-outer quadrant of left female breast: Secondary | ICD-10-CM | POA: Insufficient documentation

## 2021-09-29 DIAGNOSIS — Z95828 Presence of other vascular implants and grafts: Secondary | ICD-10-CM

## 2021-09-29 DIAGNOSIS — Z5111 Encounter for antineoplastic chemotherapy: Secondary | ICD-10-CM | POA: Insufficient documentation

## 2021-09-29 MED ORDER — FILGRASTIM-AAFI 480 MCG/0.8ML IJ SOSY
480.0000 ug | PREFILLED_SYRINGE | Freq: Once | INTRAMUSCULAR | Status: AC
  Administered 2021-09-29: 480 ug via SUBCUTANEOUS
  Filled 2021-09-29: qty 0.8

## 2021-09-29 NOTE — Patient Instructions (Signed)
Filgrastim, G-CSF injection What is this medication? FILGRASTIM, G-CSF (fil GRA stim) is a granulocyte colony-stimulating factor that stimulates the growth of neutrophils, a type of white blood cell (WBC) important in the body's fight against infection. It is used to reduce the incidence of fever and infection in patients with certain types of cancer who are receiving chemotherapy that affects the bone marrow, to stimulate blood cell production for removal of WBCs from the body prior to a bone marrow transplantation, to reduce the incidence of fever and infection in patients who have severe chronic neutropenia, and to improve survival outcomes following high-dose radiation exposure that is toxic to the bone marrow. This medicine may be used for other purposes; ask your health care provider or pharmacist if you have questions. COMMON BRAND NAME(S): Neupogen, Nivestym, Releuko, Zarxio What should I tell my care team before I take this medication? They need to know if you have any of these conditions: kidney disease latex allergy ongoing radiation therapy sickle cell disease an unusual or allergic reaction to filgrastim, pegfilgrastim, other medicines, foods, dyes, or preservatives pregnant or trying to get pregnant breast-feeding How should I use this medication? This medicine is for injection under the skin or infusion into a vein. As an infusion into a vein, it is usually given by a health care professional in a hospital or clinic setting. If you get this medicine at home, you will be taught how to prepare and give this medicine. Refer to the Instructions for Use that come with your medication packaging. Use exactly as directed. Take your medicine at regular intervals. Do not take your medicine more often than directed. It is important that you put your used needles and syringes in a special sharps container. Do not put them in a trash can. If you do not have a sharps container, call your pharmacist  or healthcare provider to get one. Talk to your pediatrician regarding the use of this medicine in children. While this drug may be prescribed for children as young as 7 months for selected conditions, precautions do apply. Overdosage: If you think you have taken too much of this medicine contact a poison control center or emergency room at once. NOTE: This medicine is only for you. Do not share this medicine with others. What if I miss a dose? It is important not to miss your dose. Call your doctor or health care professional if you miss a dose. What may interact with this medication? This medicine may interact with the following medications: medicines that may cause a release of neutrophils, such as lithium This list may not describe all possible interactions. Give your health care provider a list of all the medicines, herbs, non-prescription drugs, or dietary supplements you use. Also tell them if you smoke, drink alcohol, or use illegal drugs. Some items may interact with your medicine. What should I watch for while using this medication? Your condition will be monitored carefully while you are receiving this medicine. You may need blood work done while you are taking this medicine. Talk to your health care provider about your risk of cancer. You may be more at risk for certain types of cancer if you take this medicine. What side effects may I notice from receiving this medication? Side effects that you should report to your doctor or health care professional as soon as possible: allergic reactions like skin rash, itching or hives, swelling of the face, lips, or tongue back pain dizziness or feeling faint fever pain, redness, or   irritation at site where injected pinpoint red spots on the skin shortness of breath or breathing problems signs and symptoms of kidney injury like trouble passing urine, change in the amount of urine, or red or dark-brown urine stomach or side pain, or pain at  the shoulder swelling tiredness unusual bleeding or bruising Side effects that usually do not require medical attention (report to your doctor or health care professional if they continue or are bothersome): bone pain cough diarrhea hair loss headache muscle pain This list may not describe all possible side effects. Call your doctor for medical advice about side effects. You may report side effects to FDA at 1-800-FDA-1088. Where should I keep my medication? Keep out of the reach of children. Store in a refrigerator between 2 and 8 degrees C (36 and 46 degrees F). Do not freeze. Keep in carton to protect from light. Throw away this medicine if vials or syringes are left out of the refrigerator for more than 24 hours. Throw away any unused medicine after the expiration date. NOTE: This sheet is a summary. It may not cover all possible information. If you have questions about this medicine, talk to your doctor, pharmacist, or health care provider.  2022 Elsevier/Gold Standard (2020-01-03 18:47:55)  

## 2021-10-01 ENCOUNTER — Inpatient Hospital Stay: Payer: 59

## 2021-10-01 ENCOUNTER — Other Ambulatory Visit: Payer: Self-pay

## 2021-10-01 VITALS — BP 131/87 | HR 90 | Temp 98.4°F | Resp 18 | Wt 162.6 lb

## 2021-10-01 DIAGNOSIS — Z170 Estrogen receptor positive status [ER+]: Secondary | ICD-10-CM

## 2021-10-01 DIAGNOSIS — C50412 Malignant neoplasm of upper-outer quadrant of left female breast: Secondary | ICD-10-CM

## 2021-10-01 DIAGNOSIS — Z5111 Encounter for antineoplastic chemotherapy: Secondary | ICD-10-CM | POA: Diagnosis not present

## 2021-10-01 LAB — CMP (CANCER CENTER ONLY)
ALT: 24 U/L (ref 0–44)
AST: 21 U/L (ref 15–41)
Albumin: 4.2 g/dL (ref 3.5–5.0)
Alkaline Phosphatase: 80 U/L (ref 38–126)
Anion gap: 8 (ref 5–15)
BUN: 13 mg/dL (ref 6–20)
CO2: 28 mmol/L (ref 22–32)
Calcium: 9.5 mg/dL (ref 8.9–10.3)
Chloride: 104 mmol/L (ref 98–111)
Creatinine: 0.73 mg/dL (ref 0.44–1.00)
GFR, Estimated: >60 mL/min (ref >60)
Glucose, Bld: 105 mg/dL — ABNORMAL HIGH (ref 70–99)
Potassium: 3.9 mmol/L (ref 3.5–5.1)
Sodium: 140 mmol/L (ref 135–145)
Total Bilirubin: 0.3 mg/dL (ref 0.3–1.2)
Total Protein: 6.7 g/dL (ref 6.5–8.1)

## 2021-10-01 LAB — CBC WITH DIFFERENTIAL (CANCER CENTER ONLY)
Abs Immature Granulocytes: 0.24 10*3/uL — ABNORMAL HIGH (ref 0.00–0.07)
Basophils Absolute: 0 10*3/uL (ref 0.0–0.1)
Basophils Relative: 0 %
Eosinophils Absolute: 0 10*3/uL (ref 0.0–0.5)
Eosinophils Relative: 0 %
HCT: 26.6 % — ABNORMAL LOW (ref 36.0–46.0)
Hemoglobin: 9 g/dL — ABNORMAL LOW (ref 12.0–15.0)
Immature Granulocytes: 2 %
Lymphocytes Relative: 8 %
Lymphs Abs: 0.9 10*3/uL (ref 0.7–4.0)
MCH: 32.3 pg (ref 26.0–34.0)
MCHC: 33.8 g/dL (ref 30.0–36.0)
MCV: 95.3 fL (ref 80.0–100.0)
Monocytes Absolute: 0.9 10*3/uL (ref 0.1–1.0)
Monocytes Relative: 8 %
Neutro Abs: 8.7 10*3/uL — ABNORMAL HIGH (ref 1.7–7.7)
Neutrophils Relative %: 82 %
Platelet Count: 195 10*3/uL (ref 150–400)
RBC: 2.79 MIL/uL — ABNORMAL LOW (ref 3.87–5.11)
RDW: 17.4 % — ABNORMAL HIGH (ref 11.5–15.5)
WBC Count: 10.7 10*3/uL — ABNORMAL HIGH (ref 4.0–10.5)
nRBC: 0 % (ref 0.0–0.2)

## 2021-10-01 LAB — TSH: TSH: 6.14 u[IU]/mL — ABNORMAL HIGH (ref 0.350–4.500)

## 2021-10-01 MED ORDER — SODIUM CHLORIDE 0.9 % IV SOLN: Freq: Once | INTRAVENOUS | Status: AC

## 2021-10-01 MED ORDER — DIPHENHYDRAMINE HCL 50 MG/ML IJ SOLN
50.0000 mg | Freq: Once | INTRAMUSCULAR | Status: AC
  Administered 2021-10-01: 50 mg via INTRAVENOUS
  Filled 2021-10-01: qty 1

## 2021-10-01 MED ORDER — SODIUM CHLORIDE 0.9 % IV SOLN
60.0000 mg/m2 | Freq: Once | INTRAVENOUS | Status: AC
  Administered 2021-10-01: 102 mg via INTRAVENOUS
  Filled 2021-10-01: qty 17

## 2021-10-01 MED ORDER — HEPARIN SOD (PORK) LOCK FLUSH 100 UNIT/ML IV SOLN
500.0000 [IU] | Freq: Once | INTRAVENOUS | Status: AC | PRN
  Administered 2021-10-01: 500 [IU]

## 2021-10-01 MED ORDER — FAMOTIDINE 20 MG IN NS 100 ML IVPB
20.0000 mg | Freq: Once | INTRAVENOUS | Status: AC
  Administered 2021-10-01: 20 mg via INTRAVENOUS
  Filled 2021-10-01: qty 100

## 2021-10-01 MED ORDER — SODIUM CHLORIDE 0.9 % IV SOLN
10.0000 mg | Freq: Once | INTRAVENOUS | Status: AC
  Administered 2021-10-01: 10 mg via INTRAVENOUS
  Filled 2021-10-01: qty 1

## 2021-10-01 MED ORDER — SODIUM CHLORIDE 0.9% FLUSH
10.0000 mL | INTRAVENOUS | Status: DC | PRN
  Administered 2021-10-01: 10 mL

## 2021-10-01 NOTE — Progress Notes (Signed)
Patient presents for treatment. RN assessment completed along with the following:  Labs/vitals reviewed - Yes, and within treatment parameters.   Weight within 10% of previous measurement - Yes Oncology Treatment Attestation completed for current therapy- Yes, on date 05/12/21 Informed consent completed and reflects current therapy/intent - Yes, on date 05/30/21             Provider progress note reviewed - Patient not seen by provider today. Most recent note dated 09/24/21 reviewed. Treatment/Antibody/Supportive plan reviewed - Yes, and there are no adjustments needed for today's treatment. S&H and other orders reviewed - Yes, and there are no additional orders identified. Previous treatment date reviewed - Yes, and the appropriate amount of time has elapsed between treatments.  Patient to proceed with treatment.

## 2021-10-01 NOTE — Patient Instructions (Signed)
[facility]  Discharge Instructions: Thank you for choosing [facility] to provide your oncology and hematology care.   If you have a lab appointment with the [facility], please go directly to the [facility] and check in at the registration area.   Wear comfortable clothing and clothing appropriate for easy access to any Portacath or PICC line.   We strive to give you quality time with your provider. You may need to reschedule your appointment if you arrive late (15 or more minutes).  Arriving late affects you and other patients whose appointments are after yours.  Also, if you miss three or more appointments without notifying the office, you may be dismissed from the clinic at the provider's discretion.      For prescription refill requests, have your pharmacy contact our office and allow 72 hours for refills to be completed.    Today you received the following chemotherapy and/or immunotherapy agents: Taxol.      To help prevent nausea and vomiting after your treatment, we encourage you to take your nausea medication as directed.  BELOW ARE SYMPTOMS THAT SHOULD BE REPORTED IMMEDIATELY: *FEVER GREATER THAN 100.4 F (38 C) OR HIGHER *CHILLS OR SWEATING *NAUSEA AND VOMITING THAT IS NOT CONTROLLED WITH YOUR NAUSEA MEDICATION *UNUSUAL SHORTNESS OF BREATH *UNUSUAL BRUISING OR BLEEDING *URINARY PROBLEMS (pain or burning when urinating, or frequent urination) *BOWEL PROBLEMS (unusual diarrhea, constipation, pain near the anus) TENDERNESS IN MOUTH AND THROAT WITH OR WITHOUT PRESENCE OF ULCERS (sore throat, sores in mouth, or a toothache) UNUSUAL RASH, SWELLING OR PAIN  UNUSUAL VAGINAL DISCHARGE OR ITCHING   Items with * indicate a potential emergency and should be followed up as soon as possible or go to the Emergency Department if any problems should occur.  Please show the CHEMOTHERAPY ALERT CARD or IMMUNOTHERAPY ALERT CARD at check-in to the  Emergency Department and triage nurse.  Should you have questions after your visit or need to cancel or reschedule your appointment, please contact [facility]  Dept: [phone]  and follow the prompts.  Office hours are 8:00 a.m. to 4:30 p.m. Monday - Friday. Please note that voicemails left after 4:00 p.m. may not be returned until the following business day.  We are closed weekends and major holidays. You have access to a nurse at all times for urgent questions. Please call the main number to the clinic Dept: [phone] and follow the prompts.   For any non-urgent questions, you may also contact your provider using MyChart. We now offer e-Visits for anyone 64 and older to request care online for non-urgent symptoms. For details visit [email or web address].   Also download the MyChart app! Go to the app store, search "MyChart", open the app, select [facility], and log in with your MyChart username and password.  Due to Covid, a mask is required upon entering the hospital/clinic. If you do not have a mask, one will be given to you upon arrival. For doctor visits, patients may have 1 support person aged 41 or older with them. For treatment visits, patients cannot have anyone with them due to current Covid guidelines and our immunocompromised population.

## 2021-10-02 LAB — T4: T4, Total: 9.4 ug/dL (ref 4.5–12.0)

## 2021-10-05 ENCOUNTER — Other Ambulatory Visit: Payer: Self-pay

## 2021-10-05 ENCOUNTER — Inpatient Hospital Stay: Payer: 59

## 2021-10-05 VITALS — BP 116/64 | HR 87 | Temp 98.4°F | Resp 18

## 2021-10-05 DIAGNOSIS — Z170 Estrogen receptor positive status [ER+]: Secondary | ICD-10-CM

## 2021-10-05 DIAGNOSIS — Z5111 Encounter for antineoplastic chemotherapy: Secondary | ICD-10-CM | POA: Diagnosis not present

## 2021-10-05 DIAGNOSIS — C50412 Malignant neoplasm of upper-outer quadrant of left female breast: Secondary | ICD-10-CM

## 2021-10-05 MED ORDER — FILGRASTIM-AAFI 480 MCG/0.8ML IJ SOSY
480.0000 ug | PREFILLED_SYRINGE | Freq: Once | INTRAMUSCULAR | Status: AC
  Administered 2021-10-05: 480 ug via SUBCUTANEOUS
  Filled 2021-10-05: qty 0.8

## 2021-10-05 NOTE — Patient Instructions (Signed)
Filgrastim, G-CSF injection What is this medication? FILGRASTIM, G-CSF (fil GRA stim) is a granulocyte colony-stimulating factor that stimulates the growth of neutrophils, a type of white blood cell (WBC) important in the body's fight against infection. It is used to reduce the incidence of fever and infection in patients with certain types of cancer who are receiving chemotherapy that affects the bone marrow, to stimulate blood cell production for removal of WBCs from the body prior to a bone marrow transplantation, to reduce the incidence of fever and infection in patients who have severe chronic neutropenia, and to improve survival outcomes following high-dose radiation exposure that is toxic to the bone marrow. This medicine may be used for other purposes; ask your health care provider or pharmacist if you have questions. COMMON BRAND NAME(S): Neupogen, Nivestym, Releuko, Zarxio What should I tell my care team before I take this medication? They need to know if you have any of these conditions: kidney disease latex allergy ongoing radiation therapy sickle cell disease an unusual or allergic reaction to filgrastim, pegfilgrastim, other medicines, foods, dyes, or preservatives pregnant or trying to get pregnant breast-feeding How should I use this medication? This medicine is for injection under the skin or infusion into a vein. As an infusion into a vein, it is usually given by a health care professional in a hospital or clinic setting. If you get this medicine at home, you will be taught how to prepare and give this medicine. Refer to the Instructions for Use that come with your medication packaging. Use exactly as directed. Take your medicine at regular intervals. Do not take your medicine more often than directed. It is important that you put your used needles and syringes in a special sharps container. Do not put them in a trash can. If you do not have a sharps container, call your pharmacist  or healthcare provider to get one. Talk to your pediatrician regarding the use of this medicine in children. While this drug may be prescribed for children as young as 7 months for selected conditions, precautions do apply. Overdosage: If you think you have taken too much of this medicine contact a poison control center or emergency room at once. NOTE: This medicine is only for you. Do not share this medicine with others. What if I miss a dose? It is important not to miss your dose. Call your doctor or health care professional if you miss a dose. What may interact with this medication? This medicine may interact with the following medications: medicines that may cause a release of neutrophils, such as lithium This list may not describe all possible interactions. Give your health care provider a list of all the medicines, herbs, non-prescription drugs, or dietary supplements you use. Also tell them if you smoke, drink alcohol, or use illegal drugs. Some items may interact with your medicine. What should I watch for while using this medication? Your condition will be monitored carefully while you are receiving this medicine. You may need blood work done while you are taking this medicine. Talk to your health care provider about your risk of cancer. You may be more at risk for certain types of cancer if you take this medicine. What side effects may I notice from receiving this medication? Side effects that you should report to your doctor or health care professional as soon as possible: allergic reactions like skin rash, itching or hives, swelling of the face, lips, or tongue back pain dizziness or feeling faint fever pain, redness, or   irritation at site where injected pinpoint red spots on the skin shortness of breath or breathing problems signs and symptoms of kidney injury like trouble passing urine, change in the amount of urine, or red or dark-brown urine stomach or side pain, or pain at  the shoulder swelling tiredness unusual bleeding or bruising Side effects that usually do not require medical attention (report to your doctor or health care professional if they continue or are bothersome): bone pain cough diarrhea hair loss headache muscle pain This list may not describe all possible side effects. Call your doctor for medical advice about side effects. You may report side effects to FDA at 1-800-FDA-1088. Where should I keep my medication? Keep out of the reach of children. Store in a refrigerator between 2 and 8 degrees C (36 and 46 degrees F). Do not freeze. Keep in carton to protect from light. Throw away this medicine if vials or syringes are left out of the refrigerator for more than 24 hours. Throw away any unused medicine after the expiration date. NOTE: This sheet is a summary. It may not cover all possible information. If you have questions about this medicine, talk to your doctor, pharmacist, or health care provider.  2022 Elsevier/Gold Standard (2020-01-03 18:47:55)  

## 2021-10-06 ENCOUNTER — Inpatient Hospital Stay: Payer: 59

## 2021-10-06 VITALS — BP 140/85 | HR 100 | Temp 98.7°F | Resp 18

## 2021-10-06 DIAGNOSIS — Z170 Estrogen receptor positive status [ER+]: Secondary | ICD-10-CM

## 2021-10-06 DIAGNOSIS — Z5111 Encounter for antineoplastic chemotherapy: Secondary | ICD-10-CM | POA: Diagnosis not present

## 2021-10-06 DIAGNOSIS — Z95828 Presence of other vascular implants and grafts: Secondary | ICD-10-CM

## 2021-10-06 DIAGNOSIS — C50412 Malignant neoplasm of upper-outer quadrant of left female breast: Secondary | ICD-10-CM

## 2021-10-06 MED ORDER — FILGRASTIM-AAFI 480 MCG/0.8ML IJ SOSY: 480.0000 ug | PREFILLED_SYRINGE | Freq: Once | INTRAMUSCULAR | Status: DC

## 2021-10-06 MED ORDER — FILGRASTIM-AAFI 480 MCG/0.8ML IJ SOSY
480.0000 ug | PREFILLED_SYRINGE | Freq: Once | INTRAMUSCULAR | Status: AC
  Administered 2021-10-06: 480 ug via SUBCUTANEOUS
  Filled 2021-10-06: qty 0.8

## 2021-10-07 ENCOUNTER — Inpatient Hospital Stay: Payer: 59

## 2021-10-07 ENCOUNTER — Other Ambulatory Visit: Payer: Self-pay

## 2021-10-07 VITALS — BP 128/75 | HR 92 | Temp 98.6°F | Resp 18

## 2021-10-07 DIAGNOSIS — Z5111 Encounter for antineoplastic chemotherapy: Secondary | ICD-10-CM | POA: Diagnosis not present

## 2021-10-07 DIAGNOSIS — C50412 Malignant neoplasm of upper-outer quadrant of left female breast: Secondary | ICD-10-CM

## 2021-10-07 DIAGNOSIS — Z170 Estrogen receptor positive status [ER+]: Secondary | ICD-10-CM

## 2021-10-07 MED ORDER — FILGRASTIM-AAFI 480 MCG/0.8ML IJ SOSY
480.0000 ug | PREFILLED_SYRINGE | Freq: Once | INTRAMUSCULAR | Status: AC
Start: 2021-10-07 — End: 2021-10-07
  Administered 2021-10-07: 480 ug via SUBCUTANEOUS
  Filled 2021-10-07: qty 0.8

## 2021-10-07 NOTE — Progress Notes (Signed)
Patient Care Team: Mike Craze, DO as PCP - General (Internal Medicine) Mauro Kaufmann, RN as Oncology Nurse Navigator Rockwell Germany, RN as Oncology Nurse Navigator  DIAGNOSIS:    ICD-10-CM   1. Malignant neoplasm of upper-outer quadrant of left breast in female, estrogen receptor positive ([facility])  C50.412    Z17.0       SUMMARY OF ONCOLOGIC HISTORY: Oncology History  Malignant neoplasm of upper-outer quadrant of left breast in female, estrogen receptor positive ([facility])  05/06/2021 Initial Diagnosis   Large circumscribed multicystic mass left breast 9 to 10 cm with multiple left axillary lymph nodes with cortical thickening: biopsy 3 o'clock position: Grade 2 IDC, ER 10%, PR 0%, Ki-67 60%, HER2 negative Right breast: 0.4 cm mass at 9:30 position: Benign on biopsy   05/12/2021 Cancer Staging   Staging form: Breast, AJCC 8th Edition - Clinical stage from 05/12/2021: Stage IIIB (cT3, cN1, cM0, G2, ER-, PR-, HER2-) - Signed by Nicholas Lose, MD on 05/12/2021 Stage prefix: Initial diagnosis Histologic grading system: 3 grade system   05/30/2021 -  Chemotherapy   Patient is on Treatment Plan : BREAST Pembrolizumab + AC q21d x 4 cycles followed by Pembrolizumab + Carboplatin D1 + Paclitaxel D1,8,15 q21d X 4 cycles        CHIEF COMPLIANT: Cycle 7 Taxol   INTERVAL HISTORY: Sheena Mcdaniel is a 50 y.o. with above-mentioned history of left breast cancer currently on neoadjuvant chemotherapy with Adriamycin and Cytoxan along with pembrolizumab. She presents to the clinic today for treatment.  She is tolerating chemotherapy extremely well.  Does not have any nausea or vomiting.  With Granix injections her blood counts have improved and she was able to receive last week's treatment and today her white count is up to 14.  She is relieved to hear that.  ALLERGIES:  has No Known Allergies.  MEDICATIONS:  Current Outpatient Medications  Medication Sig Dispense Refill   lidocaine-prilocaine  (EMLA) cream Apply to affected area once 30 g 3   LORazepam (ATIVAN) 0.5 MG tablet Take 1 tablet (0.5 mg total) by mouth at bedtime as needed for sleep. 30 tablet 0   ondansetron (ZOFRAN) 8 MG tablet Take 1 tablet (8 mg total) by mouth 2 (two) times daily as needed. Start on the third day after carboplatin and AC chemotherapy. 30 tablet 1   prochlorperazine (COMPAZINE) 10 MG tablet Take 1 tablet (10 mg total) by mouth every 6 (six) hours as needed (Nausea or vomiting). 30 tablet 1   traZODone (DESYREL) 50 MG tablet Take 1 tablet (50 mg total) by mouth at bedtime. 30 tablet 1   No current facility-administered medications for this visit.    PHYSICAL EXAMINATION: ECOG PERFORMANCE STATUS: 1 - Symptomatic but completely ambulatory  Vitals:   10/08/21 1043  BP: (!) 140/92  Pulse: 100  Resp: 19  Temp: (!) 97.2 F (36.2 C)  SpO2: 100%   Filed Weights   10/08/21 1043  Weight: 160 lb 11.2 oz (72.9 kg)    LABORATORY DATA:  I have reviewed the data as listed CMP Latest Ref Rng & Units 10/01/2021 09/24/2021 09/17/2021  Glucose 70 - 99 mg/dL 105(H) 100(H) 102(H)  BUN 6 - 20 mg/dL 13 11 13   Creatinine 0.44 - 1.00 mg/dL 0.73 0.74 0.70  Sodium 135 - 145 mmol/L 140 141 138  Potassium 3.5 - 5.1 mmol/L 3.9 4.1 4.1  Chloride 98 - 111 mmol/L 104 106 104  CO2 22 - 32 mmol/L 28 24  26  Calcium 8.9 - 10.3 mg/dL 9.5 9.4 9.4  Total Protein 6.5 - 8.1 g/dL 6.7 6.8 6.9  Total Bilirubin 0.3 - 1.2 mg/dL 0.3 0.4 0.4  Alkaline Phos 38 - 126 U/L 80 79 89  AST 15 - 41 U/L 21 24 20   ALT 0 - 44 U/L 24 25 21     Lab Results  Component Value Date   WBC 10.7 (H) 10/01/2021   HGB 9.0 (L) 10/01/2021   HCT 26.6 (L) 10/01/2021   MCV 95.3 10/01/2021   PLT 195 10/01/2021   NEUTROABS 8.7 (H) 10/01/2021    ASSESSMENT & PLAN:  Malignant neoplasm of upper-outer quadrant of left breast in female, estrogen receptor positive ([facility]) 05/06/2021: Large circumscribed multicystic mass left breast 9 to 10 cm with multiple  left axillary lymph nodes with cortical thickening: biopsy 3 o'clock position: Grade 2 IDC, 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  Neoadjuvant chemotherapy with Adriamycin and Cytoxan along with pembrolizumab followed by Taxol carboplatin and pembrolizumab (pembrolizumab maintenance for 1 year) 2. mastectomy with axillary lymph node dissection 3.  Adjuvant radiation 4.  Followed by adjuvant antiestrogen therapy (since she is 10% ER positive) ------------------------------------------------------------------------------------------------------------------------------------------------------ 05/20/2021 echocardiogram EF 60 to 65% 05/20/2021: CT CAP: Large left breast mass with deep involvement of left pectoralis muscle and overlying skin thickening, mild left axillary adenopathy.  No metastatic disease elsewhere 05/21/2021: Bone scan: No bone metastases 05/28/2021: Breast MRI: Large mixed cystic and solid enhancing mass involving outer left breast 10 x 7.3 x 8.5 cm posterior involving pectoralis muscle extends to the nipple mass and enhancement together 15 cm.  Left axillary lymphadenopathy. ------------------------------------------------------------------------------------------------------- Current Treatment; completed 4 cycles of Adriamycin, Cytoxan and Pembrolizumab, Today is cycle 6 Taxol Sheena Mcdaniel Keytruda every 3 weeks) we are holding today's treatment.   Chemo Toxicities:  1. Fatigue 2 to 3 days after chemo 2. Chemotherapy-induced anemia: Hemoglobin today is 7.7 since she is asymptomatic we are monitoring for now.  3. Taste appears to go away for a week after treatment. 4.  Leukopenia: After today's lab results, we decided to hold off on further Granix injections until we see that the white counts are starting to come back down again.    Monitoring closely for toxicities. 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   Return to clinic in 1 week for Taxol again.    No orders of the defined types were placed in this encounter.  The patient has a good understanding of the overall plan. she agrees with it. she will call with any problems that may develop before the next visit here.  Total time spent: 30 mins including face to face time and time spent for planning, charting and coordination of care  Rulon Eisenmenger, MD, MPH 10/08/2021  I, Thana Ates, am acting as scribe for Dr. Nicholas Lose.  I have reviewed the above documentation for accuracy and completeness, and I agree with the above.

## 2021-10-08 ENCOUNTER — Inpatient Hospital Stay: Payer: 59

## 2021-10-08 ENCOUNTER — Inpatient Hospital Stay (HOSPITAL_BASED_OUTPATIENT_CLINIC_OR_DEPARTMENT_OTHER): Payer: 59 | Admitting: Hematology and Oncology

## 2021-10-08 DIAGNOSIS — C50412 Malignant neoplasm of upper-outer quadrant of left female breast: Secondary | ICD-10-CM

## 2021-10-08 DIAGNOSIS — Z170 Estrogen receptor positive status [ER+]: Secondary | ICD-10-CM

## 2021-10-08 DIAGNOSIS — Z5111 Encounter for antineoplastic chemotherapy: Secondary | ICD-10-CM | POA: Diagnosis not present

## 2021-10-08 DIAGNOSIS — Z95828 Presence of other vascular implants and grafts: Secondary | ICD-10-CM

## 2021-10-08 LAB — CMP (CANCER CENTER ONLY)
ALT: 27 U/L (ref 0–44)
AST: 26 U/L (ref 15–41)
Albumin: 4.1 g/dL (ref 3.5–5.0)
Alkaline Phosphatase: 103 U/L (ref 38–126)
Anion gap: 8 (ref 5–15)
BUN: 15 mg/dL (ref 6–20)
CO2: 26 mmol/L (ref 22–32)
Calcium: 9.2 mg/dL (ref 8.9–10.3)
Chloride: 103 mmol/L (ref 98–111)
Creatinine: 0.72 mg/dL (ref 0.44–1.00)
GFR, Estimated: >60 mL/min (ref >60)
Glucose, Bld: 100 mg/dL — ABNORMAL HIGH (ref 70–99)
Potassium: 4 mmol/L (ref 3.5–5.1)
Sodium: 137 mmol/L (ref 135–145)
Total Bilirubin: 0.6 mg/dL (ref 0.3–1.2)
Total Protein: 6.8 g/dL (ref 6.5–8.1)

## 2021-10-08 LAB — CBC WITH DIFFERENTIAL (CANCER CENTER ONLY)
Abs Immature Granulocytes: 1.69 10*3/uL — ABNORMAL HIGH (ref 0.00–0.07)
Basophils Absolute: 0.1 10*3/uL (ref 0.0–0.1)
Basophils Relative: 1 %
Eosinophils Absolute: 0.2 10*3/uL (ref 0.0–0.5)
Eosinophils Relative: 1 %
HCT: 26.3 % — ABNORMAL LOW (ref 36.0–46.0)
Hemoglobin: 9.1 g/dL — ABNORMAL LOW (ref 12.0–15.0)
Immature Granulocytes: 12 %
Lymphocytes Relative: 8 %
Lymphs Abs: 1.1 10*3/uL (ref 0.7–4.0)
MCH: 33.3 pg (ref 26.0–34.0)
MCHC: 34.6 g/dL (ref 30.0–36.0)
MCV: 96.3 fL (ref 80.0–100.0)
Monocytes Absolute: 1.9 10*3/uL — ABNORMAL HIGH (ref 0.1–1.0)
Monocytes Relative: 13 %
Neutro Abs: 9.6 10*3/uL — ABNORMAL HIGH (ref 1.7–7.7)
Neutrophils Relative %: 65 %
Platelet Count: 429 10*3/uL — ABNORMAL HIGH (ref 150–400)
RBC: 2.73 MIL/uL — ABNORMAL LOW (ref 3.87–5.11)
RDW: 17.5 % — ABNORMAL HIGH (ref 11.5–15.5)
WBC Count: 14.6 10*3/uL — ABNORMAL HIGH (ref 4.0–10.5)
nRBC: 0.9 % — ABNORMAL HIGH (ref 0.0–0.2)

## 2021-10-08 LAB — TSH: TSH: 4.44 u[IU]/mL (ref 0.350–4.500)

## 2021-10-08 MED ORDER — SODIUM CHLORIDE 0.9 % IV SOLN
Freq: Once | INTRAVENOUS | Status: AC
Start: 2021-10-08 — End: 2021-10-08

## 2021-10-08 MED ORDER — FAMOTIDINE 20 MG IN NS 100 ML IVPB
20.0000 mg | Freq: Once | INTRAVENOUS | Status: AC
  Administered 2021-10-08: 20 mg via INTRAVENOUS
  Filled 2021-10-08: qty 100

## 2021-10-08 MED ORDER — SODIUM CHLORIDE 0.9 % IV SOLN
60.0000 mg/m2 | Freq: Once | INTRAVENOUS | Status: AC
  Administered 2021-10-08: 102 mg via INTRAVENOUS
  Filled 2021-10-08: qty 17

## 2021-10-08 MED ORDER — SODIUM CHLORIDE 0.9 % IV SOLN
200.0000 mg | Freq: Once | INTRAVENOUS | Status: AC
  Administered 2021-10-08: 200 mg via INTRAVENOUS
  Filled 2021-10-08: qty 8

## 2021-10-08 MED ORDER — DEXAMETHASONE SODIUM PHOSPHATE 100 MG/10ML IJ SOLN
10.0000 mg | Freq: Once | INTRAMUSCULAR | Status: AC
  Administered 2021-10-08: 10 mg via INTRAVENOUS
  Filled 2021-10-08: qty 10

## 2021-10-08 MED ORDER — SODIUM CHLORIDE 0.9 % IV SOLN
150.0000 mg | Freq: Once | INTRAVENOUS | Status: AC
  Administered 2021-10-08: 150 mg via INTRAVENOUS
  Filled 2021-10-08: qty 150

## 2021-10-08 MED ORDER — DIPHENHYDRAMINE HCL 50 MG/ML IJ SOLN
50.0000 mg | Freq: Once | INTRAMUSCULAR | Status: AC
  Administered 2021-10-08: 50 mg via INTRAVENOUS
  Filled 2021-10-08: qty 1

## 2021-10-08 MED ORDER — CARBOPLATIN CHEMO INJECTION 600 MG/60ML
600.0000 mg | Freq: Once | INTRAVENOUS | Status: AC
  Administered 2021-10-08: 600 mg via INTRAVENOUS
  Filled 2021-10-08: qty 60

## 2021-10-08 MED ORDER — SODIUM CHLORIDE 0.9% FLUSH
10.0000 mL | INTRAVENOUS | Status: DC | PRN
  Administered 2021-10-08: 10 mL

## 2021-10-08 MED ORDER — SODIUM CHLORIDE 0.9% FLUSH
10.0000 mL | Freq: Once | INTRAVENOUS | Status: AC
  Administered 2021-10-08: 10 mL

## 2021-10-08 MED ORDER — PALONOSETRON HCL INJECTION 0.25 MG/5ML
0.2500 mg | Freq: Once | INTRAVENOUS | Status: AC
  Administered 2021-10-08: 0.25 mg via INTRAVENOUS
  Filled 2021-10-08: qty 5

## 2021-10-08 MED ORDER — HEPARIN SOD (PORK) LOCK FLUSH 100 UNIT/ML IV SOLN
500.0000 [IU] | Freq: Once | INTRAVENOUS | Status: AC | PRN
  Administered 2021-10-08: 500 [IU]

## 2021-10-08 NOTE — Patient Instructions (Signed)
[facility] ONCOLOGY   Discharge Instructions: Thank you for choosing [facility] to provide your oncology and hematology care.   If you have a lab appointment with the [facility], please go directly to the [facility] and check in at the registration area.   Wear comfortable clothing and clothing appropriate for easy access to any Portacath or PICC line.   We strive to give you quality time with your provider. You may need to reschedule your appointment if you arrive late (15 or more minutes).  Arriving late affects you and other patients whose appointments are after yours.  Also, if you miss three or more appointments without notifying the office, you may be dismissed from the clinic at the provider's discretion.      For prescription refill requests, have your pharmacy contact our office and allow 72 hours for refills to be completed.    Today you received the following chemotherapy and/or immunotherapy agents: pembrolizumab, paclitaxel, carboplatin.      To help prevent nausea and vomiting after your treatment, we encourage you to take your nausea medication as directed.  BELOW ARE SYMPTOMS THAT SHOULD BE REPORTED IMMEDIATELY: *FEVER GREATER THAN 100.4 F (38 C) OR HIGHER *CHILLS OR SWEATING *NAUSEA AND VOMITING THAT IS NOT CONTROLLED WITH YOUR NAUSEA MEDICATION *UNUSUAL SHORTNESS OF BREATH *UNUSUAL BRUISING OR BLEEDING *URINARY PROBLEMS (pain or burning when urinating, or frequent urination) *BOWEL PROBLEMS (unusual diarrhea, constipation, pain near the anus) TENDERNESS IN MOUTH AND THROAT WITH OR WITHOUT PRESENCE OF ULCERS (sore throat, sores in mouth, or a toothache) UNUSUAL RASH, SWELLING OR PAIN  UNUSUAL VAGINAL DISCHARGE OR ITCHING   Items with * indicate a potential emergency and should be followed up as soon as possible or go to the Emergency Department if any problems should occur.  Please show the CHEMOTHERAPY ALERT CARD or  IMMUNOTHERAPY ALERT CARD at check-in to the Emergency Department and triage nurse.  Should you have questions after your visit or need to cancel or reschedule your appointment, please contact [facility]  Dept: [phone]  and follow the prompts.  Office hours are 8:00 a.m. to 4:30 p.m. Monday - Friday. Please note that voicemails left after 4:00 p.m. may not be returned until the following business day.  We are closed weekends and major holidays. You have access to a nurse at all times for urgent questions. Please call the main number to the clinic Dept: [phone] and follow the prompts.   For any non-urgent questions, you may also contact your provider using MyChart. We now offer e-Visits for anyone 57 and older to request care online for non-urgent symptoms. For details visit [email or web address].   Also download the MyChart app! Go to the app store, search "MyChart", open the app, select [facility], and log in with your MyChart username and password.  Due to Covid, a mask is required upon entering the hospital/clinic. If you do not have a mask, one will be given to you upon arrival. For doctor visits, patients may have 1 support person aged 24 or older with them. For treatment visits, patients cannot have anyone with them due to current Covid guidelines and our immunocompromised population.

## 2021-10-08 NOTE — Assessment & Plan Note (Signed)
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%, HER2 negative Right breast: 0.4 cm mass at 9:30 position: Benign on biopsy  Treatment plan: 1.Neoadjuvant chemotherapy with Adriamycin and Cytoxan along with pembrolizumab followed by Taxol carboplatin and pembrolizumab (pembrolizumab maintenance for 1 year) 2.mastectomy withaxillary lymph nodedissection 3.Adjuvant radiation 4.Followed by adjuvant antiestrogen therapy (since she is 10% ER positive) ------------------------------------------------------------------------------------------------------------------------------------------------------ 05/20/2021 echocardiogram EF 60 to 65% 05/20/2021: CT CAP: Large left breast mass with deep involvement of left pectoralis muscle and overlying skin thickening, mild left axillary adenopathy. No metastatic disease elsewhere 05/21/2021: Bone scan: No bone metastases 05/28/2021: Breast MRI: Large mixed cystic and solid enhancing mass involving outer left breast 10 x 7.3 x 8.5 cm posterior involving pectoralis muscle extends to the nipple mass and enhancement together 15 cm. Left axillary lymphadenopathy. ------------------------------------------------------------------------------------------------------- Current Treatment;completed 4 cycles ofAdriamycin, Cytoxan and Pembrolizumab, Today is cycle6Taxol Drucilla Schmidt 3 weeks) we are holding today's treatment.  Chemo Toxicities: 1.Fatigue2 to 3 days after chemo 2.Chemotherapy-induced anemia: Hemoglobin today is 7.7 since she is asymptomatic we are monitoring for now.  3.Taste appears to go away for a week after treatment. 4.Leukopenia: We will administer Granix injections prior to each chemotherapy.  Monitoring closely for toxicities. 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  Return to clinic in 1 week for Taxol again.

## 2021-10-09 LAB — T4: T4, Total: 10.1 ug/dL (ref 4.5–12.0)

## 2021-10-14 ENCOUNTER — Telehealth: Payer: Self-pay | Admitting: Hematology and Oncology

## 2021-10-14 MED FILL — Dexamethasone Sodium Phosphate Inj 100 MG/10ML: INTRAMUSCULAR | Qty: 1 | Status: AC

## 2021-10-14 NOTE — Telephone Encounter (Signed)
Sch per 9/29 los, pt aware 

## 2021-10-15 ENCOUNTER — Inpatient Hospital Stay: Payer: 59

## 2021-10-15 ENCOUNTER — Other Ambulatory Visit: Payer: Self-pay

## 2021-10-15 VITALS — BP 127/85 | HR 106 | Temp 98.5°F | Resp 17 | Wt 158.5 lb

## 2021-10-15 DIAGNOSIS — C50412 Malignant neoplasm of upper-outer quadrant of left female breast: Secondary | ICD-10-CM

## 2021-10-15 DIAGNOSIS — Z95828 Presence of other vascular implants and grafts: Secondary | ICD-10-CM

## 2021-10-15 DIAGNOSIS — Z170 Estrogen receptor positive status [ER+]: Secondary | ICD-10-CM

## 2021-10-15 DIAGNOSIS — Z5111 Encounter for antineoplastic chemotherapy: Secondary | ICD-10-CM | POA: Diagnosis not present

## 2021-10-15 LAB — CBC WITH DIFFERENTIAL (CANCER CENTER ONLY)
Abs Immature Granulocytes: 0.01 10*3/uL (ref 0.00–0.07)
Basophils Absolute: 0 10*3/uL (ref 0.0–0.1)
Basophils Relative: 2 %
Eosinophils Absolute: 0 10*3/uL (ref 0.0–0.5)
Eosinophils Relative: 1 %
HCT: 26.5 % — ABNORMAL LOW (ref 36.0–46.0)
Hemoglobin: 9.3 g/dL — ABNORMAL LOW (ref 12.0–15.0)
Immature Granulocytes: 1 %
Lymphocytes Relative: 35 %
Lymphs Abs: 0.5 10*3/uL — ABNORMAL LOW (ref 0.7–4.0)
MCH: 32.9 pg (ref 26.0–34.0)
MCHC: 35.1 g/dL (ref 30.0–36.0)
MCV: 93.6 fL (ref 80.0–100.0)
Monocytes Absolute: 0.2 10*3/uL (ref 0.1–1.0)
Monocytes Relative: 17 %
Neutro Abs: 0.6 10*3/uL — ABNORMAL LOW (ref 1.7–7.7)
Neutrophils Relative %: 44 %
Platelet Count: 200 10*3/uL (ref 150–400)
RBC: 2.83 MIL/uL — ABNORMAL LOW (ref 3.87–5.11)
RDW: 15.1 % (ref 11.5–15.5)
WBC Count: 1.3 10*3/uL — ABNORMAL LOW (ref 4.0–10.5)
nRBC: 0 % (ref 0.0–0.2)

## 2021-10-15 LAB — CMP (CANCER CENTER ONLY)
ALT: 25 U/L (ref 0–44)
AST: 23 U/L (ref 15–41)
Albumin: 4.1 g/dL (ref 3.5–5.0)
Alkaline Phosphatase: 71 U/L (ref 38–126)
Anion gap: 9 (ref 5–15)
BUN: 14 mg/dL (ref 6–20)
CO2: 25 mmol/L (ref 22–32)
Calcium: 9.2 mg/dL (ref 8.9–10.3)
Chloride: 100 mmol/L (ref 98–111)
Creatinine: 0.63 mg/dL (ref 0.44–1.00)
GFR, Estimated: >60 mL/min (ref >60)
Glucose, Bld: 99 mg/dL (ref 70–99)
Potassium: 4 mmol/L (ref 3.5–5.1)
Sodium: 134 mmol/L — ABNORMAL LOW (ref 135–145)
Total Bilirubin: 0.6 mg/dL (ref 0.3–1.2)
Total Protein: 7.1 g/dL (ref 6.5–8.1)

## 2021-10-15 LAB — TSH: TSH: 2.786 u[IU]/mL (ref 0.350–4.500)

## 2021-10-15 MED ORDER — SODIUM CHLORIDE 0.9% FLUSH
10.0000 mL | Freq: Once | INTRAVENOUS | Status: AC
  Administered 2021-10-15: 10 mL

## 2021-10-15 MED ORDER — SODIUM CHLORIDE 0.9 % IV SOLN: Freq: Once | INTRAVENOUS | Status: AC

## 2021-10-15 MED ORDER — FILGRASTIM-AAFI 480 MCG/0.8ML IJ SOSY
480.0000 ug | PREFILLED_SYRINGE | Freq: Once | INTRAMUSCULAR | Status: AC
  Administered 2021-10-15: 480 ug via SUBCUTANEOUS
  Filled 2021-10-15: qty 0.8

## 2021-10-15 MED ORDER — HEPARIN SOD (PORK) LOCK FLUSH 100 UNIT/ML IV SOLN
500.0000 [IU] | Freq: Once | INTRAVENOUS | Status: AC
  Administered 2021-10-15: 500 [IU]

## 2021-10-15 NOTE — Progress Notes (Signed)
Hold treatment today based on ANC, give Nivestym and 500cc NS per Dr Lindi Adie

## 2021-10-15 NOTE — Patient Instructions (Signed)
Dehydration, Adult Dehydration is a condition in which there is not enough water or other fluids in the body. This happens when a person loses more fluids than he or she takes in. Important organs, such as the kidneys, brain, and heart, cannot function without a proper amount of fluids. Any loss of fluids from the body can lead to dehydration. Dehydration can be mild, moderate, or severe. It should be treated right away to prevent it from becoming severe. What are the causes? Dehydration may be caused by: Conditions that cause loss of water or other fluids, such as diarrhea, vomiting, or sweating or urinating a lot. Not drinking enough fluids, especially when you are ill or doing activities that require a lot of energy. Other illnesses and conditions, such as fever or infection. Certain medicines, such as medicines that remove excess fluid from the body (diuretics). Lack of safe drinking water. Not being able to get enough water and food. What increases the risk? The following factors may make you more likely to develop this condition: Having a long-term (chronic) illness that has not been treated properly, such as diabetes, heart disease, or kidney disease. Being 65 years of age or older. Having a disability. Living in a place that is high in altitude, where thinner, drier air causes more fluid loss. Doing exercises that put stress on your body for a long time (endurance sports). What are the signs or symptoms? Symptoms of dehydration depend on how severe it is. Mild or moderate dehydration Thirst. Dry lips or dry mouth. Dizziness or light-headedness, especially when standing up from a seated position. Muscle cramps. Dark urine. Urine may be the color of tea. Less urine or tears produced than usual. Headache. Severe dehydration Changes in skin. Your skin may be cold and clammy, blotchy, or pale. Your skin also may not return to normal after being lightly pinched and released. Little or  no tears, urine, or sweat. Changes in vital signs, such as rapid breathing and low blood pressure. Your pulse may be weak or may be faster than 100 beats a minute when you are sitting still. Other changes, such as: Feeling very thirsty. Sunken eyes. Cold hands and feet. Confusion. Being very tired (lethargic) or having trouble waking from sleep. Short-term weight loss. Loss of consciousness. How is this diagnosed? This condition is diagnosed based on your symptoms and a physical exam. You may have blood and urine tests to help confirm the diagnosis. How is this treated? Treatment for this condition depends on how severe it is. Treatment should be started right away. Do not wait until dehydration becomes severe. Severe dehydration is an emergency and needs to be treated in a hospital. Mild or moderate dehydration can be treated at home. You may be asked to: Drink more fluids. Drink an oral rehydration solution (ORS). This drink helps restore proper amounts of fluids and salts and minerals in the blood (electrolytes). Severe dehydration can be treated: With IV fluids. By correcting abnormal levels of electrolytes. This is often done by giving electrolytes through a tube that is passed through your nose and into your stomach (nasogastric tube, or NG tube). By treating the underlying cause of dehydration. Follow these instructions at home: Oral rehydration solution If told by your health care provider, drink an ORS: Make an ORS by following instructions on the package. Start by drinking small amounts, about  cup (120 mL) every 5-10 minutes. Slowly increase how much you drink until you have taken the amount recommended by your health   care provider. Eating and drinking     Drink enough clear fluid to keep your urine pale yellow. If you were told to drink an ORS, finish the ORS first and then start slowly drinking other clear fluids. Drink fluids such as: Water. Do not drink only water.  Doing that can lead to hyponatremia, which is having too little salt (sodium) in the body. Water from ice chips you suck on. Fruit juice that you have added water to (diluted fruit juice). Low-calorie sports drinks. Eat foods that contain a healthy balance of electrolytes, such as bananas, oranges, potatoes, tomatoes, and spinach. Do not drink alcohol. Avoid the following: Drinks that contain a lot of sugar. These include high-calorie sports drinks, fruit juice that is not diluted, and soda. Caffeine. Foods that are greasy or contain a lot of fat or sugar. General instructions Take over-the-counter and prescription medicines only as told by your health care provider. Do not take sodium tablets. Doing that can lead to having too much sodium in the body (hypernatremia). Return to your normal activities as told by your health care provider. Ask your health care provider what activities are safe for you. Keep all follow-up visits as told by your health care provider. This is important. Contact a health care provider if: You have muscle cramps, pain, or discomfort, such as: Pain in your abdomen and the pain gets worse or stays in one area (localizes). Stiff neck. You have a rash. You are more irritable than usual. You are sleepier or have a harder time waking than usual. You feel weak or dizzy. You feel very thirsty. Get help right away if you have: Any symptoms of severe dehydration. Symptoms of vomiting, such as: You cannot eat or drink without vomiting. Vomiting gets worse or does not go away. Vomit includes blood or green matter (bile). Symptoms that get worse with treatment. A fever. A severe headache. Problems with urination or bowel movements, such as: Diarrhea that gets worse or does not go away. Blood in your stool (feces). This may cause stool to look black and tarry. Not urinating, or urinating only a small amount of very dark urine, within 6-8 hours. Trouble  breathing. These symptoms may represent a serious problem that is an emergency. Do not wait to see if the symptoms will go away. Get medical help right away. Call your local emergency services (911 in the [place] Do not drive yourself to the hospital. Summary Dehydration is a condition in which there is not enough water or other fluids in the body. This happens when a person loses more fluids than he or she takes in. Treatment for this condition depends on how severe it is. Treatment should be started right away. Do not wait until dehydration becomes severe. Drink enough clear fluid to keep your urine pale yellow. If you were told to drink an oral rehydration solution (ORS), finish the ORS first and then start slowly drinking other clear fluids. Take over-the-counter and prescription medicines only as told by your health care provider. Get help right away if you have any symptoms of severe dehydration. This information is not intended to replace advice given to you by your health care provider. Make sure you discuss any questions you have with your health care provider. Document Revised: 07/26/2019 Document Reviewed: 07/26/2019 Elsevier Patient Education  [employer].  Filgrastim, G-CSF injection What is this medication? FILGRASTIM, G-CSF (fil GRA stim) is a granulocyte colony-stimulating factor that stimulates the growth of neutrophils, a type of  white blood cell (WBC) important in the body's fight against infection. It is used to reduce the incidence of fever and infection in patients with certain types of cancer who are receiving chemotherapy that affects the bone marrow, to stimulate blood cell production for removal of WBCs from the body prior to a bone marrow transplantation, to reduce the incidence of fever and infection in patients who have severe chronic neutropenia, and to improve survival outcomes following high-dose radiation exposure that is toxic to the bone marrow. This medicine  may be used for other purposes; ask your health care provider or pharmacist if you have questions. COMMON BRAND NAME(S): Neupogen, Nivestym, Releuko, Zarxio What should I tell my care team before I take this medication? They need to know if you have any of these conditions: kidney disease latex allergy ongoing radiation therapy sickle cell disease an unusual or allergic reaction to filgrastim, pegfilgrastim, other medicines, foods, dyes, or preservatives pregnant or trying to get pregnant breast-feeding How should I use this medication? This medicine is for injection under the skin or infusion into a vein. As an infusion into a vein, it is usually given by a health care professional in a hospital or clinic setting. If you get this medicine at home, you will be taught how to prepare and give this medicine. Refer to the Instructions for Use that come with your medication packaging. Use exactly as directed. Take your medicine at regular intervals. Do not take your medicine more often than directed. It is important that you put your used needles and syringes in a special sharps container. Do not put them in a trash can. If you do not have a sharps container, call your pharmacist or healthcare provider to get one. Talk to your pediatrician regarding the use of this medicine in children. While this drug may be prescribed for children as young as 7 months for selected conditions, precautions do apply. Overdosage: If you think you have taken too much of this medicine contact a poison control center or emergency room at once. NOTE: This medicine is only for you. Do not share this medicine with others. What if I miss a dose? It is important not to miss your dose. Call your doctor or health care professional if you miss a dose. What may interact with this medication? This medicine may interact with the following medications: medicines that may cause a release of neutrophils, such as lithium This list may  not describe all possible interactions. Give your health care provider a list of all the medicines, herbs, non-prescription drugs, or dietary supplements you use. Also tell them if you smoke, drink alcohol, or use illegal drugs. Some items may interact with your medicine. What should I watch for while using this medication? Your condition will be monitored carefully while you are receiving this medicine. You may need blood work done while you are taking this medicine. Talk to your health care provider about your risk of cancer. You may be more at risk for certain types of cancer if you take this medicine. What side effects may I notice from receiving this medication? Side effects that you should report to your doctor or health care professional as soon as possible: allergic reactions like skin rash, itching or hives, swelling of the face, lips, or tongue back pain dizziness or feeling faint fever pain, redness, or irritation at site where injected pinpoint red spots on the skin shortness of breath or breathing problems signs and symptoms of kidney injury like trouble  passing urine, change in the amount of urine, or red or dark-brown urine stomach or side pain, or pain at the shoulder swelling tiredness unusual bleeding or bruising Side effects that usually do not require medical attention (report to your doctor or health care professional if they continue or are bothersome): bone pain cough diarrhea hair loss headache muscle pain This list may not describe all possible side effects. Call your doctor for medical advice about side effects. You may report side effects to FDA at 1-800-FDA-1088. Where should I keep my medication? Keep out of the reach of children. Store in a refrigerator between 2 and 8 degrees C (36 and 46 degrees F). Do not freeze. Keep in carton to protect from light. Throw away this medicine if vials or syringes are left out of the refrigerator for more than 24 hours.  Throw away any unused medicine after the expiration date. NOTE: This sheet is a summary. It may not cover all possible information. If you have questions about this medicine, talk to your doctor, pharmacist, or health care provider.  2022 Elsevier/Gold Standard (2020-01-03 18:47:55)

## 2021-10-16 LAB — T4: T4, Total: 11.4 ug/dL (ref 4.5–12.0)

## 2021-10-21 MED FILL — Dexamethasone Sodium Phosphate Inj 100 MG/10ML: INTRAMUSCULAR | Qty: 1 | Status: AC

## 2021-10-21 NOTE — Progress Notes (Signed)
Patient Care Team: Mike Craze, DO as PCP - General (Internal Medicine) Mauro Kaufmann, RN as Oncology Nurse Navigator Rockwell Germany, RN as Oncology Nurse Navigator  DIAGNOSIS:    ICD-10-CM   1. Malignant neoplasm of upper-outer quadrant of left breast in female, estrogen receptor positive ([place])  C50.412    Z17.0       SUMMARY OF ONCOLOGIC HISTORY: Oncology History  Malignant neoplasm of upper-outer quadrant of left breast in female, estrogen receptor positive ([place])  05/06/2021 Initial Diagnosis   Large circumscribed multicystic mass left breast 9 to 10 cm with multiple left axillary lymph nodes with cortical thickening: biopsy 3 o'clock position: Grade 2 IDC, ER 10%, PR 0%, Ki-67 60%, HER2 negative Right breast: 0.4 cm mass at 9:30 position: Benign on biopsy   05/12/2021 Cancer Staging   Staging form: Breast, AJCC 8th Edition - Clinical stage from 05/12/2021: Stage IIIB (cT3, cN1, cM0, G2, ER-, PR-, HER2-) - Signed by Nicholas Lose, MD on 05/12/2021 Stage prefix: Initial diagnosis Histologic grading system: 3 grade system    05/30/2021 -  Chemotherapy   Patient is on Treatment Plan : BREAST Pembrolizumab + AC q21d x 4 cycles followed by Pembrolizumab + Carboplatin D1 + Paclitaxel D1,8,15 q21d X 4 cycles        CHIEF COMPLIANT: Day 15, Cycle 7 Taxol  INTERVAL HISTORY: Sheena Mcdaniel is a 50 y.o. with above-mentioned history of breast cancer currently on neoadjuvant chemotherapy with Adriamycin and Cytoxan along with pembrolizumab. She presents to the clinic today for treatment.   ALLERGIES:  has No Known Allergies.  MEDICATIONS:  Current Outpatient Medications  Medication Sig Dispense Refill   lidocaine-prilocaine (EMLA) cream Apply to affected area once 30 g 3   LORazepam (ATIVAN) 0.5 MG tablet Take 1 tablet (0.5 mg total) by mouth at bedtime as needed for sleep. 30 tablet 0   ondansetron (ZOFRAN) 8 MG tablet Take 1 tablet (8 mg total) by mouth 2 (two) times daily  as needed. Start on the third day after carboplatin and AC chemotherapy. 30 tablet 1   prochlorperazine (COMPAZINE) 10 MG tablet Take 1 tablet (10 mg total) by mouth every 6 (six) hours as needed (Nausea or vomiting). 30 tablet 1   traZODone (DESYREL) 50 MG tablet Take 1 tablet (50 mg total) by mouth at bedtime. 30 tablet 1   No current facility-administered medications for this visit.    PHYSICAL EXAMINATION: ECOG PERFORMANCE STATUS: 1 - Symptomatic but completely ambulatory  There were no vitals filed for this visit. There were no vitals filed for this visit.  LABORATORY DATA:  I have reviewed the data as listed CMP Latest Ref Rng & Units 10/15/2021 10/08/2021 10/01/2021  Glucose 70 - 99 mg/dL 99 100(H) 105(H)  BUN 6 - 20 mg/dL _0 Creatinine 0.44 - 1.00 mg/dL 0.63 0.72 0.73  Sodium 135 - 145 mmol/L 134(L) 137 140  Potassium 3.5 - 5.1 mmol/L 4.0 4.0 3.9  Chloride 98 - 111 mmol/L 100 103 104  CO2 22 - 32 mmol/L _1 Calcium 8.9 - 10.3 mg/dL 9.2 9.2 9.5  Total Protein 6.5 - 8.1 g/dL 7.1 6.8 6.7  Total Bilirubin 0.3 - 1.2 mg/dL 0.6 0.6 0.3  Alkaline Phos 38 - 126 U/L 71 103 80  AST 15 - 41 U/L _2 ALT 0 - 44 U/L _3 Lab Results  Component Value Date   WBC 1.3 (L) 10/15/2021  HGB 9.3 (L) 10/15/2021   HCT 26.5 (L) 10/15/2021   MCV 93.6 10/15/2021   PLT 200 10/15/2021   NEUTROABS 0.6 (L) 10/15/2021    ASSESSMENT & PLAN:  Malignant neoplasm of upper-outer quadrant of left breast in female, estrogen receptor positive ([place]) 05/06/2021: Large circumscribed multicystic mass left breast 9 to 10 cm with multiple left axillary lymph nodes with cortical thickening: biopsy 3 o'clock position: Grade 2 IDC, 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  Neoadjuvant chemotherapy with Adriamycin and Cytoxan along with pembrolizumab followed by Taxol carboplatin and pembrolizumab  (pembrolizumab maintenance for 1 year) 2. mastectomy with axillary lymph node dissection 3.  Adjuvant radiation 4.  Followed by adjuvant antiestrogen therapy (since she is 10% ER positive) ------------------------------------------------------------------------------------------------------------------------------------------------------ 05/20/2021 echocardiogram EF 60 to 65% 05/20/2021: CT CAP: Large left breast mass with deep involvement of left pectoralis muscle and overlying skin thickening, mild left axillary adenopathy.  No metastatic disease elsewhere 05/21/2021: Bone scan: No bone metastases 05/28/2021: Breast MRI: Large mixed cystic and solid enhancing mass involving outer left breast 10 x 7.3 x 8.5 cm posterior involving pectoralis muscle extends to the nipple mass and enhancement together 15 cm.  Left axillary lymphadenopathy. ------------------------------------------------------------------------------------------------------- Current Treatment; completed 4 cycles of Adriamycin, Cytoxan and Pembrolizumab, Today is cycle 8 Taxol Sheena Mcdaniel Keytruda every 3 weeks)    Chemo Toxicities:  1. Fatigue 2 to 3 days after chemo 2. Chemotherapy-induced anemia: Hemoglobin today is 8.5 since she is asymptomatic we are monitoring for now.  3. Taste appears to go away for a week after treatment. 4.  Leukopenia: Patient will need Granix prior to each treatment.    Monitoring closely for toxicities. 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   Return to clinic in 1 week for Taxol again.  No orders of the defined types were placed in this encounter.  The patient has a good understanding of the overall plan. she agrees with it. she will call with any problems that may develop before the next visit here.  Total time spent: 30 mins including face to face time and time spent for planning, charting and coordination of care  Rulon Eisenmenger, MD,  [facility] 10/22/2021  I, Thana Ates, am acting as scribe for Dr. Nicholas Lose.  I have reviewed the above documentation for accuracy and completeness, and I agree with the above.

## 2021-10-22 ENCOUNTER — Inpatient Hospital Stay (HOSPITAL_BASED_OUTPATIENT_CLINIC_OR_DEPARTMENT_OTHER): Payer: 59 | Admitting: Hematology and Oncology

## 2021-10-22 ENCOUNTER — Other Ambulatory Visit: Payer: Self-pay

## 2021-10-22 ENCOUNTER — Encounter: Payer: Self-pay | Admitting: *Deleted

## 2021-10-22 ENCOUNTER — Inpatient Hospital Stay: Payer: 59

## 2021-10-22 VITALS — HR 98

## 2021-10-22 DIAGNOSIS — Z170 Estrogen receptor positive status [ER+]: Secondary | ICD-10-CM

## 2021-10-22 DIAGNOSIS — Z95828 Presence of other vascular implants and grafts: Secondary | ICD-10-CM

## 2021-10-22 DIAGNOSIS — Z5111 Encounter for antineoplastic chemotherapy: Secondary | ICD-10-CM | POA: Diagnosis not present

## 2021-10-22 DIAGNOSIS — C50412 Malignant neoplasm of upper-outer quadrant of left female breast: Secondary | ICD-10-CM | POA: Diagnosis not present

## 2021-10-22 LAB — CMP (CANCER CENTER ONLY)
ALT: 18 U/L (ref 0–44)
AST: 21 U/L (ref 15–41)
Albumin: 3.5 g/dL (ref 3.5–5.0)
Alkaline Phosphatase: 86 U/L (ref 38–126)
Anion gap: 12 (ref 5–15)
BUN: 9 mg/dL (ref 6–20)
CO2: 24 mmol/L (ref 22–32)
Calcium: 9.4 mg/dL (ref 8.9–10.3)
Chloride: 104 mmol/L (ref 98–111)
Creatinine: 0.78 mg/dL (ref 0.44–1.00)
GFR, Estimated: >60 mL/min (ref >60)
Glucose, Bld: 133 mg/dL — ABNORMAL HIGH (ref 70–99)
Potassium: 3.8 mmol/L (ref 3.5–5.1)
Sodium: 140 mmol/L (ref 135–145)
Total Bilirubin: 0.3 mg/dL (ref 0.3–1.2)
Total Protein: 7 g/dL (ref 6.5–8.1)

## 2021-10-22 LAB — CBC WITH DIFFERENTIAL (CANCER CENTER ONLY)
Abs Immature Granulocytes: 0.12 10*3/uL — ABNORMAL HIGH (ref 0.00–0.07)
Basophils Absolute: 0 10*3/uL (ref 0.0–0.1)
Basophils Relative: 1 %
Eosinophils Absolute: 0 10*3/uL (ref 0.0–0.5)
Eosinophils Relative: 0 %
HCT: 25 % — ABNORMAL LOW (ref 36.0–46.0)
Hemoglobin: 8.5 g/dL — ABNORMAL LOW (ref 12.0–15.0)
Immature Granulocytes: 3 %
Lymphocytes Relative: 19 %
Lymphs Abs: 0.8 10*3/uL (ref 0.7–4.0)
MCH: 32.4 pg (ref 26.0–34.0)
MCHC: 34 g/dL (ref 30.0–36.0)
MCV: 95.4 fL (ref 80.0–100.0)
Monocytes Absolute: 0.6 10*3/uL (ref 0.1–1.0)
Monocytes Relative: 16 %
Neutro Abs: 2.4 10*3/uL (ref 1.7–7.7)
Neutrophils Relative %: 61 %
Platelet Count: 164 10*3/uL (ref 150–400)
RBC: 2.62 MIL/uL — ABNORMAL LOW (ref 3.87–5.11)
RDW: 15.1 % (ref 11.5–15.5)
WBC Count: 4 10*3/uL (ref 4.0–10.5)
nRBC: 0 % (ref 0.0–0.2)

## 2021-10-22 LAB — TSH: TSH: 2.683 u[IU]/mL (ref 0.308–3.960)

## 2021-10-22 MED ORDER — SODIUM CHLORIDE 0.9% FLUSH
10.0000 mL | Freq: Once | INTRAVENOUS | Status: AC
  Administered 2021-10-22: 10 mL

## 2021-10-22 MED ORDER — SODIUM CHLORIDE 0.9 % IV SOLN: Freq: Once | INTRAVENOUS | Status: AC

## 2021-10-22 MED ORDER — SODIUM CHLORIDE 0.9 % IV SOLN
10.0000 mg | Freq: Once | INTRAVENOUS | Status: AC
  Administered 2021-10-22: 10 mg via INTRAVENOUS
  Filled 2021-10-22: qty 10

## 2021-10-22 MED ORDER — DIPHENHYDRAMINE HCL 50 MG/ML IJ SOLN
50.0000 mg | Freq: Once | INTRAMUSCULAR | Status: AC
  Administered 2021-10-22: 50 mg via INTRAVENOUS
  Filled 2021-10-22: qty 1

## 2021-10-22 MED ORDER — SODIUM CHLORIDE 0.9 % IV SOLN
60.0000 mg/m2 | Freq: Once | INTRAVENOUS | Status: AC
  Administered 2021-10-22: 102 mg via INTRAVENOUS
  Filled 2021-10-22: qty 17

## 2021-10-22 MED ORDER — FAMOTIDINE 20 MG IN NS 100 ML IVPB
20.0000 mg | Freq: Once | INTRAVENOUS | Status: AC
  Administered 2021-10-22: 20 mg via INTRAVENOUS
  Filled 2021-10-22: qty 100

## 2021-10-22 NOTE — Patient Instructions (Signed)
[facility]   Discharge Instructions: Thank you for choosing [facility] to provide your oncology and hematology care.   If you have a lab appointment with the Cancer Center, please go directly to the Cancer Center and check in at the registration area.   Wear comfortable clothing and clothing appropriate for easy access to any Portacath or PICC line.   We strive to give you quality time with your provider. You may need to reschedule your appointment if you arrive late (15 or more minutes).  Arriving late affects you and other patients whose appointments are after yours.  Also, if you miss three or more appointments without notifying the office, you may be dismissed from the clinic at the provider's discretion.      For prescription refill requests, have your pharmacy contact our office and allow 72 hours for refills to be completed.    Today you received the following chemotherapy and/or immunotherapy agents: paclitaxel.      To help prevent nausea and vomiting after your treatment, we encourage you to take your nausea medication as directed.  BELOW ARE SYMPTOMS THAT SHOULD BE REPORTED IMMEDIATELY: *FEVER GREATER THAN 100.4 F (38 C) OR HIGHER *CHILLS OR SWEATING *NAUSEA AND VOMITING THAT IS NOT CONTROLLED WITH YOUR NAUSEA MEDICATION *UNUSUAL SHORTNESS OF BREATH *UNUSUAL BRUISING OR BLEEDING *URINARY PROBLEMS (pain or burning when urinating, or frequent urination) *BOWEL PROBLEMS (unusual diarrhea, constipation, pain near the anus) TENDERNESS IN MOUTH AND THROAT WITH OR WITHOUT PRESENCE OF ULCERS (sore throat, sores in mouth, or a toothache) UNUSUAL RASH, SWELLING OR PAIN  UNUSUAL VAGINAL DISCHARGE OR ITCHING   Items with * indicate a potential emergency and should be followed up as soon as possible or go to the Emergency Department if any problems should occur.  Please show the CHEMOTHERAPY ALERT CARD or IMMUNOTHERAPY ALERT CARD at check-in  to the Emergency Department and triage nurse.  Should you have questions after your visit or need to cancel or reschedule your appointment, please contact [facility]  Dept: [phone]  and follow the prompts.  Office hours are 8:00 a.m. to 4:30 p.m. Monday - Friday. Please note that voicemails left after 4:00 p.m. may not be returned until the following business day.  We are closed weekends and major holidays. You have access to a nurse at all times for urgent questions. Please call the main number to the clinic Dept: [phone] and follow the prompts.   For any non-urgent questions, you may also contact your provider using MyChart. We now offer e-Visits for anyone 18 and older to request care online for non-urgent symptoms. For details visit [email or web address].   Also download the MyChart app! Go to the app store, search "MyChart", open the app, select [facility], and log in with your MyChart username and password.  Due to Covid, a mask is required upon entering the hospital/clinic. If you do not have a mask, one will be given to you upon arrival. For doctor visits, patients may have 1 support person aged 18 or older with them. For treatment visits, patients cannot have anyone with them due to current Covid guidelines and our immunocompromised population.   

## 2021-10-22 NOTE — Assessment & Plan Note (Signed)
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%, HER2 negative Right breast: 0.4 cm mass at 9:30 position: Benign on biopsy  Treatment plan: 1.Neoadjuvant chemotherapy with Adriamycin and Cytoxan along with pembrolizumab followed by Taxol carboplatin and pembrolizumab (pembrolizumab maintenance for 1 year) 2.mastectomy withaxillary lymph nodedissection 3.Adjuvant radiation 4.Followed by adjuvant antiestrogen therapy (since she is 10% ER positive) ------------------------------------------------------------------------------------------------------------------------------------------------------ 05/20/2021 echocardiogram EF 60 to 65% 05/20/2021: CT CAP: Large left breast mass with deep involvement of left pectoralis muscle and overlying skin thickening, mild left axillary adenopathy. No metastatic disease elsewhere 05/21/2021: Bone scan: No bone metastases 05/28/2021: Breast MRI: Large mixed cystic and solid enhancing mass involving outer left breast 10 x 7.3 x 8.5 cm posterior involving pectoralis muscle extends to the nipple mass and enhancement together 15 cm. Left axillary lymphadenopathy. ------------------------------------------------------------------------------------------------------- Current Treatment;completed 4 cycles ofAdriamycin, Cytoxan and Pembrolizumab, Today is cycle8[name] Keytrudaevery 3 weeks)  Chemo Toxicities: 1.Fatigue2 to 3 days after chemo 2.Chemotherapy-induced anemia: Hemoglobin today is7.7 since she is asymptomatic we are monitoring for now. 3.Taste appears to go away for a week after treatment. 4.Leukopenia: Patient will need Granix prior to each treatment.  Monitoring closely for toxicities. 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  Return to clinicin 1 week for Taxol again.

## 2021-10-23 ENCOUNTER — Telehealth: Payer: Self-pay | Admitting: Hematology and Oncology

## 2021-10-23 LAB — T4: T4, Total: 11 ug/dL (ref 4.5–12.0)

## 2021-10-23 NOTE — Telephone Encounter (Signed)
Scheduled appointment per 10/27 los. Left message.

## 2021-10-27 ENCOUNTER — Inpatient Hospital Stay: Payer: 59 | Attending: Hematology and Oncology

## 2021-10-27 ENCOUNTER — Other Ambulatory Visit: Payer: Self-pay

## 2021-10-27 VITALS — BP 124/78 | HR 105 | Temp 98.3°F | Resp 18

## 2021-10-27 DIAGNOSIS — Z5112 Encounter for antineoplastic immunotherapy: Secondary | ICD-10-CM | POA: Diagnosis present

## 2021-10-27 DIAGNOSIS — Z5189 Encounter for other specified aftercare: Secondary | ICD-10-CM | POA: Diagnosis not present

## 2021-10-27 DIAGNOSIS — C50412 Malignant neoplasm of upper-outer quadrant of left female breast: Secondary | ICD-10-CM | POA: Diagnosis present

## 2021-10-27 DIAGNOSIS — Z170 Estrogen receptor positive status [ER+]: Secondary | ICD-10-CM | POA: Insufficient documentation

## 2021-10-27 DIAGNOSIS — Z5111 Encounter for antineoplastic chemotherapy: Secondary | ICD-10-CM | POA: Diagnosis not present

## 2021-10-27 DIAGNOSIS — Z95828 Presence of other vascular implants and grafts: Secondary | ICD-10-CM

## 2021-10-27 MED ORDER — FILGRASTIM-AAFI 480 MCG/0.8ML IJ SOSY
480.0000 ug | PREFILLED_SYRINGE | Freq: Once | INTRAMUSCULAR | Status: AC
  Administered 2021-10-27: 480 ug via SUBCUTANEOUS
  Filled 2021-10-27: qty 0.8

## 2021-10-27 NOTE — Patient Instructions (Signed)
Filgrastim, G-CSF injection What is this medication? FILGRASTIM, G-CSF (fil GRA stim) is a granulocyte colony-stimulating factor that stimulates the growth of neutrophils, a type of white blood cell (WBC) important in the body's fight against infection. It is used to reduce the incidence of fever and infection in patients with certain types of cancer who are receiving chemotherapy that affects the bone marrow, to stimulate blood cell production for removal of WBCs from the body prior to a bone marrow transplantation, to reduce the incidence of fever and infection in patients who have severe chronic neutropenia, and to improve survival outcomes following high-dose radiation exposure that is toxic to the bone marrow. This medicine may be used for other purposes; ask your health care provider or pharmacist if you have questions. COMMON BRAND NAME(S): Neupogen, Nivestym, Releuko, Zarxio What should I tell my care team before I take this medication? They need to know if you have any of these conditions: kidney disease latex allergy ongoing radiation therapy sickle cell disease an unusual or allergic reaction to filgrastim, pegfilgrastim, other medicines, foods, dyes, or preservatives pregnant or trying to get pregnant breast-feeding How should I use this medication? This medicine is for injection under the skin or infusion into a vein. As an infusion into a vein, it is usually given by a health care professional in a hospital or clinic setting. If you get this medicine at home, you will be taught how to prepare and give this medicine. Refer to the Instructions for Use that come with your medication packaging. Use exactly as directed. Take your medicine at regular intervals. Do not take your medicine more often than directed. It is important that you put your used needles and syringes in a special sharps container. Do not put them in a trash can. If you do not have a sharps container, call your pharmacist  or healthcare provider to get one. Talk to your pediatrician regarding the use of this medicine in children. While this drug may be prescribed for children as young as 7 months for selected conditions, precautions do apply. Overdosage: If you think you have taken too much of this medicine contact a poison control center or emergency room at once. NOTE: This medicine is only for you. Do not share this medicine with others. What if I miss a dose? It is important not to miss your dose. Call your doctor or health care professional if you miss a dose. What may interact with this medication? This medicine may interact with the following medications: medicines that may cause a release of neutrophils, such as lithium This list may not describe all possible interactions. Give your health care provider a list of all the medicines, herbs, non-prescription drugs, or dietary supplements you use. Also tell them if you smoke, drink alcohol, or use illegal drugs. Some items may interact with your medicine. What should I watch for while using this medication? Your condition will be monitored carefully while you are receiving this medicine. You may need blood work done while you are taking this medicine. Talk to your health care provider about your risk of cancer. You may be more at risk for certain types of cancer if you take this medicine. What side effects may I notice from receiving this medication? Side effects that you should report to your doctor or health care professional as soon as possible: allergic reactions like skin rash, itching or hives, swelling of the face, lips, or tongue back pain dizziness or feeling faint fever pain, redness, or   irritation at site where injected pinpoint red spots on the skin shortness of breath or breathing problems signs and symptoms of kidney injury like trouble passing urine, change in the amount of urine, or red or dark-brown urine stomach or side pain, or pain at  the shoulder swelling tiredness unusual bleeding or bruising Side effects that usually do not require medical attention (report to your doctor or health care professional if they continue or are bothersome): bone pain cough diarrhea hair loss headache muscle pain This list may not describe all possible side effects. Call your doctor for medical advice about side effects. You may report side effects to FDA at 1-800-FDA-1088. Where should I keep my medication? Keep out of the reach of children. Store in a refrigerator between 2 and 8 degrees C (36 and 46 degrees F). Do not freeze. Keep in carton to protect from light. Throw away this medicine if vials or syringes are left out of the refrigerator for more than 24 hours. Throw away any unused medicine after the expiration date. NOTE: This sheet is a summary. It may not cover all possible information. If you have questions about this medicine, talk to your doctor, pharmacist, or health care provider.  2022 Elsevier/Gold Standard (2020-01-03 18:47:55)  

## 2021-10-28 MED FILL — Dexamethasone Sodium Phosphate Inj 100 MG/10ML: INTRAMUSCULAR | Qty: 1 | Status: AC

## 2021-10-29 ENCOUNTER — Inpatient Hospital Stay: Payer: 59

## 2021-10-29 ENCOUNTER — Other Ambulatory Visit: Payer: Self-pay

## 2021-10-29 ENCOUNTER — Encounter: Payer: Self-pay | Admitting: *Deleted

## 2021-10-29 VITALS — BP 144/92 | HR 114 | Temp 98.3°F | Resp 18 | Ht 64.0 in | Wt 155.8 lb

## 2021-10-29 DIAGNOSIS — Z95828 Presence of other vascular implants and grafts: Secondary | ICD-10-CM

## 2021-10-29 DIAGNOSIS — Z170 Estrogen receptor positive status [ER+]: Secondary | ICD-10-CM

## 2021-10-29 DIAGNOSIS — C50412 Malignant neoplasm of upper-outer quadrant of left female breast: Secondary | ICD-10-CM

## 2021-10-29 DIAGNOSIS — Z5111 Encounter for antineoplastic chemotherapy: Secondary | ICD-10-CM | POA: Diagnosis not present

## 2021-10-29 LAB — CMP (CANCER CENTER ONLY)
ALT: 22 U/L (ref 0–44)
AST: 22 U/L (ref 15–41)
Albumin: 3.6 g/dL (ref 3.5–5.0)
Alkaline Phosphatase: 86 U/L (ref 38–126)
Anion gap: 8 (ref 5–15)
BUN: 10 mg/dL (ref 6–20)
CO2: 27 mmol/L (ref 22–32)
Calcium: 9 mg/dL (ref 8.9–10.3)
Chloride: 105 mmol/L (ref 98–111)
Creatinine: 0.71 mg/dL (ref 0.44–1.00)
GFR, Estimated: >60 mL/min (ref >60)
Glucose, Bld: 105 mg/dL — ABNORMAL HIGH (ref 70–99)
Potassium: 4 mmol/L (ref 3.5–5.1)
Sodium: 140 mmol/L (ref 135–145)
Total Bilirubin: 0.3 mg/dL (ref 0.3–1.2)
Total Protein: 6.7 g/dL (ref 6.5–8.1)

## 2021-10-29 LAB — CBC WITH DIFFERENTIAL (CANCER CENTER ONLY)
Abs Immature Granulocytes: 0.08 10*3/uL — ABNORMAL HIGH (ref 0.00–0.07)
Basophils Absolute: 0 10*3/uL (ref 0.0–0.1)
Basophils Relative: 0 %
Eosinophils Absolute: 0 10*3/uL (ref 0.0–0.5)
Eosinophils Relative: 0 %
HCT: 24.9 % — ABNORMAL LOW (ref 36.0–46.0)
Hemoglobin: 8.5 g/dL — ABNORMAL LOW (ref 12.0–15.0)
Immature Granulocytes: 1 %
Lymphocytes Relative: 11 %
Lymphs Abs: 0.9 10*3/uL (ref 0.7–4.0)
MCH: 32.6 pg (ref 26.0–34.0)
MCHC: 34.1 g/dL (ref 30.0–36.0)
MCV: 95.4 fL (ref 80.0–100.0)
Monocytes Absolute: 0.6 10*3/uL (ref 0.1–1.0)
Monocytes Relative: 7 %
Neutro Abs: 6.7 10*3/uL (ref 1.7–7.7)
Neutrophils Relative %: 81 %
Platelet Count: 295 10*3/uL (ref 150–400)
RBC: 2.61 MIL/uL — ABNORMAL LOW (ref 3.87–5.11)
RDW: 15.2 % (ref 11.5–15.5)
WBC Count: 8.3 10*3/uL (ref 4.0–10.5)
nRBC: 0 % (ref 0.0–0.2)

## 2021-10-29 LAB — TSH: TSH: 2.277 u[IU]/mL (ref 0.308–3.960)

## 2021-10-29 MED ORDER — HEPARIN SOD (PORK) LOCK FLUSH 100 UNIT/ML IV SOLN
500.0000 [IU] | Freq: Once | INTRAVENOUS | Status: AC | PRN
  Administered 2021-10-29: 500 [IU]

## 2021-10-29 MED ORDER — DIPHENHYDRAMINE HCL 50 MG/ML IJ SOLN
50.0000 mg | Freq: Once | INTRAMUSCULAR | Status: AC
  Administered 2021-10-29: 50 mg via INTRAVENOUS
  Filled 2021-10-29: qty 1

## 2021-10-29 MED ORDER — SODIUM CHLORIDE 0.9% FLUSH
10.0000 mL | Freq: Once | INTRAVENOUS | Status: AC
  Administered 2021-10-29: 10 mL

## 2021-10-29 MED ORDER — SODIUM CHLORIDE 0.9% FLUSH
10.0000 mL | INTRAVENOUS | Status: DC | PRN
  Administered 2021-10-29: 10 mL

## 2021-10-29 MED ORDER — SODIUM CHLORIDE 0.9 % IV SOLN
Freq: Once | INTRAVENOUS | Status: AC
Start: 2021-10-29 — End: 2021-10-29

## 2021-10-29 MED ORDER — SODIUM CHLORIDE 0.9 % IV SOLN: Freq: Once | INTRAVENOUS | Status: AC

## 2021-10-29 MED ORDER — SODIUM CHLORIDE 0.9 % IV SOLN
10.0000 mg | Freq: Once | INTRAVENOUS | Status: AC
  Administered 2021-10-29: 10 mg via INTRAVENOUS
  Filled 2021-10-29: qty 10
  Filled 2021-10-29: qty 1

## 2021-10-29 MED ORDER — FAMOTIDINE 20 MG IN NS 100 ML IVPB
20.0000 mg | Freq: Once | INTRAVENOUS | Status: AC
  Administered 2021-10-29: 20 mg via INTRAVENOUS
  Filled 2021-10-29: qty 100

## 2021-10-29 MED ORDER — SODIUM CHLORIDE 0.9 % IV SOLN
60.0000 mg/m2 | Freq: Once | INTRAVENOUS | Status: AC
  Administered 2021-10-29: 102 mg via INTRAVENOUS
  Filled 2021-10-29: qty 17

## 2021-10-29 NOTE — Progress Notes (Signed)
500 mL NS over 1 hr per MD d/t tachycardia/dehydration.

## 2021-10-29 NOTE — Progress Notes (Signed)
OK to trt w/ elevated HR per Dr. Jana Hakim

## 2021-10-29 NOTE — Patient Instructions (Addendum)
[facility]   Discharge Instructions: Thank you for choosing [facility] to provide your oncology and hematology care.   If you have a lab appointment with the [facility], please go directly to the [facility] and check in at the registration area.   Wear comfortable clothing and clothing appropriate for easy access to any Portacath or PICC line.   We strive to give you quality time with your provider. You may need to reschedule your appointment if you arrive late (15 or more minutes).  Arriving late affects you and other patients whose appointments are after yours.  Also, if you miss three or more appointments without notifying the office, you may be dismissed from the clinic at the provider's discretion.      For prescription refill requests, have your pharmacy contact our office and allow 72 hours for refills to be completed.    Today you received the following chemotherapy and/or immunotherapy agents: paclitaxel   To help prevent nausea and vomiting after your treatment, we encourage you to take your nausea medication as directed.  BELOW ARE SYMPTOMS THAT SHOULD BE REPORTED IMMEDIATELY: *FEVER GREATER THAN 100.4 F (38 C) OR HIGHER *CHILLS OR SWEATING *NAUSEA AND VOMITING THAT IS NOT CONTROLLED WITH YOUR NAUSEA MEDICATION *UNUSUAL SHORTNESS OF BREATH *UNUSUAL BRUISING OR BLEEDING *URINARY PROBLEMS (pain or burning when urinating, or frequent urination) *BOWEL PROBLEMS (unusual diarrhea, constipation, pain near the anus) TENDERNESS IN MOUTH AND THROAT WITH OR WITHOUT PRESENCE OF ULCERS (sore throat, sores in mouth, or a toothache) UNUSUAL RASH, SWELLING OR PAIN  UNUSUAL VAGINAL DISCHARGE OR ITCHING   Items with * indicate a potential emergency and should be followed up as soon as possible or go to the Emergency Department if any problems should occur.  Please show the CHEMOTHERAPY ALERT CARD or IMMUNOTHERAPY ALERT CARD at check-in to  the Emergency Department and triage nurse.  Should you have questions after your visit or need to cancel or reschedule your appointment, please contact [facility]  Dept: [phone]  and follow the prompts.  Office hours are 8:00 a.m. to 4:30 p.m. Monday - Friday. Please note that voicemails left after 4:00 p.m. may not be returned until the following business day.  We are closed weekends and major holidays. You have access to a nurse at all times for urgent questions. Please call the main number to the clinic Dept: [phone] and follow the prompts.   For any non-urgent questions, you may also contact your provider using MyChart. We now offer e-Visits for anyone 68 and older to request care online for non-urgent symptoms. For details visit [email or web address].   Also download the MyChart app! Go to the app store, search "MyChart", open the app, select [facility], and log in with your MyChart username and password.  Due to Covid, a mask is required upon entering the hospital/clinic. If you do not have a mask, one will be given to you upon arrival. For doctor visits, patients may have 1 support person aged 43 or older with them. For treatment visits, patients cannot have anyone with them due to current Covid guidelines and our immunocompromised population.   Rehydration, Adult Rehydration is the replacement of body fluids, salts, and minerals (electrolytes) that are lost during dehydration. Dehydration is when there is not enough water or other fluids in the body. This happens when you lose more fluids than you take in. Common causes of dehydration include: Not drinking enough  fluids. This can occur when you are ill or doing activities that require a lot of energy, especially in hot weather. Conditions that cause loss of water or other fluids, such as diarrhea, vomiting, sweating, or urinating a lot. Other illnesses, such as fever or infection. Certain medicines,  such as those that remove excess fluid from the body (diuretics). Symptoms of mild or moderate dehydration may include thirst, dry lips and mouth, and dizziness. Symptoms of severe dehydration may include increased heart rate, confusion, fainting, and not urinating. For severe dehydration, you may need to get fluids through an IV at the hospital. For mild or moderate dehydration, you can usually rehydrate at home by drinking certain fluids as told by your health care provider. What are the risks? Generally, rehydration is safe. However, taking in too much fluid (overhydration) can be a problem. This is rare. Overhydration can cause an electrolyte imbalance, kidney failure, or a decrease in salt (sodium) levels in the body. Supplies needed You will need an oral rehydration solution (ORS) if your health care provider tells you to use one. This is a drink to treat dehydration. It can be found in pharmacies and retail stores. How to rehydrate Fluids Follow instructions from your health care provider for rehydration. The kind of fluid and the amount you should drink depend on your condition. In general, you should choose drinks that you prefer. If told by your health care provider, drink an ORS. Make an ORS by following instructions on the package. Start by drinking small amounts, about  cup (120 mL) every 5-10 minutes. Slowly increase how much you drink until you have taken the amount recommended by your health care provider. Drink enough clear fluids to keep your urine pale yellow. If you were told to drink an ORS, finish it first, then start slowly drinking other clear fluids. Drink fluids such as: Water. This includes sparkling water and flavored water. Drinking only water can lead to having too little sodium in your body (hyponatremia). Follow the advice of your health care provider. Water from ice chips you suck on. Fruit juice with water you add to it (diluted). Sports drinks. Hot or cold  herbal teas. Broth-based soups. Milk or milk products. Food Follow instructions from your health care provider about what to eat while you rehydrate. Your health care provider may recommend that you slowly begin eating regular foods in small amounts. Eat foods that contain a healthy balance of electrolytes, such as bananas, oranges, potatoes, tomatoes, and spinach. Avoid foods that are greasy or contain a lot of sugar. In some cases, you may get nutrition through a feeding tube that is passed through your nose and into your stomach (nasogastric tube, or NG tube). This may be done if you have uncontrolled vomiting or diarrhea. Beverages to avoid Certain beverages may make dehydration worse. While you rehydrate, avoid drinking alcohol. How to tell if you are recovering from dehydration You may be recovering from dehydration if: You are urinating more often than before you started rehydrating. Your urine is pale yellow. Your energy level improves. You vomit less frequently. You have diarrhea less frequently. Your appetite improves or returns to normal. You feel less dizzy or less light-headed. Your skin tone and color start to look more normal. Follow these instructions at home: Take over-the-counter and prescription medicines only as told by your health care provider. Do not take sodium tablets. Doing this can lead to having too much sodium in your body (hypernatremia). Contact a health care provider  if: You continue to have symptoms of mild or moderate dehydration, such as: Thirst. Dry lips. Slightly dry mouth. Dizziness. Dark urine or less urine than normal. Muscle cramps. You continue to vomit or have diarrhea. Get help right away if you: Have symptoms of dehydration that get worse. Have a fever. Have a severe headache. Have been vomiting and the following happens: Your vomiting gets worse or does not go away. Your vomit includes blood or green matter (bile). You cannot eat or  drink without vomiting. Have problems with urination or bowel movements, such as: Diarrhea that gets worse or does not go away. Blood in your stool (feces). This may cause stool to look black and tarry. Not urinating, or urinating only a small amount of very dark urine, within 6-8 hours. Have trouble breathing. Have symptoms that get worse with treatment. These symptoms may represent a serious problem that is an emergency. Do not wait to see if the symptoms will go away. Get medical help right away. Call your local emergency services (911 in the [place] Do not drive yourself to the hospital. Summary Rehydration is the replacement of body fluids and minerals (electrolytes) that are lost during dehydration. Follow instructions from your health care provider for rehydration. The kind of fluid and amount you should drink depend on your condition. Slowly increase how much you drink until you have taken the amount recommended by your health care provider. Contact your health care provider if you continue to show signs of mild or moderate dehydration. This information is not intended to replace advice given to you by your health care provider. Make sure you discuss any questions you have with your health care provider. Document Revised: 02/13/2020 Document Reviewed: 12/24/2019 Elsevier Patient Education  2022 [employer].

## 2021-10-30 LAB — T4: T4, Total: 10.2 ug/dL (ref 4.5–12.0)

## 2021-11-02 ENCOUNTER — Other Ambulatory Visit: Payer: Self-pay

## 2021-11-02 ENCOUNTER — Inpatient Hospital Stay: Payer: 59

## 2021-11-02 VITALS — BP 127/77 | HR 97 | Temp 98.2°F | Resp 18

## 2021-11-02 DIAGNOSIS — Z5111 Encounter for antineoplastic chemotherapy: Secondary | ICD-10-CM | POA: Diagnosis not present

## 2021-11-02 DIAGNOSIS — C50412 Malignant neoplasm of upper-outer quadrant of left female breast: Secondary | ICD-10-CM

## 2021-11-02 MED ORDER — FILGRASTIM-AAFI 480 MCG/0.8ML IJ SOSY
480.0000 ug | PREFILLED_SYRINGE | Freq: Once | INTRAMUSCULAR | Status: AC
  Administered 2021-11-02: 480 ug via SUBCUTANEOUS
  Filled 2021-11-02: qty 0.8

## 2021-11-02 NOTE — Patient Instructions (Signed)
Filgrastim, G-CSF injection °What is this medication? °FILGRASTIM, G-CSF (fil GRA stim) is a granulocyte colony-stimulating factor that stimulates the growth of neutrophils, a type of white blood cell (WBC) important in the body's fight against infection. It is used to reduce the incidence of fever and infection in patients with certain types of cancer who are receiving chemotherapy that affects the bone marrow, to stimulate blood cell production for removal of WBCs from the body prior to a bone marrow transplantation, to reduce the incidence of fever and infection in patients who have severe chronic neutropenia, and to improve survival outcomes following high-dose radiation exposure that is toxic to the bone marrow. °This medicine may be used for other purposes; ask your health care provider or pharmacist if you have questions. °COMMON BRAND NAME(S): Neupogen, Nivestym, Releuko, Zarxio °What should I tell my care team before I take this medication? °They need to know if you have any of these conditions: °kidney disease °latex allergy °ongoing radiation therapy °sickle cell disease °an unusual or allergic reaction to filgrastim, pegfilgrastim, other medicines, foods, dyes, or preservatives °pregnant or trying to get pregnant °breast-feeding °How should I use this medication? °This medicine is for injection under the skin or infusion into a vein. As an infusion into a vein, it is usually given by a health care professional in a hospital or clinic setting. If you get this medicine at home, you will be taught how to prepare and give this medicine. Refer to the Instructions for Use that come with your medication packaging. Use exactly as directed. Take your medicine at regular intervals. Do not take your medicine more often than directed. °It is important that you put your used needles and syringes in a special sharps container. Do not put them in a trash can. If you do not have a sharps container, call your pharmacist  or healthcare provider to get one. °Talk to your pediatrician regarding the use of this medicine in children. While this drug may be prescribed for children as young as 7 months for selected conditions, precautions do apply. °Overdosage: If you think you have taken too much of this medicine contact a poison control center or emergency room at once. °NOTE: This medicine is only for you. Do not share this medicine with others. °What if I miss a dose? °It is important not to miss your dose. Call your doctor or health care professional if you miss a dose. °What may interact with this medication? °This medicine may interact with the following medications: °medicines that may cause a release of neutrophils, such as lithium °This list may not describe all possible interactions. Give your health care provider a list of all the medicines, herbs, non-prescription drugs, or dietary supplements you use. Also tell them if you smoke, drink alcohol, or use illegal drugs. Some items may interact with your medicine. °What should I watch for while using this medication? °Your condition will be monitored carefully while you are receiving this medicine. °You may need blood work done while you are taking this medicine. °Talk to your health care provider about your risk of cancer. You may be more at risk for certain types of cancer if you take this medicine. °What side effects may I notice from receiving this medication? °Side effects that you should report to your doctor or health care professional as soon as possible: °allergic reactions like skin rash, itching or hives, swelling of the face, lips, or tongue °back pain °dizziness or feeling faint °fever °pain, redness, or   irritation at site where injected °pinpoint red spots on the skin °shortness of breath or breathing problems °signs and symptoms of kidney injury like trouble passing urine, change in the amount of urine, or red or dark-brown urine °stomach or side pain, or pain at  the shoulder °swelling °tiredness °unusual bleeding or bruising °Side effects that usually do not require medical attention (report to your doctor or health care professional if they continue or are bothersome): °bone pain °cough °diarrhea °hair loss °headache °muscle pain °This list may not describe all possible side effects. Call your doctor for medical advice about side effects. You may report side effects to FDA at 1-800-FDA-1088. °Where should I keep my medication? °Keep out of the reach of children. °Store in a refrigerator between 2 and 8 degrees C (36 and 46 degrees F). Do not freeze. Keep in carton to protect from light. Throw away this medicine if vials or syringes are left out of the refrigerator for more than 24 hours. Throw away any unused medicine after the expiration date. °NOTE: This sheet is a summary. It may not cover all possible information. If you have questions about this medicine, talk to your doctor, pharmacist, or health care provider. °© 2022 Elsevier/[employer] (2021-09-01 00:00:00) ° °

## 2021-11-03 ENCOUNTER — Inpatient Hospital Stay: Payer: 59

## 2021-11-03 VITALS — BP 117/81 | HR 100 | Temp 98.5°F | Resp 18

## 2021-11-03 DIAGNOSIS — C50412 Malignant neoplasm of upper-outer quadrant of left female breast: Secondary | ICD-10-CM

## 2021-11-03 DIAGNOSIS — Z5111 Encounter for antineoplastic chemotherapy: Secondary | ICD-10-CM | POA: Diagnosis not present

## 2021-11-03 DIAGNOSIS — Z170 Estrogen receptor positive status [ER+]: Secondary | ICD-10-CM

## 2021-11-03 MED ORDER — FILGRASTIM-AAFI 480 MCG/0.8ML IJ SOSY
480.0000 ug | PREFILLED_SYRINGE | Freq: Once | INTRAMUSCULAR | Status: AC
  Administered 2021-11-03: 480 ug via SUBCUTANEOUS
  Filled 2021-11-03: qty 0.8

## 2021-11-04 NOTE — Progress Notes (Signed)
Patient Care Team: Mike Craze, DO as PCP - General (Internal Medicine) Mauro Kaufmann, RN as Oncology Nurse Navigator Rockwell Germany, RN as Oncology Nurse Navigator  DIAGNOSIS:    ICD-10-CM   1. Malignant neoplasm of upper-outer quadrant of left breast in female, estrogen receptor positive ([place])  C50.412 MR BREAST BILATERAL W [place] CAD   Z17.0       SUMMARY OF ONCOLOGIC HISTORY: Oncology History  Malignant neoplasm of upper-outer quadrant of left breast in female, estrogen receptor positive ([place])  05/06/2021 Initial Diagnosis   Large circumscribed multicystic mass left breast 9 to 10 cm with multiple left axillary lymph nodes with cortical thickening: biopsy 3 o'clock position: Grade 2 IDC, ER 10%, PR 0%, Ki-67 60%, HER2 negative Right breast: 0.4 cm mass at 9:30 position: Benign on biopsy   05/12/2021 Cancer Staging   Staging form: Breast, AJCC 8th Edition - Clinical stage from 05/12/2021: Stage IIIB (cT3, cN1, cM0, G2, ER-, PR-, HER2-) - Signed by Nicholas Lose, MD on 05/12/2021 Stage prefix: Initial diagnosis Histologic grading system: 3 grade system    05/30/2021 -  Chemotherapy   Patient is on Treatment Plan : BREAST Pembrolizumab + AC q21d x 4 cycles followed by Pembrolizumab + Carboplatin D1 + Paclitaxel D1,8,15 q21d X 4 cycles        CHIEF COMPLIANT: Cycle 10 Taxol  INTERVAL HISTORY: Sheena Mcdaniel is a 50 y.o. with above-mentioned history of breast cancer currently on neoadjuvant chemotherapy with Taxol carbo along with pembrolizumab. She presents to the clinic today for treatment.  Her major complaints are related to fatigue and taste disturbances.  She had 1 episode of nausea and vomiting after last cycle of chemo.  She is slightly anxious today.  She notes that she is getting to the end of her chemotherapy plan.  ALLERGIES:  has No Known Allergies.  MEDICATIONS:  Current Outpatient Medications  Medication Sig Dispense Refill   lidocaine-prilocaine  (EMLA) cream Apply to affected area once 30 g 3   LORazepam (ATIVAN) 0.5 MG tablet Take 1 tablet (0.5 mg total) by mouth at bedtime as needed for sleep. 30 tablet 0   ondansetron (ZOFRAN) 8 MG tablet Take 1 tablet (8 mg total) by mouth 2 (two) times daily as needed. Start on the third day after carboplatin and AC chemotherapy. 30 tablet 1   prochlorperazine (COMPAZINE) 10 MG tablet Take 1 tablet (10 mg total) by mouth every 6 (six) hours as needed (Nausea or vomiting). 30 tablet 1   traZODone (DESYREL) 50 MG tablet Take 1 tablet (50 mg total) by mouth at bedtime. 30 tablet 1   No current facility-administered medications for this visit.    PHYSICAL EXAMINATION: ECOG PERFORMANCE STATUS: 1 - Symptomatic but completely ambulatory  Vitals:   11/05/21 1001  BP: (!) 147/85  Pulse: (!) 109  Resp: 18  Temp: 97.7 F (36.5 C)  SpO2: 100%   Filed Weights   11/05/21 1001  Weight: 155 lb 14.4 oz (70.7 kg)    LABORATORY DATA:  I have reviewed the data as listed CMP Latest Ref Rng & Units 10/29/2021 10/22/2021 10/15/2021  Glucose 70 - 99 mg/dL 105(H) 133(H) 99  BUN 6 - 20 mg/dL 10 9 14   Creatinine 0.44 - 1.00 mg/dL 0.71 0.78 0.63  Sodium 135 - 145 mmol/L 140 140 134(L)  Potassium 3.5 - 5.1 mmol/L 4.0 3.8 4.0  Chloride 98 - 111 mmol/L 105 104 100  CO2 22 - 32 mmol/L 27  24 25  Calcium 8.9 - 10.3 mg/dL 9.0 9.4 9.2  Total Protein 6.5 - 8.1 g/dL 6.7 7.0 7.1  Total Bilirubin 0.3 - 1.2 mg/dL 0.3 0.3 0.6  Alkaline Phos 38 - 126 U/L 86 86 71  AST 15 - 41 U/L 22 21 23   ALT 0 - 44 U/L 22 18 25     Lab Results  Component Value Date   WBC 8.9 11/05/2021   HGB 8.8 (L) 11/05/2021   HCT 26.3 (L) 11/05/2021   MCV 97.4 11/05/2021   PLT 325 11/05/2021   NEUTROABS PENDING 11/05/2021    ASSESSMENT & PLAN:  Malignant neoplasm of upper-outer quadrant of left breast in female, estrogen receptor positive ([place]) 05/06/2021: Large circumscribed multicystic mass left breast 9 to 10 cm with multiple left  axillary lymph nodes with cortical thickening: biopsy 3 o'clock position: Grade 2 IDC, 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  Neoadjuvant chemotherapy with Adriamycin and Cytoxan along with pembrolizumab followed by Taxol carboplatin and pembrolizumab (pembrolizumab maintenance for 1 year) 2. mastectomy with axillary lymph node dissection 3.  Adjuvant radiation 4.  Followed by adjuvant antiestrogen therapy (since she is 10% ER positive) ------------------------------------------------------------------------------------------------------------------------------------------------------ 05/20/2021 echocardiogram EF 60 to 65% 05/20/2021: CT CAP: Large left breast mass with deep involvement of left pectoralis muscle and overlying skin thickening, mild left axillary adenopathy.  No metastatic disease elsewhere 05/21/2021: Bone scan: No bone metastases 05/28/2021: Breast MRI: Large mixed cystic and solid enhancing mass involving outer left breast 10 x 7.3 x 8.5 cm posterior involving pectoralis muscle extends to the nipple mass and enhancement together 15 cm.  Left axillary lymphadenopathy. ------------------------------------------------------------------------------------------------------- Current Treatment; completed 4 cycles of Adriamycin, Cytoxan and Pembrolizumab, Today is cycle 10 Taxol Sheena Mcdaniel Keytruda every 3 weeks)    Chemo Toxicities:  1. Fatigue 2 to 3 days after chemo 2. Chemotherapy-induced anemia: Hemoglobin today is 8.9   3. Taste appears to go away for a week after treatment. 4.  Leukopenia: Patient will need Granix prior to each treatment.    Monitoring closely for toxicities. 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   Return to clinic weekly for Taxol chemo We will order breast MRI for 11/23/2021. I will see her on 11/24/2021 to go over the  results.    Orders Placed This Encounter  Procedures   MR BREAST BILATERAL W WO CONTRAST INC CAD    Standing Status:   Future    Standing Expiration Date:   11/05/2022    Order Specific Question:   If indicated for the ordered procedure, I authorize the administration of contrast media per Radiology protocol    Answer:   Yes    Order Specific Question:   What is the patient's sedation requirement?    Answer:   No Sedation    Order Specific Question:   Does the patient have a pacemaker or implanted devices?    Answer:   No    Order Specific Question:   Preferred imaging location?    Answer:   [facility] (table limit-550lbs)    Order Specific Question:   Release to patient    Answer:   Immediate    The patient has a good understanding of the overall plan. she agrees with it. she will call with any problems that may develop before the next visit here.  Total time  spent: 30 mins including face to face time and time spent for planning, charting and coordination of care  Rulon Eisenmenger, MD, MPH 11/05/2021  I, Thana Ates, am acting as scribe for Dr. Nicholas Lose.  I have reviewed the above documentation for accuracy and completeness, and I agree with the above.

## 2021-11-05 ENCOUNTER — Inpatient Hospital Stay (HOSPITAL_BASED_OUTPATIENT_CLINIC_OR_DEPARTMENT_OTHER): Payer: 59 | Admitting: Hematology and Oncology

## 2021-11-05 ENCOUNTER — Telehealth: Payer: Self-pay | Admitting: Hematology and Oncology

## 2021-11-05 ENCOUNTER — Inpatient Hospital Stay: Payer: 59

## 2021-11-05 ENCOUNTER — Encounter: Payer: Self-pay | Admitting: *Deleted

## 2021-11-05 ENCOUNTER — Other Ambulatory Visit: Payer: Self-pay

## 2021-11-05 VITALS — HR 95

## 2021-11-05 DIAGNOSIS — C50412 Malignant neoplasm of upper-outer quadrant of left female breast: Secondary | ICD-10-CM | POA: Diagnosis not present

## 2021-11-05 DIAGNOSIS — Z5111 Encounter for antineoplastic chemotherapy: Secondary | ICD-10-CM | POA: Diagnosis not present

## 2021-11-05 DIAGNOSIS — Z170 Estrogen receptor positive status [ER+]: Secondary | ICD-10-CM | POA: Diagnosis not present

## 2021-11-05 DIAGNOSIS — Z95828 Presence of other vascular implants and grafts: Secondary | ICD-10-CM

## 2021-11-05 LAB — CBC WITH DIFFERENTIAL (CANCER CENTER ONLY)
Abs Immature Granulocytes: 0.16 10*3/uL — ABNORMAL HIGH (ref 0.00–0.07)
Basophils Absolute: 0 10*3/uL (ref 0.0–0.1)
Basophils Relative: 0 %
Eosinophils Absolute: 0.1 10*3/uL (ref 0.0–0.5)
Eosinophils Relative: 1 %
HCT: 26.3 % — ABNORMAL LOW (ref 36.0–46.0)
Hemoglobin: 8.8 g/dL — ABNORMAL LOW (ref 12.0–15.0)
Immature Granulocytes: 2 %
Lymphocytes Relative: 12 %
Lymphs Abs: 1 10*3/uL (ref 0.7–4.0)
MCH: 32.6 pg (ref 26.0–34.0)
MCHC: 33.5 g/dL (ref 30.0–36.0)
MCV: 97.4 fL (ref 80.0–100.0)
Monocytes Absolute: 0.7 10*3/uL (ref 0.1–1.0)
Monocytes Relative: 8 %
Neutro Abs: 6.9 10*3/uL (ref 1.7–7.7)
Neutrophils Relative %: 77 %
Platelet Count: 325 10*3/uL (ref 150–400)
RBC: 2.7 MIL/uL — ABNORMAL LOW (ref 3.87–5.11)
RDW: 16.2 % — ABNORMAL HIGH (ref 11.5–15.5)
Smear Review: NORMAL
WBC Count: 8.9 10*3/uL (ref 4.0–10.5)
nRBC: 0.3 % — ABNORMAL HIGH (ref 0.0–0.2)

## 2021-11-05 LAB — CMP (CANCER CENTER ONLY)
ALT: 25 U/L (ref 0–44)
AST: 22 U/L (ref 15–41)
Albumin: 3.7 g/dL (ref 3.5–5.0)
Alkaline Phosphatase: 84 U/L (ref 38–126)
Anion gap: 10 (ref 5–15)
BUN: 8 mg/dL (ref 6–20)
CO2: 24 mmol/L (ref 22–32)
Calcium: 8.9 mg/dL (ref 8.9–10.3)
Chloride: 106 mmol/L (ref 98–111)
Creatinine: 0.74 mg/dL (ref 0.44–1.00)
GFR, Estimated: >60 mL/min (ref >60)
Glucose, Bld: 140 mg/dL — ABNORMAL HIGH (ref 70–99)
Potassium: 3.7 mmol/L (ref 3.5–5.1)
Sodium: 140 mmol/L (ref 135–145)
Total Bilirubin: 0.3 mg/dL (ref 0.3–1.2)
Total Protein: 6.5 g/dL (ref 6.5–8.1)

## 2021-11-05 LAB — TSH: TSH: 2.619 u[IU]/mL (ref 0.308–3.960)

## 2021-11-05 MED ORDER — FAMOTIDINE 20 MG IN NS 100 ML IVPB
20.0000 mg | Freq: Once | INTRAVENOUS | Status: AC
  Administered 2021-11-05: 20 mg via INTRAVENOUS
  Filled 2021-11-05: qty 100

## 2021-11-05 MED ORDER — SODIUM CHLORIDE 0.9 % IV SOLN
10.0000 mg | Freq: Once | INTRAVENOUS | Status: AC
  Administered 2021-11-05: 10 mg via INTRAVENOUS
  Filled 2021-11-05: qty 10

## 2021-11-05 MED ORDER — SODIUM CHLORIDE 0.9 % IV SOLN
150.0000 mg | Freq: Once | INTRAVENOUS | Status: AC
  Administered 2021-11-05: 150 mg via INTRAVENOUS
  Filled 2021-11-05: qty 150

## 2021-11-05 MED ORDER — SODIUM CHLORIDE 0.9% FLUSH
10.0000 mL | Freq: Once | INTRAVENOUS | Status: AC
  Administered 2021-11-05: 10 mL

## 2021-11-05 MED ORDER — PALONOSETRON HCL INJECTION 0.25 MG/5ML
0.2500 mg | Freq: Once | INTRAVENOUS | Status: AC
  Administered 2021-11-05: 0.25 mg via INTRAVENOUS
  Filled 2021-11-05: qty 5

## 2021-11-05 MED ORDER — HEPARIN SOD (PORK) LOCK FLUSH 100 UNIT/ML IV SOLN
500.0000 [IU] | Freq: Once | INTRAVENOUS | Status: AC | PRN
  Administered 2021-11-05: 500 [IU]

## 2021-11-05 MED ORDER — SODIUM CHLORIDE 0.9 % IV SOLN
60.0000 mg/m2 | Freq: Once | INTRAVENOUS | Status: AC
  Administered 2021-11-05: 102 mg via INTRAVENOUS
  Filled 2021-11-05: qty 17

## 2021-11-05 MED ORDER — SODIUM CHLORIDE 0.9% FLUSH
10.0000 mL | INTRAVENOUS | Status: DC | PRN
  Administered 2021-11-05: 10 mL

## 2021-11-05 MED ORDER — DIPHENHYDRAMINE HCL 50 MG/ML IJ SOLN
50.0000 mg | Freq: Once | INTRAMUSCULAR | Status: AC
Start: 2021-11-05 — End: 2021-11-05
  Administered 2021-11-05: 50 mg via INTRAVENOUS
  Filled 2021-11-05: qty 1

## 2021-11-05 MED ORDER — SODIUM CHLORIDE 0.9 % IV SOLN
600.0000 mg | Freq: Once | INTRAVENOUS | Status: AC
  Administered 2021-11-05: 600 mg via INTRAVENOUS
  Filled 2021-11-05: qty 60

## 2021-11-05 MED ORDER — SODIUM CHLORIDE 0.9 % IV SOLN
200.0000 mg | Freq: Once | INTRAVENOUS | Status: AC
  Administered 2021-11-05: 200 mg via INTRAVENOUS
  Filled 2021-11-05: qty 8

## 2021-11-05 MED ORDER — SODIUM CHLORIDE 0.9 % IV SOLN: Freq: Once | INTRAVENOUS | Status: AC

## 2021-11-05 NOTE — Assessment & Plan Note (Signed)
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%, HER2 negative Right breast: 0.4 cm mass at 9:30 position: Benign on biopsy  Treatment plan: 1.Neoadjuvant chemotherapy with Adriamycin and Cytoxan along with pembrolizumab followed by Taxol carboplatin and pembrolizumab (pembrolizumab maintenance for 1 year) 2.mastectomy withaxillary lymph nodedissection 3.Adjuvant radiation 4.Followed by adjuvant antiestrogen therapy (since she is 10% ER positive) ------------------------------------------------------------------------------------------------------------------------------------------------------ 05/20/2021 echocardiogram EF 60 to 65% 05/20/2021: CT CAP: Large left breast mass with deep involvement of left pectoralis muscle and overlying skin thickening, mild left axillary adenopathy. No metastatic disease elsewhere 05/21/2021: Bone scan: No bone metastases 05/28/2021: Breast MRI: Large mixed cystic and solid enhancing mass involving outer left breast 10 x 7.3 x 8.5 cm posterior involving pectoralis muscle extends to the nipple mass and enhancement together 15 cm. Left axillary lymphadenopathy. ------------------------------------------------------------------------------------------------------- Current Treatment;completed 4 cycles ofAdriamycin, Cytoxan and Pembrolizumab, Today is cycle10[name] Keytrudaevery 3 weeks)  Chemo Toxicities: 1.Fatigue2 to 3 days after chemo 2.Chemotherapy-induced anemia: Hemoglobin today is8.5 since she is asymptomatic we are monitoring for now. 3.Taste appears to go away for a week after treatment. 4.Leukopenia: Patient will need Granix prior to each treatment.  Monitoring closely for toxicities. 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  Return to clinic weekly for Taxol chemo

## 2021-11-05 NOTE — Patient Instructions (Signed)
[facility] MEDICAL [facility]COLOGY   Discharge Instructions: Thank you for choosing [facility] to provide your oncology and hematology care.   If you have a lab appointment with the Cancer Center, please go directly to the Cancer Center and check in at the registration area.   Wear comfortable clothing and clothing appropriate for easy access to any Portacath or PICC line.   We strive to give you quality time with your provider. You may need to reschedule your appointment if you arrive late (15 or more minutes).  Arriving late affects you and other patients whose appointments are after yours.  Also, if you miss three or more appointments without notifying the office, you may be dismissed from the clinic at the provider's discretion.      For prescription refill requests, have your pharmacy contact our office and allow 72 hours for refills to be completed.    Today you received the following chemotherapy and/or immunotherapy agents: Pembrolizumab (Keytruda), Paclitaxel (Taxol), and Carboplatin      To help prevent nausea and vomiting after your treatment, we encourage you to take your nausea medication as directed.  BELOW ARE SYMPTOMS THAT SHOULD BE REPORTED IMMEDIATELY: *FEVER GREATER THAN 100.4 F (38 C) OR HIGHER *CHILLS OR SWEATING *NAUSEA AND VOMITING THAT IS NOT CONTROLLED WITH YOUR NAUSEA MEDICATION *UNUSUAL SHORTNESS OF BREATH *UNUSUAL BRUISING OR BLEEDING *URINARY PROBLEMS (pain or burning when urinating, or frequent urination) *BOWEL PROBLEMS (unusual diarrhea, constipation, pain near the anus) TENDERNESS IN MOUTH AND THROAT WITH OR WITHOUT PRESENCE OF ULCERS (sore throat, sores in mouth, or a toothache) UNUSUAL RASH, SWELLING OR PAIN  UNUSUAL VAGINAL DISCHARGE OR ITCHING   Items with * indicate a potential emergency and should be followed up as soon as possible or go to the Emergency Department if any problems should occur.  Please show the CHEMOTHERAPY  ALERT CARD or IMMUNOTHERAPY ALERT CARD at check-in to the Emergency Department and triage nurse.  Should you have questions after your visit or need to cancel or reschedule your appointment, please contact [facility]  Dept: [phone]  and follow the prompts.  Office hours are 8:00 a.m. to 4:30 p.m. Monday - Friday. Please note that voicemails left after 4:00 p.m. may not be returned until the following business day.  We are closed weekends and major holidays. You have access to a nurse at all times for urgent questions. Please call the main number to the clinic Dept: [phone] and follow the prompts.   For any non-urgent questions, you may also contact your provider using MyChart. We now offer e-Visits for anyone 52 and older to request care online for non-urgent symptoms. For details visit [email or web address].   Also download the MyChart app! Go to the app store, search "MyChart", open the app, select , and log in with your MyChart username and password.  Due to Covid, a mask is required upon entering the hospital/clinic. If you do not have a mask, one will be given to you upon arrival. For doctor visits, patients may have 1 support person aged 52 or older with them. For treatment visits, patients cannot have anyone with them due to current Covid guidelines and our immunocompromised population.   

## 2021-11-05 NOTE — Telephone Encounter (Signed)
Rescheduled appointment per melanie. Patient ware.

## 2021-11-06 LAB — T4: T4, Total: 10.7 ug/dL (ref 4.5–12.0)

## 2021-11-10 ENCOUNTER — Other Ambulatory Visit: Payer: Self-pay

## 2021-11-10 ENCOUNTER — Inpatient Hospital Stay: Payer: 59

## 2021-11-10 VITALS — BP 115/88 | HR 95 | Temp 98.4°F | Resp 18

## 2021-11-10 DIAGNOSIS — Z5111 Encounter for antineoplastic chemotherapy: Secondary | ICD-10-CM | POA: Diagnosis not present

## 2021-11-10 DIAGNOSIS — Z95828 Presence of other vascular implants and grafts: Secondary | ICD-10-CM

## 2021-11-10 MED ORDER — FILGRASTIM-AAFI 480 MCG/0.8ML IJ SOSY
480.0000 ug | PREFILLED_SYRINGE | Freq: Once | INTRAMUSCULAR | Status: AC
  Administered 2021-11-10: 480 ug via SUBCUTANEOUS
  Filled 2021-11-10: qty 0.8

## 2021-11-12 ENCOUNTER — Inpatient Hospital Stay: Payer: 59

## 2021-11-12 ENCOUNTER — Other Ambulatory Visit: Payer: Self-pay

## 2021-11-12 ENCOUNTER — Encounter: Payer: Self-pay | Admitting: *Deleted

## 2021-11-12 VITALS — BP 121/92 | HR 93 | Temp 98.2°F | Resp 16 | Wt 154.5 lb

## 2021-11-12 DIAGNOSIS — Z5111 Encounter for antineoplastic chemotherapy: Secondary | ICD-10-CM | POA: Diagnosis not present

## 2021-11-12 DIAGNOSIS — C50412 Malignant neoplasm of upper-outer quadrant of left female breast: Secondary | ICD-10-CM

## 2021-11-12 DIAGNOSIS — Z95828 Presence of other vascular implants and grafts: Secondary | ICD-10-CM

## 2021-11-12 LAB — CBC WITH DIFFERENTIAL (CANCER CENTER ONLY)
Abs Immature Granulocytes: 0 10*3/uL (ref 0.00–0.07)
Basophils Absolute: 0 10*3/uL (ref 0.0–0.1)
Basophils Relative: 1 %
Eosinophils Absolute: 0 10*3/uL (ref 0.0–0.5)
Eosinophils Relative: 2 %
HCT: 24.7 % — ABNORMAL LOW (ref 36.0–46.0)
Hemoglobin: 8.4 g/dL — ABNORMAL LOW (ref 12.0–15.0)
Immature Granulocytes: 0 %
Lymphocytes Relative: 41 %
Lymphs Abs: 0.6 10*3/uL — ABNORMAL LOW (ref 0.7–4.0)
MCH: 32.6 pg (ref 26.0–34.0)
MCHC: 34 g/dL (ref 30.0–36.0)
MCV: 95.7 fL (ref 80.0–100.0)
Monocytes Absolute: 0.3 10*3/uL (ref 0.1–1.0)
Monocytes Relative: 22 %
Neutro Abs: 0.5 10*3/uL — ABNORMAL LOW (ref 1.7–7.7)
Neutrophils Relative %: 34 %
Platelet Count: 184 10*3/uL (ref 150–400)
RBC: 2.58 MIL/uL — ABNORMAL LOW (ref 3.87–5.11)
RDW: 14.8 % (ref 11.5–15.5)
Smear Review: NORMAL
WBC Count: 1.4 10*3/uL — ABNORMAL LOW (ref 4.0–10.5)
nRBC: 0 % (ref 0.0–0.2)

## 2021-11-12 LAB — CMP (CANCER CENTER ONLY)
ALT: 20 U/L (ref 0–44)
AST: 19 U/L (ref 15–41)
Albumin: 3.8 g/dL (ref 3.5–5.0)
Alkaline Phosphatase: 74 U/L (ref 38–126)
Anion gap: 8 (ref 5–15)
BUN: 14 mg/dL (ref 6–20)
CO2: 25 mmol/L (ref 22–32)
Calcium: 9 mg/dL (ref 8.9–10.3)
Chloride: 104 mmol/L (ref 98–111)
Creatinine: 0.68 mg/dL (ref 0.44–1.00)
GFR, Estimated: >60 mL/min (ref >60)
Glucose, Bld: 100 mg/dL — ABNORMAL HIGH (ref 70–99)
Potassium: 3.9 mmol/L (ref 3.5–5.1)
Sodium: 137 mmol/L (ref 135–145)
Total Bilirubin: 0.4 mg/dL (ref 0.3–1.2)
Total Protein: 6.6 g/dL (ref 6.5–8.1)

## 2021-11-12 LAB — TSH: TSH: 2.41 u[IU]/mL (ref 0.308–3.960)

## 2021-11-12 MED ORDER — SODIUM CHLORIDE 0.9% FLUSH
10.0000 mL | Freq: Once | INTRAVENOUS | Status: AC
  Administered 2021-11-12: 14:00:00 10 mL

## 2021-11-12 MED ORDER — HEPARIN SOD (PORK) LOCK FLUSH 100 UNIT/ML IV SOLN
500.0000 [IU] | Freq: Once | INTRAVENOUS | Status: AC
  Administered 2021-11-12: 14:00:00 500 [IU]

## 2021-11-12 MED ORDER — FILGRASTIM-AAFI 480 MCG/0.8ML IJ SOSY
480.0000 ug | PREFILLED_SYRINGE | Freq: Once | INTRAMUSCULAR | Status: AC
  Administered 2021-11-12: 13:00:00 480 ug via SUBCUTANEOUS
  Filled 2021-11-12: qty 0.8

## 2021-11-12 NOTE — Progress Notes (Signed)
Pt ANC 0.5- per MD pt tx needing to be held today and pushed out by one week.  Verbal orders also received for pt to receive filgrastim today.  Pt will also receive filgrastim on 11/17/21 prior to tx on 11/20/21.  Infusion RN educated on pt plan and verbalized understanding.  This RN sent high priority message to scheduling to add additional infusion tx to 11/26/21 or 11/27/21 depending on availability.

## 2021-11-12 NOTE — Patient Instructions (Addendum)
Filgrastim, G-CSF injection °What is this medication? °FILGRASTIM, G-CSF (fil GRA stim) is a granulocyte colony-stimulating factor that stimulates the growth of neutrophils, a type of white blood cell (WBC) important in the body's fight against infection. It is used to reduce the incidence of fever and infection in patients with certain types of cancer who are receiving chemotherapy that affects the bone marrow, to stimulate blood cell production for removal of WBCs from the body prior to a bone marrow transplantation, to reduce the incidence of fever and infection in patients who have severe chronic neutropenia, and to improve survival outcomes following high-dose radiation exposure that is toxic to the bone marrow. °This medicine may be used for other purposes; ask your health care provider or pharmacist if you have questions. °COMMON BRAND NAME(S): Neupogen, Nivestym, Releuko, Zarxio °What should I tell my care team before I take this medication? °They need to know if you have any of these conditions: °kidney disease °latex allergy °ongoing radiation therapy °sickle cell disease °an unusual or allergic reaction to filgrastim, pegfilgrastim, other medicines, foods, dyes, or preservatives °pregnant or trying to get pregnant °breast-feeding °How should I use this medication? °This medicine is for injection under the skin or infusion into a vein. As an infusion into a vein, it is usually given by a health care professional in a hospital or clinic setting. If you get this medicine at home, you will be taught how to prepare and give this medicine. Refer to the Instructions for Use that come with your medication packaging. Use exactly as directed. Take your medicine at regular intervals. Do not take your medicine more often than directed. °It is important that you put your used needles and syringes in a special sharps container. Do not put them in a trash can. If you do not have a sharps container, call your pharmacist  or healthcare provider to get one. °Talk to your pediatrician regarding the use of this medicine in children. While this drug may be prescribed for children as young as 7 months for selected conditions, precautions do apply. °Overdosage: If you think you have taken too much of this medicine contact a poison control center or emergency room at once. °NOTE: This medicine is only for you. Do not share this medicine with others. °What if I miss a dose? °It is important not to miss your dose. Call your doctor or health care professional if you miss a dose. °What may interact with this medication? °This medicine may interact with the following medications: °medicines that may cause a release of neutrophils, such as lithium °This list may not describe all possible interactions. Give your health care provider a list of all the medicines, herbs, non-prescription drugs, or dietary supplements you use. Also tell them if you smoke, drink alcohol, or use illegal drugs. Some items may interact with your medicine. °What should I watch for while using this medication? °Your condition will be monitored carefully while you are receiving this medicine. °You may need blood work done while you are taking this medicine. °Talk to your health care provider about your risk of cancer. You may be more at risk for certain types of cancer if you take this medicine. °What side effects may I notice from receiving this medication? °Side effects that you should report to your doctor or health care professional as soon as possible: °allergic reactions like skin rash, itching or hives, swelling of the face, lips, or tongue °back pain °dizziness or feeling faint °fever °pain, redness, or   irritation at site where injected °pinpoint red spots on the skin °shortness of breath or breathing problems °signs and symptoms of kidney injury like trouble passing urine, change in the amount of urine, or red or dark-brown urine °stomach or side pain, or pain at  the shoulder °swelling °tiredness °unusual bleeding or bruising °Side effects that usually do not require medical attention (report to your doctor or health care professional if they continue or are bothersome): °bone pain °cough °diarrhea °hair loss °headache °muscle pain °This list may not describe all possible side effects. Call your doctor for medical advice about side effects. You may report side effects to FDA at 1-800-FDA-1088. °Where should I keep my medication? °Keep out of the reach of children. °Store in a refrigerator between 2 and 8 degrees C (36 and 46 degrees F). Do not freeze. Keep in carton to protect from light. Throw away this medicine if vials or syringes are left out of the refrigerator for more than 24 hours. Throw away any unused medicine after the expiration date. °NOTE: This sheet is a summary. It may not cover all possible information. If you have questions about this medicine, talk to your doctor, pharmacist, or health care provider. °© 2022 [employer] (2021-09-01 00:00:00) ° °

## 2021-11-13 LAB — T4: T4, Total: 9.7 ug/dL (ref 4.5–12.0)

## 2021-11-17 ENCOUNTER — Inpatient Hospital Stay: Payer: 59

## 2021-11-17 ENCOUNTER — Other Ambulatory Visit: Payer: Self-pay

## 2021-11-17 VITALS — BP 125/71 | HR 84 | Temp 98.2°F | Resp 18

## 2021-11-17 DIAGNOSIS — Z95828 Presence of other vascular implants and grafts: Secondary | ICD-10-CM

## 2021-11-17 DIAGNOSIS — Z5111 Encounter for antineoplastic chemotherapy: Secondary | ICD-10-CM | POA: Diagnosis not present

## 2021-11-17 MED ORDER — FILGRASTIM-AAFI 480 MCG/0.8ML IJ SOSY
480.0000 ug | PREFILLED_SYRINGE | Freq: Once | INTRAMUSCULAR | Status: AC
  Administered 2021-11-17: 480 ug via SUBCUTANEOUS
  Filled 2021-11-17: qty 0.8

## 2021-11-17 NOTE — Patient Instructions (Signed)
Filgrastim, G-CSF injection °What is this medication? °FILGRASTIM, G-CSF (fil GRA stim) is a granulocyte colony-stimulating factor that stimulates the growth of neutrophils, a type of white blood cell (WBC) important in the body's fight against infection. It is used to reduce the incidence of fever and infection in patients with certain types of cancer who are receiving chemotherapy that affects the bone marrow, to stimulate blood cell production for removal of WBCs from the body prior to a bone marrow transplantation, to reduce the incidence of fever and infection in patients who have severe chronic neutropenia, and to improve survival outcomes following high-dose radiation exposure that is toxic to the bone marrow. °This medicine may be used for other purposes; ask your health care provider or pharmacist if you have questions. °COMMON BRAND NAME(S): Neupogen, Nivestym, Releuko, Zarxio °What should I tell my care team before I take this medication? °They need to know if you have any of these conditions: °kidney disease °latex allergy °ongoing radiation therapy °sickle cell disease °an unusual or allergic reaction to filgrastim, pegfilgrastim, other medicines, foods, dyes, or preservatives °pregnant or trying to get pregnant °breast-feeding °How should I use this medication? °This medicine is for injection under the skin or infusion into a vein. As an infusion into a vein, it is usually given by a health care professional in a hospital or clinic setting. If you get this medicine at home, you will be taught how to prepare and give this medicine. Refer to the Instructions for Use that come with your medication packaging. Use exactly as directed. Take your medicine at regular intervals. Do not take your medicine more often than directed. °It is important that you put your used needles and syringes in a special sharps container. Do not put them in a trash can. If you do not have a sharps container, call your pharmacist  or healthcare provider to get one. °Talk to your pediatrician regarding the use of this medicine in children. While this drug may be prescribed for children as young as 7 months for selected conditions, precautions do apply. °Overdosage: If you think you have taken too much of this medicine contact a poison control center or emergency room at once. °NOTE: This medicine is only for you. Do not share this medicine with others. °What if I miss a dose? °It is important not to miss your dose. Call your doctor or health care professional if you miss a dose. °What may interact with this medication? °This medicine may interact with the following medications: °medicines that may cause a release of neutrophils, such as lithium °This list may not describe all possible interactions. Give your health care provider a list of all the medicines, herbs, non-prescription drugs, or dietary supplements you use. Also tell them if you smoke, drink alcohol, or use illegal drugs. Some items may interact with your medicine. °What should I watch for while using this medication? °Your condition will be monitored carefully while you are receiving this medicine. °You may need blood work done while you are taking this medicine. °Talk to your health care provider about your risk of cancer. You may be more at risk for certain types of cancer if you take this medicine. °What side effects may I notice from receiving this medication? °Side effects that you should report to your doctor or health care professional as soon as possible: °allergic reactions like skin rash, itching or hives, swelling of the face, lips, or tongue °back pain °dizziness or feeling faint °fever °pain, redness, or   irritation at site where injected °pinpoint red spots on the skin °shortness of breath or breathing problems °signs and symptoms of kidney injury like trouble passing urine, change in the amount of urine, or red or dark-brown urine °stomach or side pain, or pain at  the shoulder °swelling °tiredness °unusual bleeding or bruising °Side effects that usually do not require medical attention (report to your doctor or health care professional if they continue or are bothersome): °bone pain °cough °diarrhea °hair loss °headache °muscle pain °This list may not describe all possible side effects. Call your doctor for medical advice about side effects. You may report side effects to FDA at 1-800-FDA-1088. °Where should I keep my medication? °Keep out of the reach of children. °Store in a refrigerator between 2 and 8 degrees C (36 and 46 degrees F). Do not freeze. Keep in carton to protect from light. Throw away this medicine if vials or syringes are left out of the refrigerator for more than 24 hours. Throw away any unused medicine after the expiration date. °NOTE: This sheet is a summary. It may not cover all possible information. If you have questions about this medicine, talk to your doctor, pharmacist, or health care provider. °© 2022 Elsevier/[employer] (2021-09-01 00:00:00) ° °

## 2021-11-18 MED FILL — Dexamethasone Sodium Phosphate Inj 100 MG/10ML: INTRAMUSCULAR | Qty: 1 | Status: AC

## 2021-11-20 ENCOUNTER — Encounter: Payer: Self-pay | Admitting: *Deleted

## 2021-11-20 ENCOUNTER — Inpatient Hospital Stay: Payer: 59

## 2021-11-20 ENCOUNTER — Other Ambulatory Visit: Payer: Self-pay

## 2021-11-20 VITALS — BP 123/58 | HR 90 | Temp 98.4°F | Resp 16 | Wt 157.0 lb

## 2021-11-20 DIAGNOSIS — C50412 Malignant neoplasm of upper-outer quadrant of left female breast: Secondary | ICD-10-CM

## 2021-11-20 DIAGNOSIS — Z95828 Presence of other vascular implants and grafts: Secondary | ICD-10-CM

## 2021-11-20 DIAGNOSIS — Z5111 Encounter for antineoplastic chemotherapy: Secondary | ICD-10-CM | POA: Diagnosis not present

## 2021-11-20 LAB — CMP (CANCER CENTER ONLY)
ALT: 20 U/L (ref 0–44)
AST: 18 U/L (ref 15–41)
Albumin: 3.6 g/dL (ref 3.5–5.0)
Alkaline Phosphatase: 74 U/L (ref 38–126)
Anion gap: 8 (ref 5–15)
BUN: 10 mg/dL (ref 6–20)
CO2: 26 mmol/L (ref 22–32)
Calcium: 8.9 mg/dL (ref 8.9–10.3)
Chloride: 109 mmol/L (ref 98–111)
Creatinine: 0.69 mg/dL (ref 0.44–1.00)
GFR, Estimated: >60 mL/min (ref >60)
Glucose, Bld: 118 mg/dL — ABNORMAL HIGH (ref 70–99)
Potassium: 4.1 mmol/L (ref 3.5–5.1)
Sodium: 143 mmol/L (ref 135–145)
Total Bilirubin: 0.3 mg/dL (ref 0.3–1.2)
Total Protein: 6.2 g/dL — ABNORMAL LOW (ref 6.5–8.1)

## 2021-11-20 LAB — CBC WITH DIFFERENTIAL (CANCER CENTER ONLY)
Abs Immature Granulocytes: 0.02 10*3/uL (ref 0.00–0.07)
Basophils Absolute: 0 10*3/uL (ref 0.0–0.1)
Basophils Relative: 1 %
Eosinophils Absolute: 0 10*3/uL (ref 0.0–0.5)
Eosinophils Relative: 1 %
HCT: 23.3 % — ABNORMAL LOW (ref 36.0–46.0)
Hemoglobin: 7.8 g/dL — ABNORMAL LOW (ref 12.0–15.0)
Immature Granulocytes: 1 %
Lymphocytes Relative: 16 %
Lymphs Abs: 0.6 10*3/uL — ABNORMAL LOW (ref 0.7–4.0)
MCH: 32.9 pg (ref 26.0–34.0)
MCHC: 33.5 g/dL (ref 30.0–36.0)
MCV: 98.3 fL (ref 80.0–100.0)
Monocytes Absolute: 0.3 10*3/uL (ref 0.1–1.0)
Monocytes Relative: 8 %
Neutro Abs: 3 10*3/uL (ref 1.7–7.7)
Neutrophils Relative %: 73 %
Platelet Count: 96 10*3/uL — ABNORMAL LOW (ref 150–400)
RBC: 2.37 MIL/uL — ABNORMAL LOW (ref 3.87–5.11)
RDW: 16.3 % — ABNORMAL HIGH (ref 11.5–15.5)
WBC Count: 4 10*3/uL (ref 4.0–10.5)
nRBC: 0 % (ref 0.0–0.2)

## 2021-11-20 LAB — TSH: TSH: 2.067 u[IU]/mL (ref 0.308–3.960)

## 2021-11-20 MED ORDER — HEPARIN SOD (PORK) LOCK FLUSH 100 UNIT/ML IV SOLN
500.0000 [IU] | Freq: Once | INTRAVENOUS | Status: AC
Start: 2021-11-20 — End: 2021-11-20
  Administered 2021-11-20: 500 [IU]

## 2021-11-20 MED ORDER — SODIUM CHLORIDE 0.9% FLUSH
10.0000 mL | Freq: Once | INTRAVENOUS | Status: AC
  Administered 2021-11-20: 10 mL

## 2021-11-20 NOTE — Progress Notes (Signed)
Contacted Dr Alvy Bimler in regards to abnormal lab values. Dr Alvy Bimler left decision to hold treatment today up to the patient. The patient decided she would hold off treatment today, let her body rest, and try again next week. RN communicated this decision to Dr Alvy Bimler who states she will relay the message to Dr Lindi Adie.

## 2021-11-21 ENCOUNTER — Other Ambulatory Visit: Payer: 59

## 2021-11-21 LAB — T4: T4, Total: 11 ug/dL (ref 4.5–12.0)

## 2021-11-23 ENCOUNTER — Telehealth: Payer: Self-pay | Admitting: *Deleted

## 2021-11-23 NOTE — Telephone Encounter (Signed)
Received call from pt requesting to possible cancel injection appt tomorrow for Filgrastim.  Pt states chemo was canceled 11/20/21 due to hgb being low.  Pt [place] 11/20/21 3.0.  Per MD pt is not needing Filgrastim this week.  Appt canceled and pt verbalized understanding.

## 2021-11-24 ENCOUNTER — Inpatient Hospital Stay: Payer: 59

## 2021-11-24 ENCOUNTER — Inpatient Hospital Stay: Payer: 59 | Admitting: Hematology and Oncology

## 2021-11-26 ENCOUNTER — Other Ambulatory Visit: Payer: Self-pay | Admitting: *Deleted

## 2021-11-26 DIAGNOSIS — C50412 Malignant neoplasm of upper-outer quadrant of left female breast: Secondary | ICD-10-CM

## 2021-11-26 MED FILL — Dexamethasone Sodium Phosphate Inj 100 MG/10ML: INTRAMUSCULAR | Qty: 1 | Status: AC

## 2021-11-26 NOTE — Assessment & Plan Note (Addendum)
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%, HER2 negative Right breast: 0.4 cm mass at 9:30 position: Benign on biopsy  Treatment plan: 1.Neoadjuvant chemotherapy with Adriamycin and Cytoxan along with pembrolizumab followed by Taxol carboplatin and pembrolizumab (pembrolizumab maintenance for 1 year) 2.mastectomy withaxillary lymph nodedissection 3.Adjuvant radiation 4.Followed by adjuvant antiestrogen therapy (since she is 10% ER positive) ------------------------------------------------------------------------------------------------------------------------------------------------------ 05/20/2021 echocardiogram EF 60 to 65% 05/20/2021: CT CAP: Large left breast mass with deep involvement of left pectoralis muscle and overlying skin thickening, mild left axillary adenopathy. No metastatic disease elsewhere 05/21/2021: Bone scan: No bone metastases 05/28/2021: Breast MRI: Large mixed cystic and solid enhancing mass involving outer left breast 10 x 7.3 x 8.5 cm posterior involving pectoralis muscle extends to the nipple mass and enhancement together 15 cm. Left axillary lymphadenopathy. ------------------------------------------------------------------------------------------------------- Current Treatment;completed 4 cycles ofAdriamycin, Cytoxan and Pembrolizumab, Today is cycle11[name] Keytrudaevery 3 weeks)  Chemo Toxicities: 1.Fatigue2 to 3 days after chemo 2.Chemotherapy-induced anemia: Hemoglobin today is8.9  3.Taste appears to go away for a week after treatment. 4.Leukopenia:Patient will need Granix prior to each treatment.  Monitoring closely for toxicities. 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  Return to clinic weekly for Taxol chemo We  will order breast MRI for 11/30/21.

## 2021-11-26 NOTE — Progress Notes (Signed)
Patient Care Team: Mike Craze, DO as PCP - General (Internal Medicine) Mauro Kaufmann, RN as Oncology Nurse Navigator Rockwell Germany, RN as Oncology Nurse Navigator  DIAGNOSIS:    ICD-10-CM   1. Malignant neoplasm of upper-outer quadrant of left breast in female, estrogen receptor positive ([place])  C50.412    Z17.0       SUMMARY OF ONCOLOGIC HISTORY: Oncology History  Malignant neoplasm of upper-outer quadrant of left breast in female, estrogen receptor positive ([place])  05/06/2021 Initial Diagnosis   Large circumscribed multicystic mass left breast 9 to 10 cm with multiple left axillary lymph nodes with cortical thickening: biopsy 3 o'clock position: Grade 2 IDC, ER 10%, PR 0%, Ki-67 60%, HER2 negative Right breast: 0.4 cm mass at 9:30 position: Benign on biopsy   05/12/2021 Cancer Staging   Staging form: Breast, AJCC 8th Edition - Clinical stage from 05/12/2021: Stage IIIB (cT3, cN1, cM0, G2, ER-, PR-, HER2-) - Signed by Nicholas Lose, MD on 05/12/2021 Stage prefix: Initial diagnosis Histologic grading system: 3 grade system    05/30/2021 -  Chemotherapy   Patient is on Treatment Plan : BREAST Pembrolizumab + AC q21d x 4 cycles followed by Pembrolizumab + Carboplatin D1 + Paclitaxel D1,8,15 q21d X 4 cycles        CHIEF COMPLIANT: Day 11 Taxol  INTERVAL HISTORY: Denissa Cozart is a 50 y.o. with above-mentioned history of breast cancer currently on neoadjuvant chemotherapy with Taxol carbo along with pembrolizumab. She presents to the clinic today for treatment.  Her treatment has been delayed for cytopenias.  She reports no new problems or concerns.  Denies any nausea or vomiting.  ALLERGIES:  has No Known Allergies.  MEDICATIONS:  Current Outpatient Medications  Medication Sig Dispense Refill   lidocaine-prilocaine (EMLA) cream Apply to affected area once 30 g 3   LORazepam (ATIVAN) 0.5 MG tablet Take 1 tablet (0.5 mg total) by mouth at bedtime as needed for sleep. 30  tablet 0   ondansetron (ZOFRAN) 8 MG tablet Take 1 tablet (8 mg total) by mouth 2 (two) times daily as needed. Start on the third day after carboplatin and AC chemotherapy. 30 tablet 1   prochlorperazine (COMPAZINE) 10 MG tablet Take 1 tablet (10 mg total) by mouth every 6 (six) hours as needed (Nausea or vomiting). 30 tablet 1   traZODone (DESYREL) 50 MG tablet Take 1 tablet (50 mg total) by mouth at bedtime. 30 tablet 1   No current facility-administered medications for this visit.    PHYSICAL EXAMINATION: ECOG PERFORMANCE STATUS: 1 - Symptomatic but completely ambulatory  Vitals:   11/27/21 1056  BP: (!) 149/89  Pulse: 97  Resp: 18  Temp: (!) 97.5 F (36.4 C)  SpO2: 100%   Filed Weights   11/27/21 1056  Weight: 157 lb (71.2 kg)    LABORATORY DATA:  I have reviewed the data as listed CMP Latest Ref Rng & Units 11/20/2021 11/12/2021 11/05/2021  Glucose 70 - 99 mg/dL 118(H) 100(H) 140(H)  BUN 6 - 20 mg/dL 10 14 8   Creatinine 0.44 - 1.00 mg/dL 0.69 0.68 0.74  Sodium 135 - 145 mmol/L 143 137 140  Potassium 3.5 - 5.1 mmol/L 4.1 3.9 3.7  Chloride 98 - 111 mmol/L 109 104 106  CO2 22 - 32 mmol/L 26 25 24   Calcium 8.9 - 10.3 mg/dL 8.9 9.0 8.9  Total Protein 6.5 - 8.1 g/dL 6.2(L) 6.6 6.5  Total Bilirubin 0.3 - 1.2 mg/dL 0.3 0.4 0.3  Alkaline Phos 38 - 126 U/L 74 74 84  AST 15 - 41 U/L 18 19 22   ALT 0 - 44 U/L 20 20 25     Lab Results  Component Value Date   WBC 2.6 (L) 11/27/2021   HGB 8.7 (L) 11/27/2021   HCT 25.1 (L) 11/27/2021   MCV 97.3 11/27/2021   PLT 203 11/27/2021   NEUTROABS PENDING 11/27/2021    ASSESSMENT & PLAN:  Malignant neoplasm of upper-outer quadrant of left breast in female, estrogen receptor positive ([place]) 05/06/2021: Large circumscribed multicystic mass left breast 9 to 10 cm with multiple left axillary lymph nodes with cortical thickening: biopsy 3 o'clock position: Grade 2 IDC, ER 10%, PR 0%, Ki-67 60%, HER2 negative Right breast: 0.4 cm mass at  9:30 position: Benign on biopsy   Treatment plan: 1.  Neoadjuvant chemotherapy with Adriamycin and Cytoxan along with pembrolizumab followed by Taxol carboplatin and pembrolizumab (pembrolizumab maintenance for 1 year) 2. mastectomy with axillary lymph node dissection 3.  Adjuvant radiation 4.  Followed by adjuvant antiestrogen therapy (since she is 10% ER positive) ------------------------------------------------------------------------------------------------------------------------------------------------------ 05/20/2021 echocardiogram EF 60 to 65% 05/20/2021: CT CAP: Large left breast mass with deep involvement of left pectoralis muscle and overlying skin thickening, mild left axillary adenopathy.  No metastatic disease elsewhere 05/21/2021: Bone scan: No bone metastases 05/28/2021: Breast MRI: Large mixed cystic and solid enhancing mass involving outer left breast 10 x 7.3 x 8.5 cm posterior involving pectoralis muscle extends to the nipple mass and enhancement together 15 cm.  Left axillary lymphadenopathy. ------------------------------------------------------------------------------------------------------- Current Treatment; completed 4 cycles of Adriamycin, Cytoxan and Pembrolizumab, Today is cycle 11 [name] Keytruda every 3 weeks)    Chemo Toxicities:  1. Fatigue 2 to 3 days after chemo 2. Chemotherapy-induced anemia: Hemoglobin today is 8.7   3. Taste appears to go away for a week after treatment. 4.  Leukopenia: Patient will need Granix prior to each treatment. We are waiting on today's absolute neutrophil count.  We would like to treat her no matter what. Her WBC count is 2.6. This will be her last cycle of chemotherapy.    Monitoring closely for toxicities. 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   Return to clinic weekly for Taxol chemo We will order breast MRI for 11/30/21.    No orders of the defined types were  placed in this encounter.  The patient has a good understanding of the overall plan. she agrees with it. she will call with any problems that may develop before the next visit here.  Total time spent: 30 mins including face to face time and time spent for planning, charting and coordination of care  Rulon Eisenmenger, MD, MPH 11/27/2021  I, Thana Ates, am acting as scribe for Dr. Nicholas Lose.  I have reviewed the above documentation for accuracy and completeness, and I agree with the above.

## 2021-11-27 ENCOUNTER — Inpatient Hospital Stay (HOSPITAL_BASED_OUTPATIENT_CLINIC_OR_DEPARTMENT_OTHER): Payer: 59 | Admitting: Hematology and Oncology

## 2021-11-27 ENCOUNTER — Encounter: Payer: Self-pay | Admitting: *Deleted

## 2021-11-27 ENCOUNTER — Inpatient Hospital Stay: Payer: 59

## 2021-11-27 ENCOUNTER — Inpatient Hospital Stay: Payer: 59 | Attending: Hematology and Oncology

## 2021-11-27 ENCOUNTER — Other Ambulatory Visit: Payer: Self-pay

## 2021-11-27 DIAGNOSIS — Z170 Estrogen receptor positive status [ER+]: Secondary | ICD-10-CM | POA: Insufficient documentation

## 2021-11-27 DIAGNOSIS — C50412 Malignant neoplasm of upper-outer quadrant of left female breast: Secondary | ICD-10-CM

## 2021-11-27 DIAGNOSIS — Z5111 Encounter for antineoplastic chemotherapy: Secondary | ICD-10-CM | POA: Insufficient documentation

## 2021-11-27 DIAGNOSIS — Z95828 Presence of other vascular implants and grafts: Secondary | ICD-10-CM

## 2021-11-27 LAB — CMP (CANCER CENTER ONLY)
ALT: 24 U/L (ref 0–44)
AST: 41 U/L (ref 15–41)
Albumin: 3.9 g/dL (ref 3.5–5.0)
Alkaline Phosphatase: 75 U/L (ref 38–126)
Anion gap: 9 (ref 5–15)
BUN: 12 mg/dL (ref 6–20)
CO2: 25 mmol/L (ref 22–32)
Calcium: 9.2 mg/dL (ref 8.9–10.3)
Chloride: 108 mmol/L (ref 98–111)
Creatinine: 0.73 mg/dL (ref 0.44–1.00)
GFR, Estimated: >60 mL/min (ref >60)
Glucose, Bld: 98 mg/dL (ref 70–99)
Potassium: 4.1 mmol/L (ref 3.5–5.1)
Sodium: 142 mmol/L (ref 135–145)
Total Bilirubin: 0.3 mg/dL (ref 0.3–1.2)
Total Protein: 6.6 g/dL (ref 6.5–8.1)

## 2021-11-27 LAB — CBC WITH DIFFERENTIAL (CANCER CENTER ONLY)
Abs Immature Granulocytes: 0.01 10*3/uL (ref 0.00–0.07)
Basophils Absolute: 0 10*3/uL (ref 0.0–0.1)
Basophils Relative: 0 %
Eosinophils Absolute: 0 10*3/uL (ref 0.0–0.5)
Eosinophils Relative: 1 %
HCT: 25.1 % — ABNORMAL LOW (ref 36.0–46.0)
Hemoglobin: 8.7 g/dL — ABNORMAL LOW (ref 12.0–15.0)
Immature Granulocytes: 0 %
Lymphocytes Relative: 25 %
Lymphs Abs: 0.6 10*3/uL — ABNORMAL LOW (ref 0.7–4.0)
MCH: 33.7 pg (ref 26.0–34.0)
MCHC: 34.7 g/dL (ref 30.0–36.0)
MCV: 97.3 fL (ref 80.0–100.0)
Monocytes Absolute: 0.3 10*3/uL (ref 0.1–1.0)
Monocytes Relative: 12 %
Neutro Abs: 1.6 10*3/uL — ABNORMAL LOW (ref 1.7–7.7)
Neutrophils Relative %: 62 %
Platelet Count: 203 10*3/uL (ref 150–400)
RBC: 2.58 MIL/uL — ABNORMAL LOW (ref 3.87–5.11)
RDW: 17 % — ABNORMAL HIGH (ref 11.5–15.5)
Smear Review: NORMAL
WBC Count: 2.6 10*3/uL — ABNORMAL LOW (ref 4.0–10.5)
nRBC: 0 % (ref 0.0–0.2)

## 2021-11-27 LAB — TSH: TSH: 2.244 u[IU]/mL (ref 0.308–3.960)

## 2021-11-27 MED ORDER — SODIUM CHLORIDE 0.9 % IV SOLN
10.0000 mg | Freq: Once | INTRAVENOUS | Status: DC
  Filled 2021-11-27 (×2): qty 1

## 2021-11-27 MED ORDER — FAMOTIDINE 20 MG IN NS 100 ML IVPB
20.0000 mg | Freq: Once | INTRAVENOUS | Status: AC
  Administered 2021-11-27: 20 mg via INTRAVENOUS
  Filled 2021-11-27: qty 100

## 2021-11-27 MED ORDER — HEPARIN SOD (PORK) LOCK FLUSH 100 UNIT/ML IV SOLN
500.0000 [IU] | Freq: Once | INTRAVENOUS | Status: AC | PRN
  Administered 2021-11-27: 500 [IU]

## 2021-11-27 MED ORDER — DIPHENHYDRAMINE HCL 50 MG/ML IJ SOLN
50.0000 mg | Freq: Once | INTRAMUSCULAR | Status: AC
  Administered 2021-11-27: 50 mg via INTRAVENOUS
  Filled 2021-11-27: qty 1

## 2021-11-27 MED ORDER — SODIUM CHLORIDE 0.9 % IV SOLN
60.0000 mg/m2 | Freq: Once | INTRAVENOUS | Status: AC
  Administered 2021-11-27: 102 mg via INTRAVENOUS
  Filled 2021-11-27: qty 17

## 2021-11-27 MED ORDER — SODIUM CHLORIDE 0.9% FLUSH
10.0000 mL | Freq: Once | INTRAVENOUS | Status: AC
  Administered 2021-11-27: 10 mL

## 2021-11-27 MED ORDER — SODIUM CHLORIDE 0.9 % IV SOLN
10.0000 mg | Freq: Once | INTRAVENOUS | Status: AC
  Administered 2021-11-27: 10 mg via INTRAVENOUS
  Filled 2021-11-27: qty 1

## 2021-11-27 MED ORDER — SODIUM CHLORIDE 0.9 % IV SOLN: Freq: Once | INTRAVENOUS | Status: AC

## 2021-11-27 MED ORDER — SODIUM CHLORIDE 0.9% FLUSH
10.0000 mL | INTRAVENOUS | Status: DC | PRN
  Administered 2021-11-27: 10 mL

## 2021-11-27 NOTE — Patient Instructions (Signed)
[facility]   Discharge Instructions: Thank you for choosing [facility] to provide your oncology and hematology care.   If you have a lab appointment with the [facility], please go directly to the [facility] and check in at the registration area.   Wear comfortable clothing and clothing appropriate for easy access to any Portacath or PICC line.   We strive to give you quality time with your provider. You may need to reschedule your appointment if you arrive late (15 or more minutes).  Arriving late affects you and other patients whose appointments are after yours.  Also, if you miss three or more appointments without notifying the office, you may be dismissed from the clinic at the provider's discretion.      For prescription refill requests, have your pharmacy contact our office and allow 72 hours for refills to be completed.    Today you received the following chemotherapy and/or immunotherapy agents: Paclitaxel (Taxol)   To help prevent nausea and vomiting after your treatment, we encourage you to take your nausea medication as directed.  BELOW ARE SYMPTOMS THAT SHOULD BE REPORTED IMMEDIATELY: *FEVER GREATER THAN 100.4 F (38 C) OR HIGHER *CHILLS OR SWEATING *NAUSEA AND VOMITING THAT IS NOT CONTROLLED WITH YOUR NAUSEA MEDICATION *UNUSUAL SHORTNESS OF BREATH *UNUSUAL BRUISING OR BLEEDING *URINARY PROBLEMS (pain or burning when urinating, or frequent urination) *BOWEL PROBLEMS (unusual diarrhea, constipation, pain near the anus) TENDERNESS IN MOUTH AND THROAT WITH OR WITHOUT PRESENCE OF ULCERS (sore throat, sores in mouth, or a toothache) UNUSUAL RASH, SWELLING OR PAIN  UNUSUAL VAGINAL DISCHARGE OR ITCHING   Items with * indicate a potential emergency and should be followed up as soon as possible or go to the Emergency Department if any problems should occur.  Please show the CHEMOTHERAPY ALERT CARD or IMMUNOTHERAPY ALERT CARD at  check-in to the Emergency Department and triage nurse.  Should you have questions after your visit or need to cancel or reschedule your appointment, please contact [facility]  Dept: [phone]  and follow the prompts.  Office hours are 8:00 a.m. to 4:30 p.m. Monday - Friday. Please note that voicemails left after 4:00 p.m. may not be returned until the following business day.  We are closed weekends and major holidays. You have access to a nurse at all times for urgent questions. Please call the main number to the clinic Dept: [phone] and follow the prompts.   For any non-urgent questions, you may also contact your provider using MyChart. We now offer e-Visits for anyone 75 and older to request care online for non-urgent symptoms. For details visit [email or web address].   Also download the MyChart app! Go to the app store, search "MyChart", open the app, select [facility], and log in with your MyChart username and password.  Due to Covid, a mask is required upon entering the hospital/clinic. If you do not have a mask, one will be given to you upon arrival. For doctor visits, patients may have 1 support person aged 21 or older with them. For treatment visits, patients cannot have anyone with them due to current Covid guidelines and our immunocompromised population.

## 2021-11-28 LAB — T4: T4, Total: 10.1 ug/dL (ref 4.5–12.0)

## 2021-11-30 ENCOUNTER — Ambulatory Visit
Admission: RE | Admit: 2021-11-30 | Discharge: 2021-11-30 | Disposition: A | Discharge status: Home / Self Care | Payer: 59 | Source: Ambulatory Visit | Attending: Hematology and Oncology | Admitting: Hematology and Oncology

## 2021-11-30 ENCOUNTER — Encounter: Payer: Self-pay | Admitting: *Deleted

## 2021-11-30 DIAGNOSIS — Z170 Estrogen receptor positive status [ER+]: Secondary | ICD-10-CM

## 2021-11-30 DIAGNOSIS — C50412 Malignant neoplasm of upper-outer quadrant of left female breast: Secondary | ICD-10-CM

## 2021-11-30 IMAGING — MR MR BREAST BILAT WO/W CM
8 of 13 series · 31 of 48 positions shown · IV contrast (gadavist)
Comparison: Prior exams including the previous breast MRI dated
[DATE].

CLINICAL DATA: Follow-up for neoadjuvant chemotherapy. Patient
diagnosed with a large left breast carcinoma on [DATE]. She has
also undergone to benign right breast biopsies. A suspicious lymph
node noted on diagnostic imaging, [DATE], could not be safely
biopsied on [DATE]. Assess for response to neoadjuvant
chemotherapy.

EXAM:
BILATERAL BREAST MRI WITH AND WITHOUT CONTRAST
TECHNIQUE: Multiplanar, multisequence MR images of both breasts were obtained
prior to and following the intravenous administration of 7 ml of
Gadavist

[Series 2: t2_tirm_tra ipat (a-p) · axial · 3.0mm · 0.70mm/px · 1 of 55 slices shown]
[im 1/55]
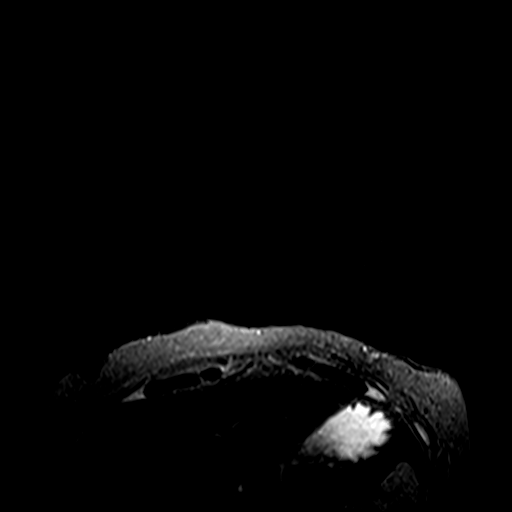

[Series 3: fl3d pre-cm no · axial · non-contrast · 1.2mm · 0.89mm/px · z∈[-86,+66]mm · 5 of 128 slices shown]
[im 1/128]
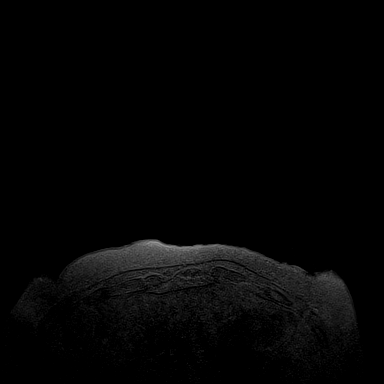
[im 32/128]
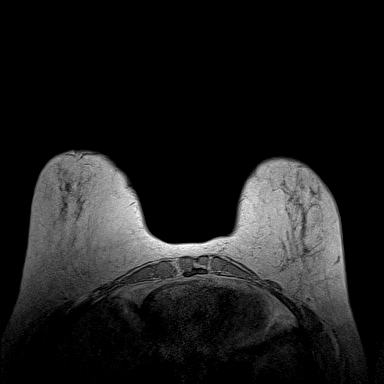
[im 64/128]
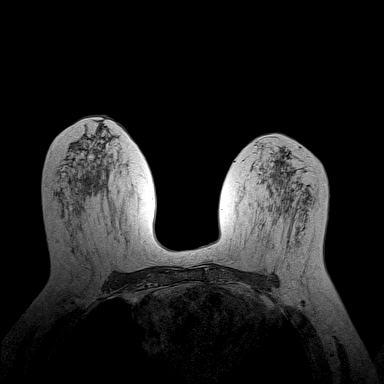
[im 96/128]
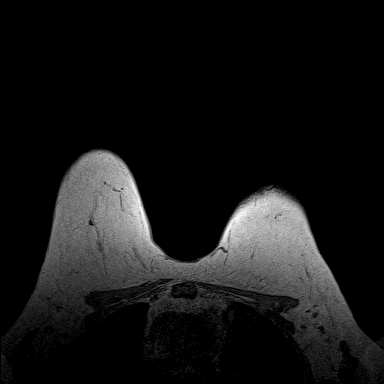
[im 128/128]
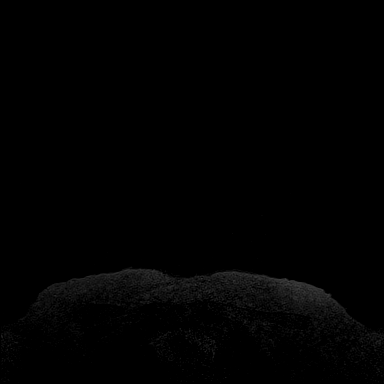

[Series 4: fl3d pre-cm · axial · non-contrast · 1.2mm · 0.89mm/px · z∈[-86,+66]mm · 5 of 128 slices shown]
[im 1/128]
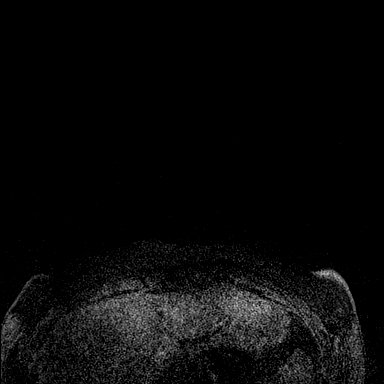
[im 32/128]
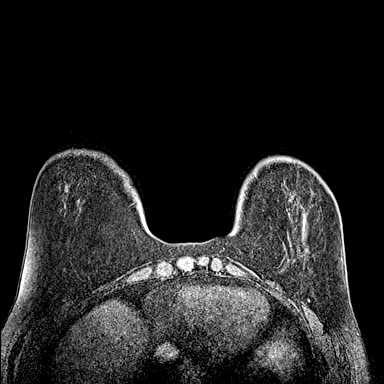
[im 64/128]
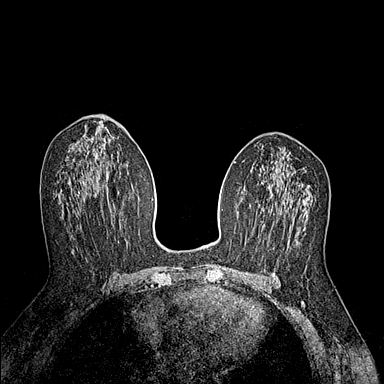
[im 96/128]
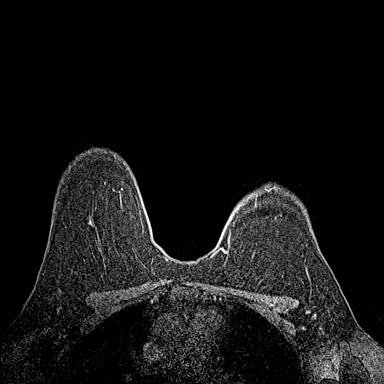
[im 128/128]
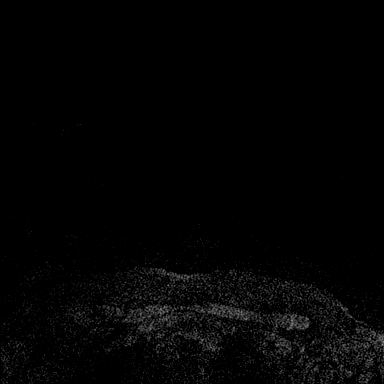

[Series 5: fl3d post-cm 20 · axial · 1.2mm · 0.89mm/px · z∈[-86,+66]mm · 5 of 128 slices shown (1 of 3)]
[im 1/128]
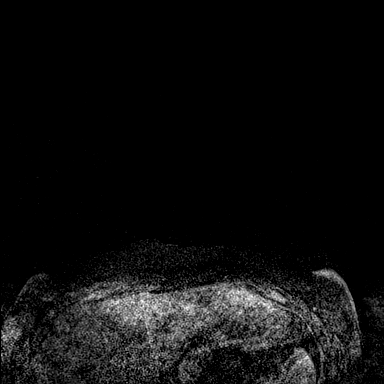
[im 32/128]
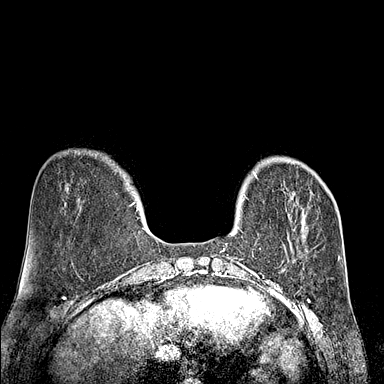
[im 64/128]
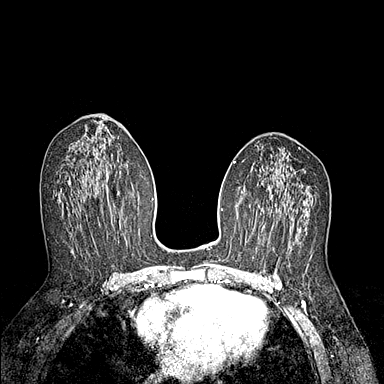
[im 96/128]
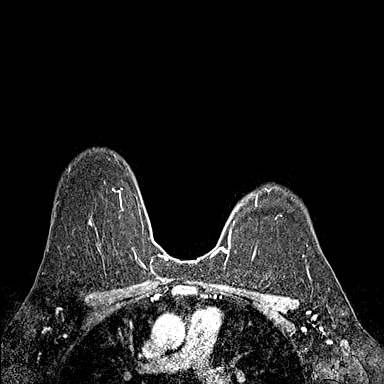
[im 128/128]
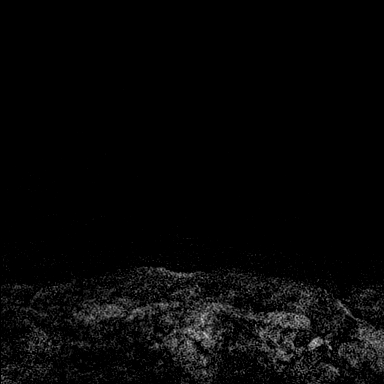

[Series 6: fl3d post-cm 20 · axial · 1.2mm · 0.89mm/px · z∈[-86,+66]mm · 5 of 128 slices shown (2 of 3)]
[im 1/128]
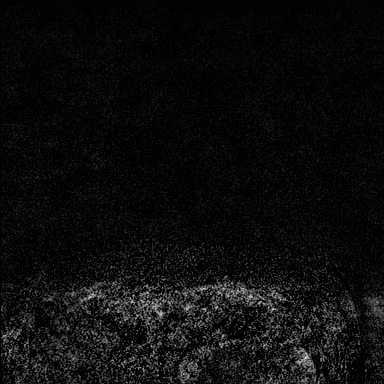
[im 32/128]
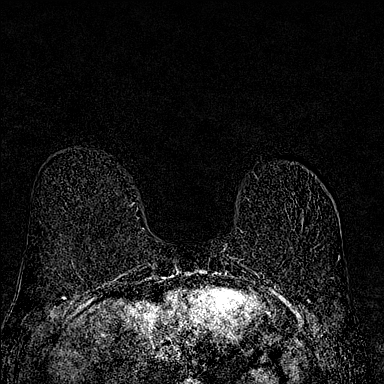
[im 64/128]
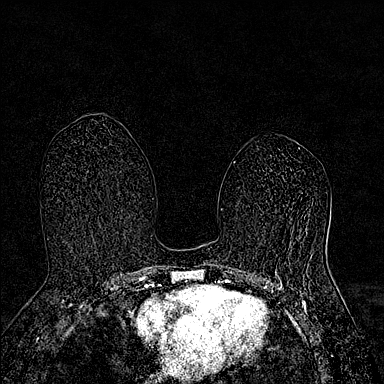
[im 96/128]
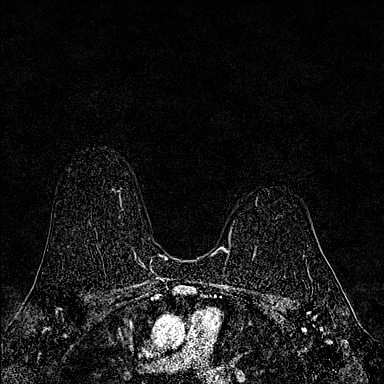
[im 128/128]
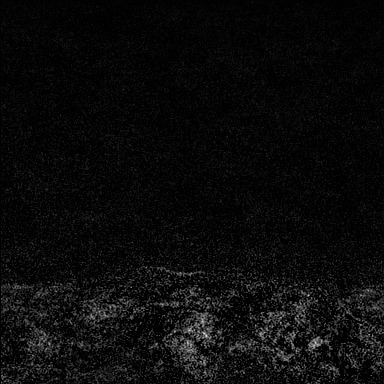

[Series 7: fl3d post-cm 20 · axial · 153.6mm · 0.89mm/px · 1 of 1 slices shown (3 of 3)]
[im 1/1]
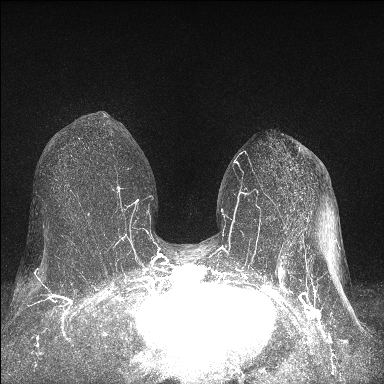

[Series 8: fl3d post-cm 3 · axial · 1.2mm · 0.89mm/px · z∈[-86,+66]mm · 5 of 128 slices shown (1 of 2)]
[im 1/128]
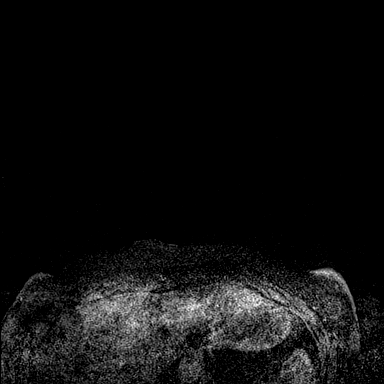
[im 32/128]
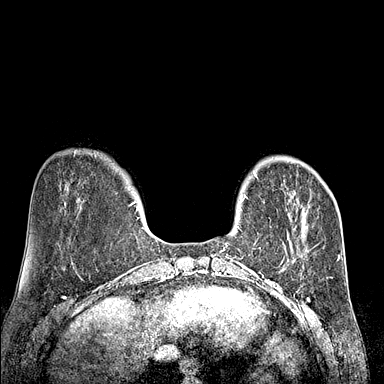
[im 64/128]
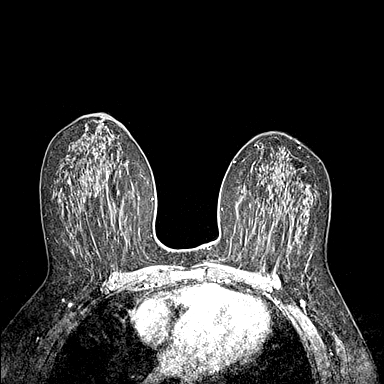
[im 96/128]
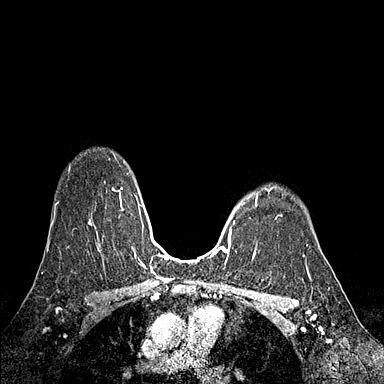
[im 128/128]
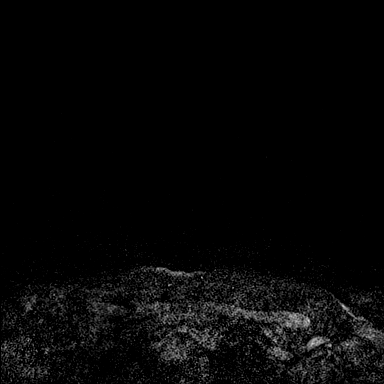

[Series 9: fl3d post-cm 3 · axial · 1.2mm · 0.89mm/px · z∈[-86,+5]mm · 4 of 128 slices shown (2 of 2)]
[im 1/128]
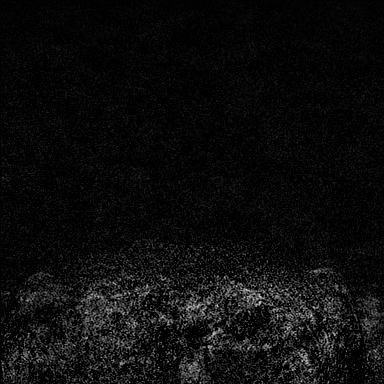
[im 26/128]
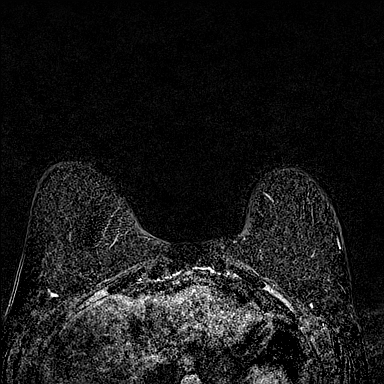
[im 51/128]
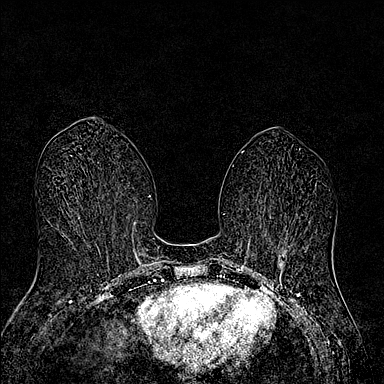
[im 77/128]
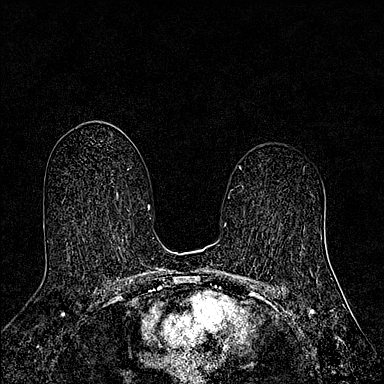

[31 of 48 positions shown; findings below may reference images not displayed]

Three-dimensional MR images were rendered by post-processing of the
original MR data on an independent workstation. The
three-dimensional MR images were interpreted, and findings are
reported in the following complete MRI report for this study. Three
dimensional images were evaluated at the independent interpreting
workstation using the DynaCAD thin client.
FINDINGS: Breast composition: c. Heterogeneous fibroglandular tissue.

Background parenchymal enhancement: Minimal

Right breast: No mass or abnormal enhancement. Susceptibility
artifact noted from the ribbon and coil shaped post biopsy marking
clips from the prior benign biopsies.

Left breast: NILSA non mass enhancement lies in the posterior left
breast, slightly towards the upper-outer quadrant, surrounding a
focus of susceptibility artifact reflecting the ribbon shaped post
biopsy marker clip. Enhancement spans 2.9 x 1.2 x 1.3 cm. There is
no other abnormal enhancement within the left breast.

Lymph nodes: Enlarged/abnormal left axillary lymph nodes noted on
the prior study have markedly improved, now all normal in size and
configuration. There is no current cortical thickening. The most
abnormal lymph node noted on the prior study had a cortical
thickness on MRI of 9 mm, currently 3 mm. No right axillary
lymphadenopathy.

Ancillary findings: Diffuse increased T2 signal and enhancement
throughout the sternum and visualized ribs, with no defined bone
lesion. This is consistent with bone marrow reactivation, and may be
from bone marrow stimulation medication.
IMPRESSION: 1. Marked positive response to neoadjuvant chemotherapy. The large
left breast cystic and solid mass has essentially resolved, with
only faint non mass enhancement persisting in the posterior,
slightly upper outer left breast adjacent to the post biopsy marker
clip. This residual minimal enhancement measures 2.9 x 1.2 x 1.3 cm.
2. Previously seen left axillary lymphadenopathy has also markedly
improved, now with no abnormal lymph nodes.
3. No new abnormalities.

RECOMMENDATION:
1. Treatment as planned for the known left breast malignancy.

BI-RADS CATEGORY  6: Known biopsy-proven malignancy.

## 2021-11-30 MED ORDER — GADOBUTROL 1 MMOL/ML IV SOLN
7.0000 mL | Freq: Once | INTRAVENOUS | Status: AC | PRN
  Administered 2021-11-30: 7 mL via INTRAVENOUS

## 2021-12-08 ENCOUNTER — Ambulatory Visit: Payer: Self-pay | Admitting: General Surgery

## 2021-12-11 ENCOUNTER — Encounter: Payer: Self-pay | Admitting: *Deleted

## 2021-12-16 ENCOUNTER — Encounter: Payer: Self-pay | Admitting: Rehabilitation

## 2021-12-16 ENCOUNTER — Other Ambulatory Visit: Payer: Self-pay

## 2021-12-16 ENCOUNTER — Ambulatory Visit: Payer: 59 | Attending: General Surgery | Admitting: Rehabilitation

## 2021-12-16 DIAGNOSIS — C50412 Malignant neoplasm of upper-outer quadrant of left female breast: Secondary | ICD-10-CM | POA: Diagnosis present

## 2021-12-16 DIAGNOSIS — Z170 Estrogen receptor positive status [ER+]: Secondary | ICD-10-CM | POA: Insufficient documentation

## 2021-12-16 DIAGNOSIS — R293 Abnormal posture: Secondary | ICD-10-CM | POA: Diagnosis present

## 2021-12-16 NOTE — Therapy (Signed)
[facility] @ [address] [place], [place], [address] Phone: [phone]   Fax:  [phone]  Physical Therapy Evaluation  Patient Details  Name: Sheena Mcdaniel MRN: [id number] Date of Birth: 1971/07/08 Referring Provider (PT): Dr. Marlou Starks   Encounter Date: 12/16/2021   PT End of Session - 12/16/21 1351     Visit Number 1    Number of Visits 2    Date for PT Re-Evaluation 01/27/22    PT Start Time 1300    PT Stop Time 6314    PT Time Calculation (min) 47 min    Activity Tolerance Patient tolerated treatment well    Behavior During Therapy [facility] for tasks assessed/performed             Past Medical History:  Diagnosis Date   Anxiety    Cancer ([place]) 03/2021   left breast IDC    Past Surgical History:  Procedure Laterality Date   PORTACATH PLACEMENT N/A 05/22/2021   Procedure: INSERTION PORT-A-CATH;  Surgeon: Jovita Kussmaul, MD;  Location: [facility];  Service: General;  Laterality: N/A;   WISDOM TOOTH EXTRACTION      There were no vitals filed for this visit.    Subjective Assessment - 12/16/21 1259     Subjective I need to get a sleeve    Pertinent History Dx of left breast cancer with multiple abnormal lymph nodes back in June. The cancer was a triple negative with a Ki-67 of 60%. She has undergone neoadjuvant chemotherapy and has had a good response. The cancer now measures 2.9 cm and the lymph nodes all looked normal. She is now ready for bil mastectomy with ALND on 01/01/22 . Her last dose of chemotherapy was the first week of December    Patient Stated Goals get a sleeve and baselines    Currently in Pain? No/denies                [facility] PT Assessment - 12/16/21 0001       Assessment   Medical Diagnosis Left breast cancer    Referring Provider (PT) Dr. Marlou Starks    Onset Date/Surgical Date 12/16/21    Hand Dominance Right    Prior Therapy no      Precautions   Precaution Comments  active cancer      Balance Screen   Has the patient fallen in the past 6 months No    Has the patient had a decrease in activity level because of a fear of falling?  No    Is the patient reluctant to leave their home because of a fear of falling?  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   Leisure lift weights and walking      ROM / Strength   AROM / PROM / Strength AROM      AROM   AROM Assessment Site Shoulder    Right/Left Shoulder Right;Left    Right Shoulder Extension 65 Degrees    Right Shoulder Flexion 155 Degrees    Right Shoulder ABduction 155 Degrees    Right Shoulder Internal Rotation 95 Degrees    Right Shoulder  External Rotation 100 Degrees    Left Shoulder Extension 60 Degrees    Left Shoulder Flexion 150 Degrees    Left Shoulder ABduction 155 Degrees    Left Shoulder Internal Rotation 100 Degrees    Left Shoulder External Rotation 95 Degrees               LYMPHEDEMA/ONCOLOGY QUESTIONNAIRE - 12/16/21 0001       Type   Cancer Type Lt breast      Treatment   Active Chemotherapy Treatment No    Past Chemotherapy Treatment Yes      Lymphedema Assessments   Lymphedema Assessments Upper extremities             L-DEX FLOWSHEETS - 12/16/21 1300       L-DEX LYMPHEDEMA SCREENING   Measurement Type Unilateral    L-DEX MEASUREMENT EXTREMITY Upper Extremity    POSITION  Standing    DOMINANT SIDE Right    At Risk Side Left    BASELINE SCORE (UNILATERAL) 3                  Quick Dash - 12/16/21 0001     Open a tight or new jar No difficulty    Do heavy household chores (wash walls, wash floors) No difficulty    Carry a shopping bag or briefcase No difficulty     Wash your back No difficulty    Use a knife to cut food No difficulty    Recreational activities in which you take some force or impact through your arm, shoulder, or hand (golf, hammering, tennis) No difficulty    During the past week, to what extent has your arm, shoulder or hand problem interfered with your normal social activities with family, friends, neighbors, or groups? Not at all    During the past week, to what extent has your arm, shoulder or hand problem limited your work or other regular daily activities Not at all    Arm, shoulder, or hand pain. None    Tingling (pins and needles) in your arm, shoulder, or hand None    Difficulty Sleeping No difficulty    DASH Score 0 %              Objective measurements completed on examination: See above findings.                PT Education - 12/16/21 1350     Education Details post op exercises, use of sleeve, how to order sleeve, POC    Person(s) Educated Patient;Spouse    Methods Explanation;Demonstration;Handout    Comprehension Verbalized understanding                 PT Long Term Goals - 12/16/21 1354       PT LONG TERM GOAL #1   Title Pt will return to baseline AROM    Time 6    Period Weeks    Status New      PT LONG TERM GOAL #2   Title Pt will be scheduled for [facility] surveillance    Time [facility] Goals - 12/16/21 1354       Patient will be able to verbalize understanding of pertinent lymphedema risk reduction practices relevant to her diagnosis specifically related to skin care.   Status Achieved      Patient will be  able to return demonstrate and/or verbalize understanding of the post-op home exercise program related to regaining shoulder range of motion.   Status Achieved      Patient will be able to verbalize understanding of the importance of attending the postoperative After Breast Cancer Class for further lymphedema risk  reduction education and therapeutic exercise.   Status Achieved                   Plan - 12/16/21 1352     Clinical Impression Statement Pt presents post neoadjuvant chemotherapy and pre bil mastectomy without reconstruction and ALND where all LNs will be removed from the Lt side for baselines and sleeve measuring.  Pt was given information on ordering Medi Harmony size 3 which she tried on in clinic and it fit well.  Pt was educated on post op exercises to start, [place] surveillance, and POC.    Stability/Clinical Decision Making Stable/Uncomplicated    Clinical Decision Making Low    Rehab Potential Excellent    PT Frequency --   post op visit with more as needed   PT Duration 6 weeks    PT Treatment/Interventions ADLs/Self Care Home Management;Therapeutic exercise;Patient/family education;Manual techniques    PT Next Visit Plan post op visit - sing up for ABC class, get measurements as forgot to input these on eval after ordering sleeve    PT Home Exercise Plan post op    Consulted and Agree with Plan of Care Patient             Patient will benefit from skilled therapeutic intervention in order to improve the following deficits and impairments:  Postural dysfunction, Decreased knowledge of precautions  Visit Diagnosis: Malignant neoplasm of upper-outer quadrant of left breast in female, estrogen receptor positive ([place])  Abnormal posture     Problem List Patient Active Problem List   Diagnosis Date Noted   Port-A-Cath in place 06/18/2021   Malignant neoplasm of upper-outer quadrant of left breast in female, estrogen receptor positive ([place]) 05/12/2021   Breast pain, left 04/24/2021   Elevated blood pressure reading 04/24/2021   Perimenopause 04/24/2021    Stark Bray, PT 12/16/2021, 1:55 PM  [facility] @ [address] [place], [place], [address] Phone: [phone]   Fax:  [phone]  Name: Sheena Mcdaniel MRN: [id number] Date of Birth: 1971-09-24

## 2021-12-16 NOTE — Patient Instructions (Signed)

## 2021-12-22 ENCOUNTER — Telehealth: Payer: Self-pay | Admitting: Radiation Oncology

## 2021-12-22 ENCOUNTER — Encounter: Payer: Self-pay | Admitting: *Deleted

## 2021-12-22 DIAGNOSIS — Z170 Estrogen receptor positive status [ER+]: Secondary | ICD-10-CM

## 2021-12-22 NOTE — Telephone Encounter (Signed)
Called patient to schedule a consultation w. Dr. Moody. No answer, LVM for a return call.  

## 2021-12-24 ENCOUNTER — Encounter: Payer: Self-pay | Admitting: Hematology and Oncology

## 2021-12-29 ENCOUNTER — Encounter: Payer: Self-pay | Admitting: Hematology and Oncology

## 2021-12-29 NOTE — Progress Notes (Signed)
Surgical Instructions    Your procedure is scheduled on Friday, January 6th, 2023.   Report to [facility] Main Entrance "A" at 11:15 A.M., then check in with the Admitting office.  Call this number if you have problems the morning of surgery:  [phone]   If you have any questions prior to your surgery date call [phone]: Open Monday-Friday 8am-4pm    Remember:  Do not eat after midnight the night before your surgery  You may drink clear liquids until 11:15 the morning of your surgery.   Clear liquids allowed are: Water, Non-Citrus Juices (without pulp), Carbonated Beverages, Clear Tea, Black Coffee ONLY (NO MILK, CREAM OR POWDERED CREAMER of any kind), and Gatorade  Patient Instructions  The night before surgery:  No food after midnight. ONLY clear liquids after midnight  The day of surgery (if you do NOT have diabetes):  Drink ONE (1) Pre-Surgery Clear Ensure by 11:15 the morning of surgery. Drink in one sitting. Do not sip.  This drink was given to you during your hospital  pre-op appointment visit.  Nothing else to drink after completing the  Pre-Surgery Clear Ensure.         If you have questions, please contact your surgeons office.     Take these medicines the morning of surgery with A SIP OF WATER: NONE    As of today, STOP taking any Aspirin (unless otherwise instructed by your surgeon) Aleve, Naproxen, Ibuprofen, Motrin, Advil, Goody's, BC's, all herbal medications, fish oil, and all vitamins.   After your COVID test   You are not required to quarantine however you are required to wear a well-fitting mask when you are out and around people not in your household.  If your mask becomes wet or soiled, replace with a new one.  Wash your hands often with soap and water for 20 seconds or clean your hands with an alcohol-based hand sanitizer that contains at least 60% alcohol.  Do not share personal items.  Notify your provider: if you are in close contact  with someone who has COVID  or if you develop a fever of 100.4 or greater, sneezing, cough, sore throat, shortness of breath or body aches.    The day of surgery:          Do not wear jewelry or makeup Do not wear lotions, powders, perfumes, or deodorant. Do not shave 48 hours prior to surgery. Do not bring valuables to the hospital. DO Not wear nail polish, gel polish, artificial nails, or any other type of covering on natural nails including finger and toenails. If patients have artificial nails, gel coating, etc. that need to be removed by a nail salon, please have this removed prior to surgery or surgery may need to be canceled/delayed if the surgeon/ anesthesia feels like the patient is unable to be adequately monitored.              [facility] is not responsible for any belongings or valuables.  Do NOT Smoke (Tobacco/Vaping)  24 hours prior to your procedure  If you use a CPAP at night, you may bring your mask for your overnight stay.   Contacts, glasses, hearing aids, dentures or partials may not be worn into surgery, please bring cases for these belongings   For patients admitted to the hospital, discharge time will be determined by your treatment team.   Patients discharged the day of surgery will not be allowed to drive home, and someone needs to stay  with them for 24 hours.  NO VISITORS WILL BE ALLOWED IN PRE-OP WHERE PATIENTS ARE PREPPED FOR SURGERY.  ONLY 1 SUPPORT PERSON MAY BE PRESENT IN THE WAITING ROOM WHILE YOU ARE IN SURGERY.  IF YOU ARE TO BE ADMITTED, ONCE YOU ARE IN YOUR ROOM YOU WILL BE ALLOWED TWO (2) VISITORS. 1 (ONE) VISITOR MAY STAY OVERNIGHT BUT MUST ARRIVE TO THE ROOM BY 8pm.  Minor children may have two parents present. Special consideration for safety and communication needs will be reviewed on a case by case basis.  Special instructions:    Oral Hygiene is also important to reduce your risk of infection.  Remember - BRUSH YOUR TEETH THE MORNING OF  SURGERY WITH YOUR REGULAR TOOTHPASTE   [facility]- Preparing For Surgery  Before surgery, you can play an important role. Because skin is not sterile, your skin needs to be as free of germs as possible. You can reduce the number of germs on your skin by washing with CHG (chlorahexidine gluconate) Soap before surgery.  CHG is an antiseptic cleaner which kills germs and bonds with the skin to continue killing germs even after washing.     Please do not use if you have an allergy to CHG or antibacterial soaps. If your skin becomes reddened/irritated stop using the CHG.  Do not shave (including legs and underarms) for at least 48 hours prior to first CHG shower. It is OK to shave your face.  Please follow these instructions carefully.     Shower the NIGHT BEFORE SURGERY and the MORNING OF SURGERY with CHG Soap.   If you chose to wash your hair, wash your hair first as usual with your normal shampoo. After you shampoo, rinse your hair and body thoroughly to remove the shampoo.  Then [employer] and genitals (private parts) with your normal soap and rinse thoroughly to remove soap.  After that Use CHG Soap as you would any other liquid soap. You can apply CHG directly to the skin and wash gently with a scrungie or a clean washcloth.   Apply the CHG Soap to your body ONLY FROM THE NECK DOWN.  Do not use on open wounds or open sores. Avoid contact with your eyes, ears, mouth and genitals (private parts). Wash Face and genitals (private parts)  with your normal soap.   Wash thoroughly, paying special attention to the area where your surgery will be performed.  Thoroughly rinse your body with warm water from the neck down.  DO NOT shower/wash with your normal soap after using and rinsing off the CHG Soap.  Pat yourself dry with a CLEAN TOWEL.  Wear CLEAN PAJAMAS to bed the night before surgery  Place CLEAN SHEETS on your bed the night before your surgery  DO NOT SLEEP WITH PETS.   Day of  Surgery:  Take a shower with CHG soap. Wear Clean/Comfortable clothing the morning of surgery Do not apply any deodorants/lotions.   Remember to brush your teeth WITH YOUR REGULAR TOOTHPASTE.   Please read over the following fact sheets that you were given.

## 2021-12-30 ENCOUNTER — Encounter (HOSPITAL_COMMUNITY): Payer: Self-pay

## 2021-12-30 ENCOUNTER — Other Ambulatory Visit: Payer: Self-pay

## 2021-12-30 ENCOUNTER — Encounter (HOSPITAL_COMMUNITY)
Admission: RE | Admit: 2021-12-30 | Discharge: 2021-12-30 | Disposition: A | Discharge status: Home / Self Care | Payer: Managed Care, Other (non HMO) | Source: Ambulatory Visit | Attending: General Surgery | Admitting: General Surgery

## 2021-12-30 VITALS — BP 152/86 | HR 83 | Temp 98.4°F | Resp 17 | Ht 64.0 in | Wt 157.2 lb

## 2021-12-30 DIAGNOSIS — Z01818 Encounter for other preprocedural examination: Secondary | ICD-10-CM

## 2021-12-30 DIAGNOSIS — Z20822 Contact with and (suspected) exposure to covid-19: Secondary | ICD-10-CM | POA: Diagnosis not present

## 2021-12-30 DIAGNOSIS — Z01812 Encounter for preprocedural laboratory examination: Secondary | ICD-10-CM | POA: Insufficient documentation

## 2021-12-30 LAB — CBC
HCT: 33.1 % — ABNORMAL LOW (ref 36.0–46.0)
Hemoglobin: 10.3 g/dL — ABNORMAL LOW (ref 12.0–15.0)
MCH: 29.7 pg (ref 26.0–34.0)
MCHC: 31.1 g/dL (ref 30.0–36.0)
MCV: 95.4 fL (ref 80.0–100.0)
Platelets: 331 10*3/uL (ref 150–400)
RBC: 3.47 MIL/uL — ABNORMAL LOW (ref 3.87–5.11)
RDW: 14.2 % (ref 11.5–15.5)
WBC: 4.8 10*3/uL (ref 4.0–10.5)
nRBC: 0 % (ref 0.0–0.2)

## 2021-12-30 LAB — BASIC METABOLIC PANEL
Anion gap: 8 (ref 5–15)
BUN: 10 mg/dL (ref 6–20)
CO2: 29 mmol/L (ref 22–32)
Calcium: 9.6 mg/dL (ref 8.9–10.3)
Chloride: 101 mmol/L (ref 98–111)
Creatinine, Ser: 0.87 mg/dL (ref 0.44–1.00)
GFR, Estimated: >60 mL/min (ref >60)
Glucose, Bld: 101 mg/dL — ABNORMAL HIGH (ref 70–99)
Potassium: 3.4 mmol/L — ABNORMAL LOW (ref 3.5–5.1)
Sodium: 138 mmol/L (ref 135–145)

## 2021-12-30 LAB — SARS CORONAVIRUS 2 (TAT 6-24 HRS): SARS Coronavirus 2: NEGATIVE

## 2021-12-30 NOTE — Progress Notes (Addendum)
Surgical Instructions    Your procedure is scheduled on Friday, January 6th, 2023.   Report to [facility] Main Entrance "A" at 9:30 A.M., then check in with the Admitting office.  Call this number if you have problems the morning of surgery:  [phone]   If you have any questions prior to your surgery date call [phone]: Open Monday-Friday 8am-4pm    Remember:  Do not eat after midnight the night before your surgery  You may drink clear liquids until 8:30 the morning of your surgery.   Clear liquids allowed are: Water, Non-Citrus Juices (without pulp), Carbonated Beverages, Clear Tea, Black Coffee ONLY (NO MILK, CREAM OR POWDERED CREAMER of any kind), and Gatorade    Take these medicines the morning of surgery with A SIP OF WATER: NONE    As of today, STOP taking any Aspirin (unless otherwise instructed by your surgeon) Aleve, Naproxen, Ibuprofen, Motrin, Advil, Goody's, BC's, all herbal medications, fish oil, and all vitamins.   After your COVID test   You are not required to quarantine however you are required to wear a well-fitting mask when you are out and around people not in your household.  If your mask becomes wet or soiled, replace with a new one.  Wash your hands often with soap and water for 20 seconds or clean your hands with an alcohol-based hand sanitizer that contains at least 60% alcohol.  Do not share personal items.  Notify your provider: if you are in close contact with someone who has COVID  or if you develop a fever of 100.4 or greater, sneezing, cough, sore throat, shortness of breath or body aches.    The day of surgery:          Do not wear jewelry or makeup Do not wear lotions, powders, perfumes, or deodorant. Do not shave 48 hours prior to surgery. Do not bring valuables to the hospital. DO Not wear nail polish, gel polish, artificial nails, or any other type of covering on natural nails including finger and toenails. If patients have  artificial nails, gel coating, etc. that need to be removed by a nail salon, please have this removed prior to surgery. Surgery may need to be canceled/delayed if the surgeon/ anesthesia feels like the patient is unable to be adequately monitored.              [facility] is not responsible for any belongings or valuables.  Do NOT Smoke (Tobacco/Vaping)  24 hours prior to your procedure  If you use a CPAP at night, you may bring your mask for your overnight stay.   Contacts, glasses, hearing aids, dentures or partials may not be worn into surgery, please bring cases for these belongings   For patients admitted to the hospital, discharge time will be determined by your treatment team.   Patients discharged the day of surgery will not be allowed to drive home, and someone needs to stay with them for 24 hours.  NO VISITORS WILL BE ALLOWED IN PRE-OP WHERE PATIENTS ARE PREPPED FOR SURGERY.  ONLY 1 SUPPORT PERSON MAY BE PRESENT IN THE WAITING ROOM WHILE YOU ARE IN SURGERY.  IF YOU ARE TO BE ADMITTED, ONCE YOU ARE IN YOUR ROOM YOU WILL BE ALLOWED TWO (2) VISITORS. 1 (ONE) VISITOR MAY STAY OVERNIGHT BUT MUST ARRIVE TO THE ROOM BY 8pm.  Minor children may have two parents present. Special consideration for safety and communication needs will be reviewed on a case by case basis.  Special instructions:    Oral Hygiene is also important to reduce your risk of infection.  Remember - BRUSH YOUR TEETH THE MORNING OF SURGERY WITH YOUR REGULAR TOOTHPASTE   [facility]- Preparing For Surgery  Before surgery, you can play an important role. Because skin is not sterile, your skin needs to be as free of germs as possible. You can reduce the number of germs on your skin by washing with CHG (chlorahexidine gluconate) Soap before surgery.  CHG is an antiseptic cleaner which kills germs and bonds with the skin to continue killing germs even after washing.     Please do not use if you have an allergy to CHG or  antibacterial soaps. If your skin becomes reddened/irritated stop using the CHG.  Do not shave (including legs and underarms) for at least 48 hours prior to first CHG shower. It is OK to shave your face.  Please follow these instructions carefully.     Shower the NIGHT BEFORE SURGERY and the MORNING OF SURGERY with CHG Soap.   If you chose to wash your hair, wash your hair first as usual with your normal shampoo. After you shampoo, rinse your hair and body thoroughly to remove the shampoo.  Then [employer] and genitals (private parts) with your normal soap and rinse thoroughly to remove soap.  After that Use CHG Soap as you would any other liquid soap. You can apply CHG directly to the skin and wash gently with a scrungie or a clean washcloth.   Apply the CHG Soap to your body ONLY FROM THE NECK DOWN.  Do not use on open wounds or open sores. Avoid contact with your eyes, ears, mouth and genitals (private parts). Wash Face and genitals (private parts)  with your normal soap.   Wash thoroughly, paying special attention to the area where your surgery will be performed.  Thoroughly rinse your body with warm water from the neck down.  DO NOT shower/wash with your normal soap after using and rinsing off the CHG Soap.  Pat yourself dry with a CLEAN TOWEL.  Wear CLEAN PAJAMAS to bed the night before surgery  Place CLEAN SHEETS on your bed the night before your surgery  DO NOT SLEEP WITH PETS.   Day of Surgery:  Take a shower with CHG soap. Wear Clean/Comfortable clothing the morning of surgery Do not apply any deodorants/lotions.   Remember to brush your teeth WITH YOUR REGULAR TOOTHPASTE.   Please read over the following fact sheets that you were given.

## 2021-12-30 NOTE — Progress Notes (Signed)
PCP: Dr. Harlow Ohms Cardiologist: Denies  EKG: n/a CXR: n/a ECHO: 05/20/21 Stress Test: denies Cardiac Cath: denies  Covid tested today  Aware of surgery time change  ERAS: clears until 8:30 am  Patient denies shortness of breath, fever, cough, and chest pain at PAT appointment.  Patient verbalized understanding of instructions provided today at the PAT appointment.  Patient asked to review instructions at home and day of surgery.

## 2022-01-01 ENCOUNTER — Encounter (HOSPITAL_COMMUNITY)
Admission: RE | Disposition: A | Discharge status: Home / Self Care | Payer: Self-pay | Source: Home / Self Care | Attending: General Surgery

## 2022-01-01 ENCOUNTER — Encounter (HOSPITAL_COMMUNITY): Payer: Self-pay | Admitting: General Surgery

## 2022-01-01 ENCOUNTER — Ambulatory Visit (HOSPITAL_COMMUNITY): Payer: Commercial Managed Care - HMO | Admitting: Critical Care Medicine

## 2022-01-01 ENCOUNTER — Other Ambulatory Visit: Payer: Self-pay

## 2022-01-01 ENCOUNTER — Ambulatory Visit (HOSPITAL_COMMUNITY)
Admission: RE | Admit: 2022-01-01 | Discharge: 2022-01-02 | Disposition: A | Discharge status: Home / Self Care | Payer: Commercial Managed Care - HMO | Attending: General Surgery | Admitting: General Surgery

## 2022-01-01 DIAGNOSIS — C50912 Malignant neoplasm of unspecified site of left female breast: Secondary | ICD-10-CM | POA: Diagnosis present

## 2022-01-01 DIAGNOSIS — N6032 Fibrosclerosis of left breast: Secondary | ICD-10-CM | POA: Insufficient documentation

## 2022-01-01 DIAGNOSIS — D241 Benign neoplasm of right breast: Secondary | ICD-10-CM | POA: Diagnosis not present

## 2022-01-01 DIAGNOSIS — Z9221 Personal history of antineoplastic chemotherapy: Secondary | ICD-10-CM | POA: Insufficient documentation

## 2022-01-01 DIAGNOSIS — Z853 Personal history of malignant neoplasm of breast: Secondary | ICD-10-CM | POA: Insufficient documentation

## 2022-01-01 DIAGNOSIS — C50412 Malignant neoplasm of upper-outer quadrant of left female breast: Secondary | ICD-10-CM | POA: Diagnosis present

## 2022-01-01 HISTORY — PX: TOTAL MASTECTOMY: SHX6129

## 2022-01-01 HISTORY — PX: MODIFIED MASTECTOMY: SHX5268

## 2022-01-01 LAB — POCT PREGNANCY, URINE: Preg Test, Ur: NEGATIVE

## 2022-01-01 SURGERY — MODIFIED MASTECTOMY
Anesthesia: General | Site: Breast | Laterality: Right

## 2022-01-01 MED ORDER — GABAPENTIN 100 MG PO CAPS: 100.0000 mg | ORAL_CAPSULE | Freq: Three times a day (TID) | ORAL | 3 refills | Status: DC | PRN

## 2022-01-01 MED ORDER — CEFAZOLIN SODIUM-DEXTROSE 2-4 GM/100ML-% IV SOLN
2.0000 g | INTRAVENOUS | Status: AC
  Administered 2022-01-01: 2 g via INTRAVENOUS
  Filled 2022-01-01: qty 100

## 2022-01-01 MED ORDER — METHOCARBAMOL 500 MG PO TABS: 500.0000 mg | ORAL_TABLET | Freq: Four times a day (QID) | ORAL | Status: DC | PRN

## 2022-01-01 MED ORDER — ACETAMINOPHEN 500 MG PO TABS
1000.0000 mg | ORAL_TABLET | ORAL | Status: AC
  Administered 2022-01-01: 1000 mg via ORAL
  Filled 2022-01-01: qty 2

## 2022-01-01 MED ORDER — ONDANSETRON HCL 4 MG/2ML IJ SOLN
INTRAMUSCULAR | Status: DC | PRN
  Administered 2022-01-01: 4 mg via INTRAVENOUS

## 2022-01-01 MED ORDER — GABAPENTIN 300 MG PO CAPS
300.0000 mg | ORAL_CAPSULE | ORAL | Status: AC
  Administered 2022-01-01: 300 mg via ORAL
  Filled 2022-01-01: qty 1

## 2022-01-01 MED ORDER — FENTANYL CITRATE (PF) 250 MCG/5ML IJ SOLN
INTRAMUSCULAR | Status: DC | PRN
  Administered 2022-01-01 (×2): 50 ug via INTRAVENOUS
  Administered 2022-01-01: 100 ug via INTRAVENOUS
  Administered 2022-01-01: 50 ug via INTRAVENOUS

## 2022-01-01 MED ORDER — SUGAMMADEX SODIUM 200 MG/2ML IV SOLN
INTRAVENOUS | Status: DC | PRN
Start: 2022-01-01 — End: 2022-01-01
  Administered 2022-01-01: 200 mg via INTRAVENOUS

## 2022-01-01 MED ORDER — CHLORHEXIDINE GLUCONATE 0.12 % MT SOLN
15.0000 mL | Freq: Once | OROMUCOSAL | Status: AC
  Administered 2022-01-01: 15 mL via OROMUCOSAL
  Filled 2022-01-01: qty 15

## 2022-01-01 MED ORDER — BUPIVACAINE-EPINEPHRINE (PF) 0.25% -1:200000 IJ SOLN
INTRAMUSCULAR | Status: DC | PRN
  Administered 2022-01-01 (×2): 10 mL

## 2022-01-01 MED ORDER — ONDANSETRON HCL 4 MG/2ML IJ SOLN: 4.0000 mg | Freq: Four times a day (QID) | INTRAMUSCULAR | Status: DC | PRN

## 2022-01-01 MED ORDER — FENTANYL CITRATE (PF) 250 MCG/5ML IJ SOLN
INTRAMUSCULAR | Status: AC
  Filled 2022-01-01: qty 5

## 2022-01-01 MED ORDER — MIDAZOLAM HCL 2 MG/2ML IJ SOLN
INTRAMUSCULAR | Status: AC
  Filled 2022-01-01: qty 2

## 2022-01-01 MED ORDER — BUPIVACAINE-EPINEPHRINE (PF) 0.25% -1:200000 IJ SOLN
INTRAMUSCULAR | Status: AC
  Filled 2022-01-01: qty 30

## 2022-01-01 MED ORDER — MIDAZOLAM HCL 2 MG/2ML IJ SOLN
INTRAMUSCULAR | Status: DC | PRN
  Administered 2022-01-01: 2 mg via INTRAVENOUS

## 2022-01-01 MED ORDER — HYDROMORPHONE HCL 1 MG/ML IJ SOLN
INTRAMUSCULAR | Status: AC
  Filled 2022-01-01: qty 1

## 2022-01-01 MED ORDER — SODIUM CHLORIDE 0.9 % IV SOLN: INTRAVENOUS | Status: DC

## 2022-01-01 MED ORDER — CHLORHEXIDINE GLUCONATE CLOTH 2 % EX PADS: 6.0000 | MEDICATED_PAD | Freq: Once | CUTANEOUS | Status: DC

## 2022-01-01 MED ORDER — CEFAZOLIN SODIUM 1 G IJ SOLR
INTRAMUSCULAR | Status: AC
  Filled 2022-01-01: qty 20

## 2022-01-01 MED ORDER — MORPHINE SULFATE (PF) 2 MG/ML IV SOLN: 1.0000 mg | INTRAVENOUS | Status: DC | PRN

## 2022-01-01 MED ORDER — KETOROLAC TROMETHAMINE 30 MG/ML IJ SOLN
INTRAMUSCULAR | Status: DC | PRN
  Administered 2022-01-01: 30 mg via INTRAVENOUS

## 2022-01-01 MED ORDER — PHENYLEPHRINE 40 MCG/ML (10ML) SYRINGE FOR IV PUSH (FOR BLOOD PRESSURE SUPPORT)
PREFILLED_SYRINGE | INTRAVENOUS | Status: AC
  Filled 2022-01-01: qty 10

## 2022-01-01 MED ORDER — ORAL CARE MOUTH RINSE: 15.0000 mL | Freq: Once | OROMUCOSAL | Status: AC

## 2022-01-01 MED ORDER — HEPARIN SODIUM (PORCINE) 5000 UNIT/ML IJ SOLN
5000.0000 [IU] | Freq: Three times a day (TID) | INTRAMUSCULAR | Status: DC
  Administered 2022-01-02: 5000 [IU] via SUBCUTANEOUS
  Filled 2022-01-01: qty 1

## 2022-01-01 MED ORDER — ONDANSETRON HCL 4 MG/2ML IJ SOLN
INTRAMUSCULAR | Status: AC
  Filled 2022-01-01: qty 2

## 2022-01-01 MED ORDER — HYDROCODONE-ACETAMINOPHEN 5-325 MG PO TABS: 1.0000 | ORAL_TABLET | Freq: Four times a day (QID) | ORAL | 0 refills | Status: DC | PRN

## 2022-01-01 MED ORDER — KETOROLAC TROMETHAMINE 30 MG/ML IJ SOLN
INTRAMUSCULAR | Status: AC
  Filled 2022-01-01: qty 1

## 2022-01-01 MED ORDER — PROPOFOL 10 MG/ML IV BOLUS
INTRAVENOUS | Status: DC | PRN
  Administered 2022-01-01: 160 mg via INTRAVENOUS

## 2022-01-01 MED ORDER — LACTATED RINGERS IV SOLN: INTRAVENOUS | Status: DC

## 2022-01-01 MED ORDER — PANTOPRAZOLE SODIUM 40 MG IV SOLR
40.0000 mg | Freq: Every day | INTRAVENOUS | Status: DC
  Administered 2022-01-01: 40 mg via INTRAVENOUS
  Filled 2022-01-01: qty 40

## 2022-01-01 MED ORDER — LIDOCAINE 2% (20 MG/ML) 5 ML SYRINGE
INTRAMUSCULAR | Status: AC
  Filled 2022-01-01: qty 5

## 2022-01-01 MED ORDER — ONDANSETRON 4 MG PO TBDP: 4.0000 mg | ORAL_TABLET | Freq: Four times a day (QID) | ORAL | Status: DC | PRN

## 2022-01-01 MED ORDER — HYDROMORPHONE HCL 1 MG/ML IJ SOLN
0.2500 mg | INTRAMUSCULAR | Status: DC | PRN
  Administered 2022-01-01: 0.25 mg via INTRAVENOUS

## 2022-01-01 MED ORDER — DEXAMETHASONE SODIUM PHOSPHATE 10 MG/ML IJ SOLN
INTRAMUSCULAR | Status: AC
  Filled 2022-01-01: qty 1

## 2022-01-01 MED ORDER — SUCCINYLCHOLINE CHLORIDE 200 MG/10ML IV SOSY
PREFILLED_SYRINGE | INTRAVENOUS | Status: DC | PRN
  Administered 2022-01-01: 120 mg via INTRAVENOUS

## 2022-01-01 MED ORDER — HYDROCODONE-ACETAMINOPHEN 5-325 MG PO TABS: 1.0000 | ORAL_TABLET | ORAL | Status: DC | PRN

## 2022-01-01 MED ORDER — 0.9 % SODIUM CHLORIDE (POUR BTL) OPTIME
TOPICAL | Status: DC | PRN
Start: 2022-01-01 — End: 2022-01-01
  Administered 2022-01-01 (×2): 1000 mL

## 2022-01-01 MED ORDER — GABAPENTIN 100 MG PO CAPS: 100.0000 mg | ORAL_CAPSULE | Freq: Three times a day (TID) | ORAL | Status: DC | PRN

## 2022-01-01 MED ORDER — METHOCARBAMOL 500 MG PO TABS: 500.0000 mg | ORAL_TABLET | Freq: Four times a day (QID) | ORAL | 1 refills | Status: DC | PRN

## 2022-01-01 MED ORDER — PHENYLEPHRINE HCL (PRESSORS) 10 MG/ML IV SOLN
INTRAVENOUS | Status: AC
  Filled 2022-01-01: qty 1

## 2022-01-01 MED ORDER — ROCURONIUM BROMIDE 10 MG/ML (PF) SYRINGE
PREFILLED_SYRINGE | INTRAVENOUS | Status: AC
  Filled 2022-01-01: qty 10

## 2022-01-01 MED ORDER — PHENYLEPHRINE 40 MCG/ML (10ML) SYRINGE FOR IV PUSH (FOR BLOOD PRESSURE SUPPORT)
PREFILLED_SYRINGE | INTRAVENOUS | Status: DC | PRN
Start: 2022-01-01 — End: 2022-01-01
  Administered 2022-01-01 (×2): 80 ug via INTRAVENOUS
  Administered 2022-01-01: 40 ug via INTRAVENOUS

## 2022-01-01 MED ORDER — LIDOCAINE 2% (20 MG/ML) 5 ML SYRINGE
INTRAMUSCULAR | Status: DC | PRN
  Administered 2022-01-01: 100 mg via INTRAVENOUS

## 2022-01-01 MED ORDER — DIPHENHYDRAMINE HCL 50 MG/ML IJ SOLN
INTRAMUSCULAR | Status: AC
  Filled 2022-01-01: qty 1

## 2022-01-01 MED ORDER — ROCURONIUM BROMIDE 10 MG/ML (PF) SYRINGE
PREFILLED_SYRINGE | INTRAVENOUS | Status: DC | PRN
Start: 2022-01-01 — End: 2022-01-01
  Administered 2022-01-01: 30 mg via INTRAVENOUS
  Administered 2022-01-01: 70 mg via INTRAVENOUS

## 2022-01-01 MED ORDER — SUCCINYLCHOLINE CHLORIDE 200 MG/10ML IV SOSY
PREFILLED_SYRINGE | INTRAVENOUS | Status: AC
  Filled 2022-01-01: qty 10

## 2022-01-01 MED ORDER — DIPHENHYDRAMINE HCL 50 MG/ML IJ SOLN
INTRAMUSCULAR | Status: DC | PRN
Start: 2022-01-01 — End: 2022-01-01
  Administered 2022-01-01: 25 mg via INTRAVENOUS

## 2022-01-01 MED ORDER — CELECOXIB 200 MG PO CAPS
200.0000 mg | ORAL_CAPSULE | ORAL | Status: AC
  Administered 2022-01-01: 200 mg via ORAL
  Filled 2022-01-01: qty 1

## 2022-01-01 MED ORDER — DEXAMETHASONE SODIUM PHOSPHATE 10 MG/ML IJ SOLN
INTRAMUSCULAR | Status: DC | PRN
Start: 2022-01-01 — End: 2022-01-01
  Administered 2022-01-01: 10 mg via INTRAVENOUS

## 2022-01-01 MED ORDER — PHENYLEPHRINE HCL-NACL 20-0.9 MG/250ML-% IV SOLN
INTRAVENOUS | Status: DC | PRN
  Administered 2022-01-01: 20 ug/min via INTRAVENOUS

## 2022-01-01 SURGICAL SUPPLY — 36 items
APPLIER CLIP 9.375 MED OPEN (MISCELLANEOUS) ×6
BAG COUNTER SPONGE SURGICOUNT (BAG) ×3 IMPLANT
BINDER BREAST LRG (GAUZE/BANDAGES/DRESSINGS) IMPLANT
BINDER BREAST XLRG (GAUZE/BANDAGES/DRESSINGS) ×1 IMPLANT
BIOPATCH RED 1 DISK 7.0 (GAUZE/BANDAGES/DRESSINGS) ×6 IMPLANT
CANISTER SUCT 3000ML PPV (MISCELLANEOUS) ×4 IMPLANT
CHLORAPREP W/TINT 26 (MISCELLANEOUS) ×3 IMPLANT
CLIP APPLIE 9.375 MED OPEN (MISCELLANEOUS) ×2 IMPLANT
COVER SURGICAL LIGHT HANDLE (MISCELLANEOUS) ×3 IMPLANT
DERMABOND ADVANCED (GAUZE/BANDAGES/DRESSINGS) ×1
DERMABOND ADVANCED .7 DNX12 (GAUZE/BANDAGES/DRESSINGS) ×2 IMPLANT
DEVICE DSSCT PLSMBLD 3.0S LGHT (MISCELLANEOUS) IMPLANT
DRAIN CHANNEL 19F RND (DRAIN) ×6 IMPLANT
DRAPE LAPAROSCOPIC ABDOMINAL (DRAPES) ×3 IMPLANT
DRSG PAD ABDOMINAL 8X10 ST (GAUZE/BANDAGES/DRESSINGS) ×6 IMPLANT
DRSG TEGADERM 4X4.75 (GAUZE/BANDAGES/DRESSINGS) ×6 IMPLANT
ELECT REM PT RETURN 9FT ADLT (ELECTROSURGICAL) ×3
ELECTRODE REM PT RTRN 9FT ADLT (ELECTROSURGICAL) ×2 IMPLANT
EVACUATOR SILICONE 100CC (DRAIN) ×6 IMPLANT
GAUZE SPONGE 4X4 12PLY STRL (GAUZE/BANDAGES/DRESSINGS) ×3 IMPLANT
GLOVE SURG ENC MOIS LTX SZ7.5 (GLOVE) ×3 IMPLANT
GOWN STRL REUS W/ TWL LRG LVL3 (GOWN DISPOSABLE) ×4 IMPLANT
GOWN STRL REUS W/TWL LRG LVL3 (GOWN DISPOSABLE) ×2
KIT BASIN OR (CUSTOM PROCEDURE TRAY) ×3 IMPLANT
KIT TURNOVER KIT B (KITS) ×3 IMPLANT
NS IRRIG 1000ML POUR BTL (IV SOLUTION) ×3 IMPLANT
PACK GENERAL/GYN (CUSTOM PROCEDURE TRAY) ×3 IMPLANT
PAD ARMBOARD 7.5X6 YLW CONV (MISCELLANEOUS) ×5 IMPLANT
PLASMABLADE 3.0S W/LIGHT (MISCELLANEOUS) ×3
SPECIMEN JAR X LARGE (MISCELLANEOUS) ×4 IMPLANT
SUT ETHILON 3 0 FSL (SUTURE) ×6 IMPLANT
SUT MNCRL AB 4-0 PS2 18 (SUTURE) ×3 IMPLANT
SUT VIC AB 3-0 SH 18 (SUTURE) ×5 IMPLANT
TOWEL GREEN STERILE (TOWEL DISPOSABLE) ×3 IMPLANT
TOWEL GREEN STERILE FF (TOWEL DISPOSABLE) ×3 IMPLANT
TUBE CONNECTING 12X1/4 (SUCTIONS) ×1 IMPLANT

## 2022-01-01 NOTE — Interval H&P Note (Signed)
History and Physical Interval Note:  01/01/2022 10:12 AM  Sheena Mcdaniel  has presented today for surgery, with the diagnosis of LEFT BREAST CANCER.  The various methods of treatment have been discussed with the patient and family. After consideration of risks, benefits and other options for treatment, the patient has consented to  Procedure(s): LEFT MODIFIED RADICAL MASTECTOMY (Left) RIGHT TOTAL MASTECTOMY (Right) as a surgical intervention.  The patient's history has been reviewed, patient examined, no change in status, stable for surgery.  I have reviewed the patient's chart and labs.  Questions were answered to the patient's satisfaction.     [name]

## 2022-01-01 NOTE — Anesthesia Preprocedure Evaluation (Signed)
Anesthesia Evaluation  Patient identified by MRN, date of birth, ID band Patient awake    Reviewed: Allergy & Precautions, NPO status , Patient's Chart, lab work & pertinent test results  Airway Mallampati: II  TM Distance: >3 FB     Dental   Pulmonary former smoker,    breath sounds clear to auscultation       Cardiovascular negative cardio ROS   Rhythm:Regular Rate:Normal     Neuro/Psych    GI/Hepatic negative GI ROS, Neg liver ROS,   Endo/Other    Renal/GU negative Renal ROS     Musculoskeletal   Abdominal   Peds  Hematology negative hematology ROS (+)   Anesthesia Other Findings   Reproductive/Obstetrics                             Anesthesia Physical Anesthesia Plan  ASA: 2  Anesthesia Plan: General   Post-op Pain Management:    Induction: Intravenous  PONV Risk Score and Plan: 4 or greater and Ondansetron, Dexamethasone and Midazolam  Airway Management Planned: Oral ETT  Additional Equipment:   Intra-op Plan:   Post-operative Plan: Possible Post-op intubation/ventilation  Informed Consent: I have reviewed the patients History and Physical, chart, labs and discussed the procedure including the risks, benefits and alternatives for the proposed anesthesia with the patient or authorized representative who has indicated his/her understanding and acceptance.     Dental advisory given  Plan Discussed with: CRNA  Anesthesia Plan Comments:         Anesthesia Quick Evaluation

## 2022-01-01 NOTE — H&P (Signed)
PROVIDER: Landry Corporal, MD  MRN: [id number] DOB: 1971/06/06 Subjective   Chief Complaint: Preoperative visit   History of Present Illness: Sheena Mcdaniel is a 51 y.o. female who is seen today for left breast cancer. The patient is a 51 year old white female who was originally diagnosed with a 10 cm left breast cancer with multiple abnormal lymph nodes back in June. The cancer was a triple negative with a Ki-67 of 60%. She has undergone neoadjuvant chemotherapy and has had a good response. The cancer now measures 2.9 cm and the lymph nodes all looked normal. She is now ready to schedule her definitive surgery. Her last dose of chemotherapy was the first week of December  Review of Systems: A complete review of systems was obtained from the patient. I have reviewed this information and discussed as appropriate with the patient. See HPI as well for other ROS.  ROS   Medical History: Past Medical History:  Diagnosis Date   History of cancer   Patient Active Problem List  Diagnosis   Malignant neoplasm of upper-outer quadrant of left breast in female, estrogen receptor negative (CMS-HCC)   Past Surgical History:  Procedure Laterality Date   INSERTION CENTRAL VENOUS ACCESS DEVICE W/ SUBCUTANEOUS PORT N/A    No Known Allergies  No current outpatient medications on file prior to visit.   No current facility-administered medications on file prior to visit.   Family History  Problem Relation Age of Onset   Hyperlipidemia (Elevated cholesterol) Mother   High blood pressure (Hypertension) Mother   Skin cancer Mother   Hyperlipidemia (Elevated cholesterol) Father   High blood pressure (Hypertension) Father    Social History   Tobacco Use  Smoking Status Never  Smokeless Tobacco Never    Social History   Socioeconomic History   Marital status: Married  Tobacco Use   Smoking status: Never   Smokeless tobacco: Never  Substance and Sexual Activity   Alcohol use: Yes    Drug use: Never   Objective:   Vitals:  Pulse: (!) 133  Temp: 36.6 C (97.9 F)  SpO2: 96%  Weight: 70.8 kg (156 lb)  Height: 162.6 cm (5' 4" )   Body mass index is 26.78 kg/m.  Physical Exam Vitals reviewed.  Constitutional:  General: She is not in acute distress. Appearance: Normal appearance.  HENT:  Head: Normocephalic and atraumatic.  Right Ear: External ear normal.  Left Ear: External ear normal.  Nose: Nose normal.  Mouth/Throat:  Mouth: Mucous membranes are moist.  Pharynx: Oropharynx is clear.  Eyes:  General: No scleral icterus. Extraocular Movements: Extraocular movements intact.  Conjunctiva/sclera: Conjunctivae normal.  Pupils: Pupils are equal, round, and reactive to light.  Cardiovascular:  Rate and Rhythm: Normal rate and regular rhythm.  Pulses: Normal pulses.  Heart sounds: Normal heart sounds.  Pulmonary:  Effort: Pulmonary effort is normal. No respiratory distress.  Breath sounds: Normal breath sounds.  Abdominal:  General: Bowel sounds are normal.  Palpations: Abdomen is soft.  Tenderness: There is no abdominal tenderness.  Musculoskeletal:  General: No swelling, tenderness or deformity. Normal range of motion.  Cervical back: Normal range of motion and neck supple.  Skin: General: Skin is warm and dry.  Coloration: Skin is not jaundiced.  Neurological:  General: No focal deficit present.  Mental Status: She is alert and oriented to person, place, and time.  Psychiatric:  Mood and Affect: Mood normal.  Behavior: Behavior normal.     Breast: There is no palpable mass in either  breast. There is no palpable axillary, supraclavicular, or cervical lymphadenopathy.  Labs, Imaging and Diagnostic Testing:  Assessment and Plan:  Diagnoses and all orders for this visit:  Malignant neoplasm of upper-outer quadrant of left breast in female, estrogen receptor negative (CMS-HCC) - [facility] Case Posting Request; Future    The patient appears to  have a locally advanced left breast cancer that has had a good response to neoadjuvant chemotherapy. We have discussed the different options today and at this point she has agreed to a left modified radical mastectomy because of the original number of lymph nodes that were abnormal as well as a right simple mastectomy. I have discussed with her in detail the risks and benefits of the operation as well as some of the technical aspects and she understands and wishes to proceed. She is not interested in reconstruction at this time.

## 2022-01-01 NOTE — Anesthesia Postprocedure Evaluation (Signed)
Anesthesia Post Note  Patient: Sheena Mcdaniel  Procedure(s) Performed: LEFT MODIFIED RADICAL MASTECTOMY (Left: Breast) RIGHT TOTAL MASTECTOMY (Right: Breast)     Patient location during evaluation: PACU Anesthesia Type: General Level of consciousness: awake and alert Pain management: pain level controlled Vital Signs Assessment: post-procedure vital signs reviewed and stable Respiratory status: spontaneous breathing, nonlabored ventilation, respiratory function stable and patient connected to nasal cannula oxygen Cardiovascular status: blood pressure returned to baseline and stable Postop Assessment: no apparent nausea or vomiting Anesthetic complications: no   No notable events documented.  Last Vitals:  Vitals:   01/01/22 1650 01/01/22 1705  BP: 123/70 124/69  Pulse: 64 71  Resp: 17 16  Temp:    SpO2: 100% 100%    Last Pain:  Vitals:   01/01/22 1705  TempSrc:   PainSc: 6                  Isak Sotomayor S

## 2022-01-01 NOTE — Anesthesia Procedure Notes (Signed)
Procedure Name: Intubation Date/Time: 01/01/2022 12:48 PM Performed by: Carolan Clines, CRNA Pre-anesthesia Checklist: Patient identified, Emergency Drugs available, Suction available and Patient being monitored Patient Re-evaluated:Patient Re-evaluated prior to induction Oxygen Delivery Method: Circle System Utilized Preoxygenation: Pre-oxygenation with 100% oxygen Induction Type: IV induction Ventilation: Mask ventilation without difficulty Laryngoscope Size: Mac and 3 Grade View: Grade I Tube type: Oral Tube size: 7.5 mm Number of attempts: 1 Airway Equipment and Method: Stylet Placement Confirmation: ETT inserted through vocal cords under direct vision, positive ETCO2 and breath sounds checked- equal and bilateral Secured at: 22 cm Tube secured with: Tape Dental Injury: Teeth and Oropharynx as per pre-operative assessment

## 2022-01-01 NOTE — Transfer of Care (Signed)
Immediate Anesthesia Transfer of Care Note  Patient: Sheena Mcdaniel  Procedure(s) Performed: LEFT MODIFIED RADICAL MASTECTOMY (Left: Breast) RIGHT TOTAL MASTECTOMY (Right: Breast)  Patient Location: PACU  Anesthesia Type:General  Level of Consciousness: drowsy  Airway & Oxygen Therapy: Patient Spontanous Breathing and Patient connected to face mask oxygen  Post-op Assessment: Report given to RN and Post -op Vital signs reviewed and stable  Post vital signs: Reviewed and stable  Last Vitals:  Vitals Value Taken Time  BP 134/82 01/01/22 1536  Temp    Pulse 73 01/01/22 1538  Resp 16 01/01/22 1538  SpO2 100 % 01/01/22 1538  Vitals shown include unvalidated device data.  Last Pain:  Vitals:   01/01/22 0946  TempSrc:   PainSc: 0-No pain         Complications: No notable events documented.

## 2022-01-01 NOTE — Op Note (Signed)
01/01/2022  3:20 PM  PATIENT:  Sheena Mcdaniel  51 y.o. female  PRE-OPERATIVE DIAGNOSIS:  LEFT BREAST CANCER  POST-OPERATIVE DIAGNOSIS:  LEFT BREAST CANCER  PROCEDURE:  Procedure(s): LEFT MODIFIED RADICAL MASTECTOMY (Left) RIGHT TOTAL MASTECTOMY (Right)  SURGEON:  Surgeon(s) and Role:    Jovita Kussmaul, MD - Primary  PHYSICIAN ASSISTANT:   ASSISTANTS: Pryor Curia, RNFA   ANESTHESIA:   local and general  EBL:  50 mL   BLOOD ADMINISTERED:none  DRAINS: (2) Blake drain(s) in the prepectoral space    LOCAL MEDICATIONS USED:  MARCAINE     SPECIMEN:  Source of Specimen:  right mastectomy, left mastectomy with axillary contents  DISPOSITION OF SPECIMEN:  PATHOLOGY  COUNTS:  YES  TOURNIQUET:  * No tourniquets in log *  DICTATION: .Dragon Dictation  After informed consent was obtained the patient was brought to the operating room and placed in the supine position on the operating table.  After adequate induction of general anesthesia the patient's bilateral chest, breast, and axillary areas were prepped with ChloraPrep, allowed to dry, and draped in usual sterile manner.  An appropriate timeout was performed.  Attention was first turned to the right breast.  An elliptical incision was made around the nipple and areolar complex in order to minimize the excess skin with a 10 blade knife.  The incision was carried through the skin and subcutaneous tissue sharply with the PlasmaBlade.  Breast hooks were used to elevate the skin flaps anteriorly towards the saline.  Thin skin flaps were then created by dissecting between the breast tissue and the subcutaneous fat and skin.  This dissection was carried circumferentially all the way to the chest wall.  Next the breast was removed from the pectoralis muscle with the pectoralis fashion.  Once this was accomplished the entire right breast was removed from the patient.  It was marked with a stitch on the lateral skin and sent to pathology  for further evaluation.  Hemostasis was achieved using the PlasmaBlade.  The wound was irrigated with saline and the pectoral nerve area was blocked with 10 cc of Marcaine.  Next a small stab incision was made near the anterior axillary line inferior to the operative bed.  A tonsil clamp was placed through the opening and used to bring a 19 [place] round Blake drain into the operative bed.  The drain was anchored to the skin with a 3-0 nylon stitch.  The drain was curled along the chest wall.  Next the superior and inferior skin flaps were grossly reapproximated with interrupted 3-0 Vicryl stitches.  The skin was then closed with a running 4-0 Monocryl subcuticular stitch.  Attention was then turned to the left breast.  A similar elliptical incision was made with a 10 blade knife in order to minimize the excess skin.  The incision was carried through the skin and subcutaneous tissue sharply with the PlasmaBlade.  Breast hooks were used to elevate the skin flaps anteriorly towards the ceiling.  Thin skin flaps were then created by dissecting between the breast tissue and the subcutaneous fat.  This dissection was carried circumferentially all the way to the chest wall.  Laterally we were able to identify the latissimus muscle at the posterior aspect of the axilla.  Next the breast was removed from the pectoralis muscle with the pectoralis fashion.  As this dissection was carried laterally along the chest wall we were able to identify the serratus muscle medially in the axilla.  Superiorly we  were able to identify the axillary vein.  The contents of the axilla within these boundaries was then dissected out by blunt right angle dissection.  Several small vessels and intercostal brachial nerves were controlled with clips.  The long thoracic and thoracodorsal nerves were identified and spared.  Once this was accomplished the entire left breast and axillary contents were removed from the patient completing the modified  radical mastectomy.  Hemostasis was achieved using the PlasmaBlade.  The pectoral nerve area was blocked with 10 cc of quarter percent Marcaine.  The wound was irrigated with saline.  A small stab incision was made near the anterior axillary line inferior to the operative bed.  A tonsil clamp was placed through the opening and used to bring a 19 [place] round Blake drain into the operative bed.  The drain was anchored to the skin with a 3-0 nylon stitch.  The drain was curled along the chest wall.  The superior and inferior skin flaps were then grossly reapproximated with interrupted 3-0 Vicryl stitches.  The skin was then closed with running 4-0 Monocryl subcuticular stitches.  Dermabond dressings and sterile drain dressings were applied.  The drains were placed to bulb suction and there was a good seal.  At the end of the case all needle sponge and instrument counts were correct.  The patient tolerated the procedure well.  The patient was then awakened and taken to recovery in stable condition.  The skin flaps looked healthy.  PLAN OF CARE: Admit for overnight observation  PATIENT DISPOSITION:  PACU - hemodynamically stable.   Delay start of Pharmacological VTE agent (>24hrs) due to surgical blood loss or risk of bleeding: no

## 2022-01-02 ENCOUNTER — Encounter (HOSPITAL_COMMUNITY): Payer: Self-pay | Admitting: General Surgery

## 2022-01-02 DIAGNOSIS — N6032 Fibrosclerosis of left breast: Secondary | ICD-10-CM | POA: Diagnosis not present

## 2022-01-02 NOTE — Plan of Care (Signed)

## 2022-01-02 NOTE — Discharge Instructions (Signed)
[facility] [phone]  MASTECTOMY: POST OP INSTRUCTIONS  Always review your discharge instruction sheet given to you by the facility where your surgery was performed. IF YOU HAVE DISABILITY OR FAMILY LEAVE FORMS, YOU MUST BRING THEM TO THE OFFICE FOR PROCESSING.   DO NOT GIVE THEM TO YOUR DOCTOR. A prescription for pain medication may be given to you upon discharge.  Take your pain medication as prescribed, if needed.  If narcotic pain medicine is not needed, then you may take acetaminophen (Tylenol) or ibuprofen (Advil) as needed. Take your usually prescribed medications unless otherwise directed. If you need a refill on your pain medication, please contact your pharmacy.  They will contact our office to request authorization.  Prescriptions will not be filled after 5pm or on week-ends. You should follow a light diet the first few days after arrival home, such as soup and crackers, etc.  Resume your normal diet the day after surgery. Most patients will experience some swelling and bruising on the chest and underarm.  Ice packs will help.  Swelling and bruising can take several days to resolve.  It is common to experience some constipation if taking pain medication after surgery.  Increasing fluid intake and taking a stool softener (such as Colace) will usually help or prevent this problem from occurring.  A mild laxative (Milk of Magnesia or Miralax) should be taken according to package instructions if there are no bowel movements after 48 hours. Unless discharge instructions indicate otherwise, leave your bandage dry and in place until your next appointment in 3-5 days.  You may take a limited sponge bath.  No tube baths or showers until the drains are removed.  You may have steri-strips (small skin tapes) in place directly over the incision.  These strips should be left on the skin for 7-10 days.  If your surgeon used skin glue on the incision, you may shower in 24 hours.   The glue will flake off over the next 2-3 weeks.  Any sutures or staples will be removed at the office during your follow-up visit. DRAINS:  If you have drains in place, it is important to keep a list of the amount of drainage produced each day in your drains.  Before leaving the hospital, you should be instructed on drain care.  Call our office if you have any questions about your drains. ACTIVITIES:  You may resume regular (light) daily activities beginning the next day--such as daily self-care, walking, climbing stairs--gradually increasing activities as tolerated.  You may have sexual intercourse when it is comfortable.  Refrain from any heavy lifting or straining until approved by your doctor. You may drive when you are no longer taking prescription pain medication, you can comfortably wear a seatbelt, and you can safely maneuver your car and apply brakes. RETURN TO WORK:  __________________________________________________________ You should see your doctor in the office for a follow-up appointment approximately 3-5 days after your surgery.  Your doctor's nurse will typically make your follow-up appointment when she calls you with your pathology report.  Expect your pathology report 2-3 business days after your surgery.  You may call to check if you do not hear from us after three days.   OTHER INSTRUCTIONS: ______________________________________________________________________________________________ ____________________________________________________________________________________________ WHEN TO CALL YOUR DOCTOR: Fever over 101.0 Nausea and/or vomiting Extreme swelling or bruising Continued bleeding from incision. Increased pain, redness, or drainage from the incision. The clinic staff is available to answer your questions during regular business hours.  Please don't hesitate   to call and ask to speak to one of the nurses for clinical concerns.  If you have a medical emergency, go to the  nearest emergency room or call 911.  A surgeon from [facility] is always on call at the hospital. [address], [address], [place], [place]  [address] ? [address] [address], [place], [place]   [address] [phone] ? [phone] ? FAX [phone] Web site: [email or web address]  

## 2022-01-02 NOTE — Discharge Summary (Signed)
Physician Discharge Summary  Sheena Mcdaniel LKG:[id number] DOB: 1971/07/21 DOA: 01/01/2022  PCP: Mike Craze, DO  Admit date: 01/01/2022 Discharge date: 01/02/2022  Recommendations for Outpatient Follow-up:   (include homehealth, outpatient follow-up instructions, specific recommendations for PCP to follow-up on, etc.)   Follow-up Information     [name], MD Follow up in 2 week(s).   Specialty: General Surgery Contact information: [address] [place] [address] [phone]                Discharge Diagnoses:  Principal Problem:   Cancer of left female breast [facility])   Surgical Procedure: bilateral mastectomy  Discharge Condition: Good Disposition: Home  Diet recommendation: reg diet   Hospital Course:  51 yo female underwent left mod radical mastectomy and right total mastectomy for breast cancer. Post op she was observed on the floor. She did well and had minimal pain. She was discharged home POD 1.  Discharge Instructions  Discharge Instructions     Diet - low sodium heart healthy   Complete by: As directed    Diet general   Complete by: As directed    Discharge instructions   Complete by: As directed    Sponge bathe while drains are in. No overhead activity. Empty drains, record output, recharge bulb daily   Discharge wound care:   Complete by: As directed    Sponge bath while drains are in. Empty drains and recharge drains daily, record output   Increase activity slowly   Complete by: As directed    Increase activity slowly   Complete by: As directed       Allergies as of 01/02/2022   No Known Allergies      Medication List     STOP taking these medications    lidocaine-prilocaine cream Commonly known as: EMLA   LORazepam 0.5 MG tablet Commonly known as: Ativan   ondansetron 8 MG tablet Commonly known as: Zofran   prochlorperazine 10 MG tablet Commonly known as: COMPAZINE   traZODone 50 MG tablet Commonly  known as: DESYREL       TAKE these medications    gabapentin 100 MG capsule Commonly known as: Neurontin Take 1 capsule (100 mg total) by mouth 3 (three) times daily as needed.   HYDROcodone-acetaminophen 5-325 MG tablet Commonly known as: NORCO/VICODIN Take 1 tablet by mouth every 6 (six) hours as needed for moderate pain or severe pain.   methocarbamol 500 MG tablet Commonly known as: Robaxin Take 1 tablet (500 mg total) by mouth every 6 (six) hours as needed for muscle spasms.               Discharge Care Instructions  (From admission, onward)           Start     Ordered   01/02/22 0000  Discharge wound care:       Comments: Sponge bath while drains are in. Empty drains and recharge drains daily, record output   01/02/22 0845            Follow-up Information     [name], MD Follow up in 2 week(s).   Specialty: General Surgery Contact information: [place] [address] [phone]                  The results of significant diagnostics from this hospitalization (including imaging, microbiology, ancillary and laboratory) are listed below for reference.    Significant Diagnostic  Studies: No results found.  Labs: Basic Metabolic Panel: Recent Labs  Lab 12/30/21 0941  NA 138  K 3.4*  CL 101  CO2 29  GLUCOSE 101*  BUN 10  CREATININE 0.87  CALCIUM 9.6   Liver Function Tests: No results for input(s): AST, ALT, ALKPHOS, BILITOT, PROT, ALBUMIN in the last 168 hours.  CBC: Recent Labs  Lab 12/30/21 0941  WBC 4.8  HGB 10.3*  HCT 33.1*  MCV 95.4  PLT 331    CBG: No results for input(s): GLUCAP in the last 168 hours.  Principal Problem:   Cancer of left female breast [facility])   Time coordinating discharge: 15 min

## 2022-01-04 LAB — SURGICAL PATHOLOGY

## 2022-01-07 ENCOUNTER — Encounter: Payer: Self-pay | Admitting: *Deleted

## 2022-01-11 NOTE — Assessment & Plan Note (Signed)
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%, HER2 negative Right breast: 0.4 cm mass at 9:30 position: Benign on biopsy  Treatment plan: 1.Neoadjuvant chemotherapy with Adriamycin and Cytoxan along with pembrolizumab followed by Taxol carboplatin and pembrolizumab (pembrolizumab maintenance for 1 year) 2.mastectomy withaxillary lymph nodedissection 3.Adjuvant radiation 4.Followed by adjuvant antiestrogen therapy (since she is 10% ER positive) ------------------------------------------------------------------------------------------------------------------------------------------------------ 05/20/2021 echocardiogram EF 60 to 65% 05/20/2021: CT CAP: Large left breast mass with deep involvement of left pectoralis muscle and overlying skin thickening, mild left axillary adenopathy. No metastatic disease elsewhere 05/21/2021: Bone scan: No bone metastases 05/28/2021: Breast MRI: Large mixed cystic and solid enhancing mass involving outer left breast 10 x 7.3 x 8.5 cm posterior involving pectoralis muscle extends to the nipple mass and enhancement together 15 cm. Left axillary lymphadenopathy. ------------------------------------------------------------------------------------------------------- Current Treatment;completed 4 cycles ofAdriamycin, Cytoxan and Pembrolizumab, Today is cycle12Taxol Drucilla Schmidt 3 weeks)  Bilateral Mastectomies: No residual cancer, 0/11 LN neg  .Pathology counseling: I discussed the final pathology report of the patient provided  a copy of this report. I discussed the margins as well as lymph node surgeries. We also discussed the final staging along with previously performed ER/PR and HER-2/neu testing.

## 2022-01-11 NOTE — Progress Notes (Signed)
Patient Care Team: Mike Craze, DO as PCP - General (Internal Medicine) Mauro Kaufmann, RN as Oncology Nurse Navigator Rockwell Germany, RN as Oncology Nurse Navigator  DIAGNOSIS:    ICD-10-CM   1. Malignant neoplasm of upper-outer quadrant of left breast in female, estrogen receptor positive ([place])  C50.412    Z17.0       SUMMARY OF ONCOLOGIC HISTORY: Oncology History  Malignant neoplasm of upper-outer quadrant of left breast in female, estrogen receptor positive ([place])  05/06/2021 Initial Diagnosis   Large circumscribed multicystic mass left breast 9 to 10 cm with multiple left axillary lymph nodes with cortical thickening: biopsy 3 o'clock position: Grade 2 IDC, ER 10%, PR 0%, Ki-67 60%, HER2 negative Right breast: 0.4 cm mass at 9:30 position: Benign on biopsy   05/12/2021 Cancer Staging   Staging form: Breast, AJCC 8th Edition - Clinical stage from 05/12/2021: Stage IIIB (cT3, cN1, cM0, G2, ER-, PR-, HER2-) - Signed by Nicholas Lose, MD on 05/12/2021 Stage prefix: Initial diagnosis Histologic grading system: 3 grade system    05/30/2021 -  Chemotherapy   Patient is on Treatment Plan : BREAST Pembrolizumab + AC q21d x 4 cycles followed by Pembrolizumab + Carboplatin D1 + Paclitaxel D1,8,15 q21d X 4 cycles       Surgery   Bilateral mastectomies: No residual cancer 0/11 LN Neg     CHIEF COMPLIANT: Follow-up of breat cancer  INTERVAL HISTORY: Sheena Mcdaniel is a 51 y.o. with above-mentioned history of breast cancer having undergone neoadjuvant chemotherapy with Taxol carbo along with pembrolizumab. Bilateral mastectomies on 01/01/2022 showed no evidence of malignancy in right mastectomy, and no residual carcinoma in left mastectomy with 11 lymph nodes negative for metastatic carcinoma. She presents to the clinic today for treatment.  She is healing and recovering very well from recent surgery.  Still has tremendous amount of pain.  The amount of drainage from the drains is  coming down.  ALLERGIES:  has No Known Allergies.  MEDICATIONS:  Current Outpatient Medications  Medication Sig Dispense Refill   gabapentin (NEURONTIN) 100 MG capsule Take 1 capsule (100 mg total) by mouth 3 (three) times daily as needed. 60 capsule 3   HYDROcodone-acetaminophen (NORCO/VICODIN) 5-325 MG tablet Take 1 tablet by mouth every 6 (six) hours as needed for moderate pain or severe pain. 15 tablet 0   methocarbamol (ROBAXIN) 500 MG tablet Take 1 tablet (500 mg total) by mouth every 6 (six) hours as needed for muscle spasms. 30 tablet 1   No current facility-administered medications for this visit.    PHYSICAL EXAMINATION: ECOG PERFORMANCE STATUS: 1 - Symptomatic but completely ambulatory  Vitals:   01/12/22 1023  BP: (!) 154/75  Pulse: 77  Resp: 18  Temp: 97.9 F (36.6 C)  SpO2: 100%   Filed Weights   01/12/22 1023  Weight: 152 lb (68.9 kg)       LABORATORY DATA:  I have reviewed the data as listed CMP Latest Ref Rng & Units 12/30/2021 11/27/2021 11/20/2021  Glucose 70 - 99 mg/dL 101(H) 98 118(H)  BUN 6 - 20 mg/dL 10 12 10   Creatinine 0.44 - 1.00 mg/dL 0.87 0.73 0.69  Sodium 135 - 145 mmol/L 138 142 143  Potassium 3.5 - 5.1 mmol/L 3.4(L) 4.1 4.1  Chloride 98 - 111 mmol/L 101 108 109  CO2 22 - 32 mmol/L 29 25 26   Calcium 8.9 - 10.3 mg/dL 9.6 9.2 8.9  Total Protein 6.5 - 8.1 g/dL - 6.6 6.2(L)  Total Bilirubin 0.3 - 1.2 mg/dL - 0.3 0.3  Alkaline Phos 38 - 126 U/L - 75 74  AST 15 - 41 U/L - 41 18  ALT 0 - 44 U/L - 24 20    Lab Results  Component Value Date   WBC 4.8 12/30/2021   HGB 10.3 (L) 12/30/2021   HCT 33.1 (L) 12/30/2021   MCV 95.4 12/30/2021   PLT 331 12/30/2021   NEUTROABS 1.6 (L) 11/27/2021    ASSESSMENT & PLAN:  Malignant neoplasm of upper-outer quadrant of left breast in female, estrogen receptor positive ([place]) 05/06/2021: Large circumscribed multicystic mass left breast 9 to 10 cm with multiple left axillary lymph nodes with cortical  thickening: biopsy 3 o'clock position: Grade 2 IDC, 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  Neoadjuvant chemotherapy with Adriamycin and Cytoxan along with pembrolizumab followed by Taxol carboplatin and pembrolizumab (pembrolizumab maintenance for 1 year) 2. mastectomy with axillary lymph node dissection 3.  Adjuvant radiation 4.  Followed by adjuvant antiestrogen therapy (since she is 10% ER positive) ------------------------------------------------------------------------------------------------------------------------------------------------------ 05/20/2021 echocardiogram EF 60 to 65% 05/20/2021: CT CAP: Large left breast mass with deep involvement of left pectoralis muscle and overlying skin thickening, mild left axillary adenopathy.  No metastatic disease elsewhere 05/21/2021: Bone scan: No bone metastases 05/28/2021: Breast MRI: Large mixed cystic and solid enhancing mass involving outer left breast 10 x 7.3 x 8.5 cm posterior involving pectoralis muscle extends to the nipple mass and enhancement together 15 cm.  Left axillary lymphadenopathy. ------------------------------------------------------------------------------------------------------- Current Treatment; completed 4 cycles of Adriamycin, Cytoxan and Pembrolizumab, Today is cycle 12 [name] Keytruda every 3 weeks)   Bilateral Mastectomies: No residual cancer, 0/11 LN neg  .Pathology counseling: I discussed the final pathology report of the patient provided  a copy of this report. I discussed the margins as well as lymph node surgeries. We also discussed the final staging along with previously performed ER/PR and HER-2/neu testing.  Return to clinic in next week to start pembrolizumab immunotherapy. Radiation oncology will treat her with adjuvant radiation After radiation is completed we will start antiestrogen therapy.   No orders of the  defined types were placed in this encounter.  The patient has a good understanding of the overall plan. she agrees with it. she will call with any problems that may develop before the next visit here.  Total time spent: 30 mins including face to face time and time spent for planning, charting and coordination of care  Rulon Eisenmenger, MD, MPH 01/12/2022  I, Thana Ates, am acting as scribe for Dr. Nicholas Lose.  I have reviewed the above documentation for accuracy and completeness, and I agree with the above.

## 2022-01-12 ENCOUNTER — Other Ambulatory Visit: Payer: Self-pay

## 2022-01-12 ENCOUNTER — Inpatient Hospital Stay: Payer: Commercial Managed Care - HMO | Attending: Hematology and Oncology | Admitting: Hematology and Oncology

## 2022-01-12 VITALS — BP 154/75 | HR 77 | Temp 97.9°F | Resp 18 | Ht 64.0 in | Wt 152.0 lb

## 2022-01-12 DIAGNOSIS — Z79899 Other long term (current) drug therapy: Secondary | ICD-10-CM | POA: Diagnosis not present

## 2022-01-12 DIAGNOSIS — Z5112 Encounter for antineoplastic immunotherapy: Secondary | ICD-10-CM | POA: Diagnosis present

## 2022-01-12 DIAGNOSIS — Z170 Estrogen receptor positive status [ER+]: Secondary | ICD-10-CM | POA: Diagnosis not present

## 2022-01-12 DIAGNOSIS — C50412 Malignant neoplasm of upper-outer quadrant of left female breast: Secondary | ICD-10-CM | POA: Diagnosis present

## 2022-01-12 NOTE — Progress Notes (Signed)
DISCONTINUE ON PATHWAY REGIMEN - Breast     Cycles 1 through 4: A cycle is every 21 days:     Pembrolizumab      Paclitaxel      Carboplatin      Filgrastim-xxxx    Cycles 5 through 8: A cycle is every 21 days:     Pembrolizumab      Doxorubicin      Cyclophosphamide      Pegfilgrastim-xxxx   **Always confirm dose/schedule in your pharmacy ordering system**  REASON: Other Reason PRIOR TREATMENT: [id number]: Pembrolizumab 200 mg D1 + Paclitaxel 80 mg/m2 D1, 8, 15 + Carboplatin AUC=5 D1 q21 Days x 12 Weeks, Followed by Pembrolizumab 200 mg + Doxorubicin + Cyclophosphamide q21 Days x 12 Weeks, Followed by Surgery TREATMENT RESPONSE: Complete Response (CR)  START ON PATHWAY REGIMEN - Breast     A cycle is every 21 days:     Pembrolizumab   **Always confirm dose/schedule in your pharmacy ordering system**  Patient Characteristics: Post-Neoadjuvant Therapy and Resection, HER2 Negative/Unknown/Equivocal, No Residual Disease, ER Negative/Unknown, Adjuvant Therapy - Continuation After Neoadjuvant Therapy Therapeutic Status: Post-Neoadjuvant Therapy and Resection Residual Invasive Disease Post-Neoadjuvant Therapy<= No ER Status: Negative (-) HER2 Status: Negative (-) PR Status: Negative (-) Intent of Therapy: Curative Intent, Discussed with Patient

## 2022-01-12 NOTE — Progress Notes (Signed)
ON PATHWAY REGIMEN - Breast  No Change  Continue With Treatment as Ordered.  Original Decision Date/Time: 05/12/2021 22:38     Cycles 1 through 4: A cycle is every 21 days:     Pembrolizumab      Paclitaxel      Carboplatin      Filgrastim-xxxx    Cycles 5 through 8: A cycle is every 21 days:     Pembrolizumab      Doxorubicin      Cyclophosphamide      Pegfilgrastim-xxxx   **Always confirm dose/schedule in your pharmacy ordering system**  Patient Characteristics: Preoperative or Nonsurgical Candidate (Clinical Staging), Neoadjuvant Therapy followed by Surgery, Invasive Disease, Chemotherapy, HER2 Negative/Unknown/Equivocal, ER Negative/Unknown, Platinum Therapy Indicated, High-Risk Disease Present Therapeutic Status: Preoperative or Nonsurgical Candidate (Clinical Staging) AJCC M Category: cM0 AJCC Grade: G2 Breast Surgical Plan: Neoadjuvant Therapy followed by Surgery ER Status: Negative (-) AJCC 8 Stage Grouping: IIIC HER2 Status: Negative (-) AJCC T Category: cT4b AJCC N Category: cN1 PR Status: Negative (-) Type of Therapy: Platinum Therapy Indicated Intent of Therapy: Curative Intent, Discussed with Patient

## 2022-01-15 ENCOUNTER — Other Ambulatory Visit: Payer: Self-pay | Admitting: Hematology and Oncology

## 2022-01-18 ENCOUNTER — Inpatient Hospital Stay: Payer: Commercial Managed Care - HMO

## 2022-01-20 ENCOUNTER — Inpatient Hospital Stay: Payer: Commercial Managed Care - HMO

## 2022-01-21 ENCOUNTER — Inpatient Hospital Stay: Payer: Commercial Managed Care - HMO

## 2022-01-21 ENCOUNTER — Other Ambulatory Visit: Payer: Self-pay

## 2022-01-21 VITALS — BP 141/89 | HR 98 | Temp 98.8°F | Resp 18 | Wt 151.5 lb

## 2022-01-21 DIAGNOSIS — C50412 Malignant neoplasm of upper-outer quadrant of left female breast: Secondary | ICD-10-CM

## 2022-01-21 DIAGNOSIS — Z5112 Encounter for antineoplastic immunotherapy: Secondary | ICD-10-CM | POA: Diagnosis not present

## 2022-01-21 DIAGNOSIS — Z170 Estrogen receptor positive status [ER+]: Secondary | ICD-10-CM

## 2022-01-21 DIAGNOSIS — Z95828 Presence of other vascular implants and grafts: Secondary | ICD-10-CM

## 2022-01-21 LAB — CBC WITH DIFFERENTIAL (CANCER CENTER ONLY)
Abs Immature Granulocytes: 0.02 10*3/uL (ref 0.00–0.07)
Basophils Absolute: 0.1 10*3/uL (ref 0.0–0.1)
Basophils Relative: 1 %
Eosinophils Absolute: 0.7 10*3/uL — ABNORMAL HIGH (ref 0.0–0.5)
Eosinophils Relative: 9 %
HCT: 29.8 % — ABNORMAL LOW (ref 36.0–46.0)
Hemoglobin: 9.7 g/dL — ABNORMAL LOW (ref 12.0–15.0)
Immature Granulocytes: 0 %
Lymphocytes Relative: 8 %
Lymphs Abs: 0.7 10*3/uL (ref 0.7–4.0)
MCH: 28.4 pg (ref 26.0–34.0)
MCHC: 32.6 g/dL (ref 30.0–36.0)
MCV: 87.4 fL (ref 80.0–100.0)
Monocytes Absolute: 0.7 10*3/uL (ref 0.1–1.0)
Monocytes Relative: 8 %
Neutro Abs: 6.5 10*3/uL (ref 1.7–7.7)
Neutrophils Relative %: 74 %
Platelet Count: 383 10*3/uL (ref 150–400)
RBC: 3.41 MIL/uL — ABNORMAL LOW (ref 3.87–5.11)
RDW: 14 % (ref 11.5–15.5)
WBC Count: 8.7 10*3/uL (ref 4.0–10.5)
nRBC: 0 % (ref 0.0–0.2)

## 2022-01-21 LAB — TSH: TSH: 1.227 u[IU]/mL (ref 0.308–3.960)

## 2022-01-21 LAB — CMP (CANCER CENTER ONLY)
ALT: 13 U/L (ref 0–44)
AST: 17 U/L (ref 15–41)
Albumin: 3.9 g/dL (ref 3.5–5.0)
Alkaline Phosphatase: 64 U/L (ref 38–126)
Anion gap: 6 (ref 5–15)
BUN: 12 mg/dL (ref 6–20)
CO2: 30 mmol/L (ref 22–32)
Calcium: 9.4 mg/dL (ref 8.9–10.3)
Chloride: 103 mmol/L (ref 98–111)
Creatinine: 0.76 mg/dL (ref 0.44–1.00)
GFR, Estimated: >60 mL/min (ref >60)
Glucose, Bld: 114 mg/dL — ABNORMAL HIGH (ref 70–99)
Potassium: 4.1 mmol/L (ref 3.5–5.1)
Sodium: 139 mmol/L (ref 135–145)
Total Bilirubin: 0.3 mg/dL (ref 0.3–1.2)
Total Protein: 6.8 g/dL (ref 6.5–8.1)

## 2022-01-21 MED ORDER — HEPARIN SOD (PORK) LOCK FLUSH 100 UNIT/ML IV SOLN
500.0000 [IU] | Freq: Once | INTRAVENOUS | Status: AC | PRN
  Administered 2022-01-21: 500 [IU]

## 2022-01-21 MED ORDER — SODIUM CHLORIDE 0.9 % IV SOLN
200.0000 mg | Freq: Once | INTRAVENOUS | Status: AC
  Administered 2022-01-21: 200 mg via INTRAVENOUS
  Filled 2022-01-21: qty 200

## 2022-01-21 MED ORDER — SODIUM CHLORIDE 0.9 % IV SOLN: Freq: Once | INTRAVENOUS | Status: AC

## 2022-01-21 MED ORDER — SODIUM CHLORIDE 0.9% FLUSH
10.0000 mL | INTRAVENOUS | Status: DC | PRN
  Administered 2022-01-21: 10 mL

## 2022-01-21 MED ORDER — SODIUM CHLORIDE 0.9% FLUSH
10.0000 mL | Freq: Once | INTRAVENOUS | Status: AC
  Administered 2022-01-21: 10 mL

## 2022-01-22 LAB — T4: T4, Total: 9.6 ug/dL (ref 4.5–12.0)

## 2022-01-25 ENCOUNTER — Ambulatory Visit: Payer: Managed Care, Other (non HMO) | Attending: General Surgery | Admitting: Rehabilitation

## 2022-01-25 ENCOUNTER — Other Ambulatory Visit: Payer: Self-pay

## 2022-01-25 ENCOUNTER — Encounter: Payer: Self-pay | Admitting: Rehabilitation

## 2022-01-25 DIAGNOSIS — R293 Abnormal posture: Secondary | ICD-10-CM

## 2022-01-25 DIAGNOSIS — Z483 Aftercare following surgery for neoplasm: Secondary | ICD-10-CM

## 2022-01-25 DIAGNOSIS — C50412 Malignant neoplasm of upper-outer quadrant of left female breast: Secondary | ICD-10-CM | POA: Insufficient documentation

## 2022-01-25 DIAGNOSIS — Z170 Estrogen receptor positive status [ER+]: Secondary | ICD-10-CM

## 2022-01-25 NOTE — Therapy (Signed)
[facility] @ [address] [place], [place], [address] Phone: [phone]   Fax:  [phone]  Physical Therapy Treatment  Patient Details  Name: Sheena Mcdaniel MRN: [id number] Date of Birth: 1971-11-01 Referring Provider (PT): Dr. Marlou Starks   Encounter Date: 01/25/2022   PT End of Session - 01/25/22 1031     Visit Number 2    Number of Visits 6    Date for PT Re-Evaluation 03/08/22    PT Start Time 5027    PT Stop Time 1026    PT Time Calculation (min) 24 min    Activity Tolerance Patient tolerated treatment well    Behavior During Therapy [facility] for tasks assessed/performed             Past Medical History:  Diagnosis Date   Anxiety    Cancer ([place]) 03/2021   left breast IDC    Past Surgical History:  Procedure Laterality Date   MODIFIED MASTECTOMY Left 01/01/2022   Procedure: LEFT MODIFIED RADICAL MASTECTOMY;  Surgeon: Jovita Kussmaul, MD;  Location: [place];  Service: General;  Laterality: Left;   PORTACATH PLACEMENT N/A 05/22/2021   Procedure: INSERTION PORT-A-CATH;  Surgeon: Jovita Kussmaul, MD;  Location: [facility];  Service: General;  Laterality: N/A;   TOTAL MASTECTOMY Right 01/01/2022   Procedure: RIGHT TOTAL MASTECTOMY;  Surgeon: Jovita Kussmaul, MD;  Location: [facility];  Service: General;  Laterality: Right;   WISDOM TOOTH EXTRACTION      There were no vitals filed for this visit.   Subjective Assessment - 01/25/22 1001     Subjective The Rt side feels fine.  The left side is painful.  I wear the binder.  I go tomorrow to the bra place.    Pertinent History Dx of left breast cancer with multiple abnormal lymph nodes back in June. The cancer was a triple negative with a Ki-67 of 60%. She has undergone neoadjuvant chemotherapy and has had a good response. The cancer now measures 2.9 cm and the lymph nodes all looked normal. bil mastectomy 01/01/22 with 11 lymph nodes removed on left all negative. Drains  out now.  Radiation coming up. Consult on the 7th.    Limitations Lifting    Currently in Pain? No/denies   ususally getting up in the morning               [facility] PT Assessment - 01/25/22 0001       Observation/Other Assessments   Observations bil well healed incisions but signs of infection Lt chest wall into axilla with bright red appearance and tightness of the skin/swelling almost looking a bit firm.  See photo.  Re-eval stopped at this point and nurse was called at central surgery and appt scheduled for 215 today.  Education on most likely treatment with antibiotics and how this was really help the pain and discomfort.                                          PT Long Term Goals - 12/16/21 1354       PT LONG TERM GOAL #1   Title Pt will return to baseline AROM    Time 6    Period Weeks    Status New      PT LONG TERM GOAL #2   Title Pt will be scheduled  for SOZO surveillance    Time 6    Period Weeks    Status New                   Plan - 01/25/22 1034     Clinical Impression Statement Pt arrives to PT re-eval with sings of infection Lt chest.  Re-eval rescheduled to end of next week and appt set today at 215 today with central surgery.    Consulted and Agree with Plan of Care Patient             Patient will benefit from skilled therapeutic intervention in order to improve the following deficits and impairments:     Visit Diagnosis: Malignant neoplasm of upper-outer quadrant of left breast in female, estrogen receptor positive ([place])  Abnormal posture  Aftercare following surgery for neoplasm     Problem List Patient Active Problem List   Diagnosis Date Noted   Cancer of left female breast ([place]) 01/01/2022   Port-A-Cath in place 06/18/2021   Malignant neoplasm of upper-outer quadrant of left breast in female, estrogen receptor positive ([place]) 05/12/2021   Breast pain, left 04/24/2021   Elevated blood  pressure reading 04/24/2021   Perimenopause 04/24/2021    Stark Bray, PT 01/25/2022, 10:35 AM  [facility] @ [address] [place], [place], [address] Phone: [phone]   Fax:  [phone]  Name: Sheena Mcdaniel MRN: [id number] Date of Birth: March 15, 1971

## 2022-01-27 ENCOUNTER — Ambulatory Visit: Payer: Managed Care, Other (non HMO)

## 2022-01-27 ENCOUNTER — Other Ambulatory Visit: Payer: Managed Care, Other (non HMO)

## 2022-02-01 NOTE — Progress Notes (Signed)
Radiation Oncology         [phone]) [phone] ________________________________  Name: Sheena Mcdaniel        MRN: [id number]  Date of Service: 02/02/2022 DOB: 1971/09/28  TK:[id number], Areeg N, DO  Nicholas Lose, MD     REFERRING PHYSICIAN: Nicholas Lose, MD   DIAGNOSIS: The encounter diagnosis was Malignant neoplasm of upper-outer quadrant of left breast in female, estrogen receptor positive ([place]).   HISTORY OF PRESENT ILLNESS: Sheena Mcdaniel is a 51 y.o. female seen at the request of Dr. Lindi Adie for new diagnosis of node positive left breast cancer.  She was originally diagnosed after presenting in the spring 2022 with a large left breast mass.  By diagnostic imaging this measured up to 10 cm with multiple left axillary lymph nodes being abnormally thickened.  A biopsy on 05/06/2021 showed grade 2 invasive ductal carcinoma that was ER 10% positive PR and HER2 negative considered functionally triple negative.  Her nodes were not biopsied at that time but were clinically felt to be positive.  An MRI on 05/27/2021 showed a large mixed cystic and solid mass in the outer left breast measuring up to 10 cm extending posteriorly to involve the subjacent pectoralis and enhancement from the anterior portion of the mass to the level of the nipple measured 5 cm in total the mass and enhancement were 15 cm and multiple level 1 and 2 axillary lymph nodes were present felt to be consistent with metastatic disease.  She began neoadjuvant chemotherapy on 05/30/2021 she completed chemo and immunotherapy on 11/27/2021 and has continued with consolidative Keytruda.  Her most recent MRI on 11/30/2021 showed marked response to therapy with residual enhancement only measuring 2.9 cm and normal-appearing lymph nodes in the left axilla.  She underwent left MRM and right mastectomy on 01/01/2022.  Final pathology revealed fibroadenoma in the right breast specimen and no malignancy.  The left breast showed fibrosis and no residual carcinoma  and 11 lymph nodes were sampled and all were negative for disease.  She is seen to discuss postmastectomy radiotherapy.    PREVIOUS RADIATION THERAPY: No   PAST MEDICAL HISTORY:  Past Medical History:  Diagnosis Date   Anxiety    Cancer ([place]) 03/2021   left breast IDC       PAST SURGICAL HISTORY: Past Surgical History:  Procedure Laterality Date   MODIFIED MASTECTOMY Left 01/01/2022   Procedure: LEFT MODIFIED RADICAL MASTECTOMY;  Surgeon: Jovita Kussmaul, MD;  Location: [facility];  Service: General;  Laterality: Left;   PORTACATH PLACEMENT N/A 05/22/2021   Procedure: INSERTION PORT-A-CATH;  Surgeon: Jovita Kussmaul, MD;  Location: [facility];  Service: General;  Laterality: N/A;   TOTAL MASTECTOMY Right 01/01/2022   Procedure: RIGHT TOTAL MASTECTOMY;  Surgeon: Jovita Kussmaul, MD;  Location: [facility];  Service: General;  Laterality: Right;   WISDOM TOOTH EXTRACTION       FAMILY HISTORY: No family history on file.   SOCIAL HISTORY:  reports that she has quit smoking. She has never used smokeless tobacco. She reports current alcohol use. She reports that she does not use drugs.  The patient is married and lives in [place].  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 They have two adult children, one in [place], and on in college in [place].     ALLERGIES: Patient has no known allergies.   MEDICATIONS:  Current Outpatient Medications  Medication Sig Dispense Refill   gabapentin (NEURONTIN)  100 MG capsule Take 1 capsule (100 mg total) by mouth 3 (three) times daily as needed. 60 capsule 3   HYDROcodone-acetaminophen (NORCO/VICODIN) 5-325 MG tablet Take 1 tablet by mouth every 6 (six) hours as needed for moderate pain or severe pain. 15 tablet 0   methocarbamol (ROBAXIN) 500 MG tablet Take 1 tablet (500 mg total) by mouth every 6 (six) hours as needed for muscle spasms. 30 tablet 1   No current facility-administered medications  for this visit.     REVIEW OF SYSTEMS: On review of systems, the patient reports that she is doing pretty well overall. She states that she continues antibiotics for her local skin cellulitis and that this is the second prescription. Otherwise she felt like she's done well since chemotherapy and surgery. No other complaints are verbalized.      PHYSICAL EXAM:  Wt Readings from Last 3 Encounters:  01/21/22 151 lb 8 oz (68.7 kg)  01/12/22 152 lb (68.9 kg)  01/01/22 157 lb (71.2 kg)   Temp Readings from Last 3 Encounters:  01/21/22 98.8 F (37.1 C) (Oral)  01/12/22 97.9 F (36.6 C) (Temporal)  01/02/22 97.8 F (36.6 C) (Oral)   BP Readings from Last 3 Encounters:  01/21/22 (!) 141/89  01/12/22 (!) 154/75  01/02/22 131/85   Pulse Readings from Last 3 Encounters:  01/21/22 98  01/12/22 77  01/02/22 100    In general this is a well appearing caucasian female in no acute distress. She's alert and oriented x4 and appropriate throughout the examination. Cardiopulmonary assessment is negative for acute distress and she exhibits normal effort. The left mastectomy scar line is well healed with intact dermabond. No erythema or separation is noted.     ECOG = 1  0 - Asymptomatic (Fully active, able to carry on all predisease activities without restriction)  1 - Symptomatic but completely ambulatory (Restricted in physically strenuous activity but ambulatory and able to carry out work of a light or sedentary nature. For example, light housework, office work)  2 - Symptomatic, <50% in bed during the day (Ambulatory and capable of all self care but unable to carry out any work activities. Up and about more than 50% of waking hours)  3 - Symptomatic, >50% in bed, but not bedbound (Capable of only limited self-care, confined to bed or chair 50% or more of waking hours)  4 - Bedbound (Completely disabled. Cannot carry on any self-care. Totally confined to bed or chair)  5 - Death    Eustace Pen MM, Creech RH, Tormey DC, et al. [phone]). "Toxicity and response criteria of the [facility] Group". [place] Oncol. 5 (6): 649-55    LABORATORY DATA:  Lab Results  Component Value Date   WBC 8.7 01/21/2022   HGB 9.7 (L) 01/21/2022   HCT 29.8 (L) 01/21/2022   MCV 87.4 01/21/2022   PLT 383 01/21/2022   Lab Results  Component Value Date   NA 139 01/21/2022   K 4.1 01/21/2022   CL 103 01/21/2022   CO2 30 01/21/2022   Lab Results  Component Value Date   ALT 13 01/21/2022   AST 17 01/21/2022   ALKPHOS 64 01/21/2022   BILITOT 0.3 01/21/2022      RADIOGRAPHY: No results found.     IMPRESSION/PLAN: 1. 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 Dr. Lisbeth Renshaw discusses the pathology findings and reviews the nature of node  positive breast disease.  While she did not have biopsy confirmed disease, her imaging was very suspicious for disease radiographically. Given this, she would benefit from external radiotherapy to the chest wall and regional nodes to reduce risks of local recurrence followed by antiestrogen therapy. We discussed the risks, benefits, short, and long term effects of radiotherapy, as well as the curative intent, and the patient is interested in proceeding. Dr. Lisbeth Renshaw discusses the delivery and logistics of radiotherapy and anticipates a course of 6 1/2 weeks of radiotherapy to the chest wall as well as regional nodes with deep inspiration breath hold technique. Written consent is obtained and placed in the chart, a copy was provided to the patient. She will be contacted by our department to schedule simulation.     In a visit lasting 60 minutes, greater than 50% of the time was spent face to face reviewing her case, as well as in preparation of, discussing, and coordinating the patient's care.  The above documentation reflects my direct  findings during this shared patient visit. Please see the separate note by Dr. Lisbeth Renshaw on this date for the remainder of the patient's plan of care.    Carola Rhine, [facility]    **Disclaimer: This note was dictated with voice recognition software. Similar sounding words can inadvertently be transcribed and this note may contain transcription errors which may not have been corrected upon publication of note.**

## 2022-02-01 NOTE — Progress Notes (Signed)
New Breast Cancer Diagnosis: Left Breast UOQ  Did patient present with symptoms (if so, please note symptoms) or screening mammography?:Screening Mass    Location and Extent of disease :left breast. Located at 3 o'clock position, measured  10 cm in greatest dimension. Adenopathy yes.  Multiple abnormal lymph nodes.   Pathology Report: Bilateral Mastectomy 01/01/2022   Histology per Pathology Report: grade 2, Invasive Mammary Carcinoma 05/06/2021  Receptor Status: ER(negative), PR (negative), Her2-neu (negative), Ki-(60%)        Surgeon and surgical plan, if any:  Dr. Marlou Starks  01/28/2022 -Her drains were removed last week. She then developed cellulitis of the left chest wall that has responded to doxycycline. She also developed some drainage from the lateral portion of the mastectomy incision.  This was packed today.  -She is instructed to remove the packing tomorrow and continue showering. She will cover the area after showering and continue to wear a compression type.  -She will continue the doxycycline for another 10 days until the redness has resolved. I will see her back next week to check her progress  01/01/2022 - The cancer now measures 2.9 cm and the lymph nodes all looked normal. She is now ready to schedule her definitive surgery -Her last dose of chemotherapy was the first week of December. - She is not interested in reconstruction at this time. -Bilateral Mastectomies 01/01/2022- No evidence of malignancy in right mastectomy, and no residual carcinoma in the left mastectomy with 11 lymph nodes negative for metastatic carcinoma.  -Follow-up appointment 02/03/2022.   Medical oncologist, treatment if any:   Dr. Lindi Adie 01/12/2022 Treatment plan: 1.  Neoadjuvant chemotherapy with Adriamycin and Cytoxan along with pembrolizumab followed by Taxol carboplatin and pembrolizumab (pembrolizumab maintenance for 1 year) 2. mastectomy with axillary lymph node dissection 3.  Adjuvant  radiation 4.  Followed by adjuvant antiestrogen therapy (since she is 10% ER positive). Current Treatment; completed 4 cycles of Adriamycin, Cytoxan and Pembrolizumab, Today is cycle 12 [name] Keytruda every 3 weeks)    Family History of Breast/Ovarian/Prostate Cancer: None   Lymphedema issues, if any: Notes some tightness in her arm and some pain.  Will see PT Thursday.  She has been doing her exercises at home.     Pain issues, if any: No    SAFETY ISSUES: Prior radiation? No Pacemaker/ICD? No Possible current pregnancy? Perimenopausal Is the patient on methotrexate? No  Current Complaints / other details:   [employer]

## 2022-02-02 ENCOUNTER — Other Ambulatory Visit: Payer: Self-pay

## 2022-02-02 ENCOUNTER — Ambulatory Visit
Admission: RE | Admit: 2022-02-02 | Discharge: 2022-02-02 | Disposition: A | Discharge status: Home / Self Care | Payer: Managed Care, Other (non HMO) | Source: Ambulatory Visit | Attending: Radiation Oncology | Admitting: Radiation Oncology

## 2022-02-02 ENCOUNTER — Encounter: Payer: Self-pay | Admitting: Radiation Oncology

## 2022-02-02 VITALS — BP 129/78 | HR 72 | Temp 97.6°F | Resp 20 | Ht 64.0 in | Wt 150.2 lb

## 2022-02-02 DIAGNOSIS — Z79899 Other long term (current) drug therapy: Secondary | ICD-10-CM | POA: Insufficient documentation

## 2022-02-02 DIAGNOSIS — Z87891 Personal history of nicotine dependence: Secondary | ICD-10-CM | POA: Insufficient documentation

## 2022-02-02 DIAGNOSIS — Z170 Estrogen receptor positive status [ER+]: Secondary | ICD-10-CM | POA: Insufficient documentation

## 2022-02-02 DIAGNOSIS — C50412 Malignant neoplasm of upper-outer quadrant of left female breast: Secondary | ICD-10-CM

## 2022-02-04 ENCOUNTER — Other Ambulatory Visit: Payer: Self-pay

## 2022-02-04 ENCOUNTER — Encounter: Payer: Self-pay | Admitting: Rehabilitation

## 2022-02-04 ENCOUNTER — Ambulatory Visit: Payer: Commercial Managed Care - HMO | Attending: General Surgery | Admitting: Rehabilitation

## 2022-02-04 DIAGNOSIS — R293 Abnormal posture: Secondary | ICD-10-CM | POA: Insufficient documentation

## 2022-02-04 DIAGNOSIS — M25611 Stiffness of right shoulder, not elsewhere classified: Secondary | ICD-10-CM | POA: Insufficient documentation

## 2022-02-04 DIAGNOSIS — C50412 Malignant neoplasm of upper-outer quadrant of left female breast: Secondary | ICD-10-CM | POA: Insufficient documentation

## 2022-02-04 DIAGNOSIS — Z483 Aftercare following surgery for neoplasm: Secondary | ICD-10-CM | POA: Insufficient documentation

## 2022-02-04 DIAGNOSIS — Z170 Estrogen receptor positive status [ER+]: Secondary | ICD-10-CM | POA: Insufficient documentation

## 2022-02-04 DIAGNOSIS — M25612 Stiffness of left shoulder, not elsewhere classified: Secondary | ICD-10-CM | POA: Insufficient documentation

## 2022-02-04 NOTE — Therapy (Signed)
[facility] @ [address] [place], [place], [address] Phone: [phone]   Fax:  [phone]  Physical Therapy Treatment  Patient Details  Name: Sheena Mcdaniel MRN: [id number] Date of Birth: 06-04-1971 Referring Provider (PT): Dr. Marlou Starks   Encounter Date: 02/04/2022   PT End of Session - 02/04/22 1101     Visit Number 3    Number of Visits 6    Date for PT Re-Evaluation 03/08/22    PT Start Time 1103    PT Stop Time 1156    PT Time Calculation (min) 53 min    Activity Tolerance Patient tolerated treatment well    Behavior During Therapy [facility] for tasks assessed/performed             Past Medical History:  Diagnosis Date   Anxiety    Cancer ([place]) 03/2021   left breast IDC    Past Surgical History:  Procedure Laterality Date   MODIFIED MASTECTOMY Left 01/01/2022   Procedure: LEFT MODIFIED RADICAL MASTECTOMY;  Surgeon: Jovita Kussmaul, MD;  Location: [place];  Service: General;  Laterality: Left;   PORTACATH PLACEMENT N/A 05/22/2021   Procedure: INSERTION PORT-A-CATH;  Surgeon: Jovita Kussmaul, MD;  Location: [place];  Service: General;  Laterality: N/A;   TOTAL MASTECTOMY Right 01/01/2022   Procedure: RIGHT TOTAL MASTECTOMY;  Surgeon: Jovita Kussmaul, MD;  Location: [facility];  Service: General;  Laterality: Right;   WISDOM TOOTH EXTRACTION      There were no vitals filed for this visit.   Subjective Assessment - 02/04/22 1102     Subjective Still on antibiotics.  I have 7 more days left and it feels much better.  I went for my consult for radiation and they want to wait until the infection is all gone for sure    Pertinent History Dx of left breast cancer with multiple abnormal lymph nodes back in June. The cancer was a triple negative with a Ki-67 of 60%. She has undergone neoadjuvant chemotherapy and has had a good response. The cancer now measures 2.9 cm and the lymph nodes all looked normal. bil  mastectomy 01/01/22 with 11 lymph nodes removed on left all negative. Drains out now.  Radiation coming up. Consult on the 7th.    Currently in Pain? No/denies                [facility] PT Assessment - 02/04/22 0001       Assessment   Medical Diagnosis Left breast cancer    Referring Provider (PT) Dr. Marlou Starks    Onset Date/Surgical Date 12/16/21    Hand Dominance Right      Precautions   Precaution Comments lymphedema Lt      Observation/Other Assessments   Observations bil well healed incisions without redness of last visit although still appears tight - cording noted in elbow with abduction AROM measurement      AROM   Right Shoulder Flexion 145 Degrees    Right Shoulder ABduction 155 Degrees    Left Shoulder Flexion 125 Degrees   with cording noted in the elbow   Left Shoulder ABduction 100 Degrees   cording     Palpation   Palpation comment no soft edema noted               LYMPHEDEMA/ONCOLOGY QUESTIONNAIRE - 02/04/22 0001       Type   Cancer Type Lt breast      Right Upper  Extremity Lymphedema   15 cm Proximal to Olecranon Process 31 cm    10 cm Proximal to Olecranon Process 29.5 cm    Olecranon Process 25.5 cm    10 cm Proximal to Ulnar Styloid Process 21.5 cm    Just Proximal to Ulnar Styloid Process 15.5 cm    Across Hand at [employer] 18 cm    At [place] of 2nd Digit 5.9 cm      Left Upper Extremity Lymphedema   15 cm Proximal to Olecranon Process 31 cm    10 cm Proximal to Olecranon Process 30 cm    Olecranon Process 26 cm    10 cm Proximal to Ulnar Styloid Process 21.2 cm    Just Proximal to Ulnar Styloid Process 15.7 cm    Across Hand at [employer] 18 cm    At [place] of 2nd Digit 5.8 cm                        [facility] Adult PT Treatment/Exercise - 02/04/22 0001       Exercises   Exercises Other Exercises    Other Exercises  reviewed HEP and switched seated flexion AAROM and chest stretch to supine with performance of each x 5.   Added median nerve flossing cord stretch to HEP due to new finding of cording      Manual Therapy   Manual Therapy Edema management;Passive ROM;Myofascial release    Edema Management attempted to try sleeve on for Lt UE cording but it was too tender to pull on.  It did fit well on the Rt UE and the measurements were equal today.  Pt will try again in a week or a few days    Myofascial Release gentle release to cording in forearm, upper arm, with pain    Passive ROM into flexion, abduction, ER bil                          PT Long Term Goals - 02/04/22 1210       PT LONG TERM GOAL #1   Title Pt will return to baseline AROM    Status On-going      PT LONG TERM GOAL #2   Title Pt will be scheduled for SOZO surveillance    Status On-going      PT LONG TERM GOAL #3   Title Pt will be ind with starting strengthening to return to gym post breast cancer    Time 4    Period Weeks    Status New                   Plan - 02/04/22 1206     Clinical Impression Statement Pt returns for post op re-eval and start of treatment after being on antibiotics for Lt cellulitis.  Pt has no remaining chest pain and no redness.  Rechecked AROM with cording noted in the Rt UE from axilla to antecubital fossa limiting ROM and causing some noted arm pain during the day. Pt will start PT visits to increase AROM, decrease cording, and return to lifting activiites.    Stability/Clinical Decision Making Stable/Uncomplicated    Clinical Decision Making Low    Rehab Potential Excellent    PT Frequency 2x / week    PT Duration 4 weeks    PT Treatment/Interventions ADLs/Self Care Home Management;Therapeutic exercise;Patient/family education;Manual techniques;Manual lymph drainage;Passive range of motion  PT Next Visit Plan AAROM - pulleys, ball, PROM bil shoulders, cording release Rt UE gentle to start as it is painful/tender,    *Make sure 3 month SOZO is scheduled before DC around 4/6*     PT Home Exercise Plan post op + median nerve cord stretch    Consulted and Agree with Plan of Care Patient             Patient will benefit from skilled therapeutic intervention in order to improve the following deficits and impairments:  Postural dysfunction, Decreased knowledge of precautions, Increased edema, Decreased range of motion, Decreased strength, Impaired UE functional use  Visit Diagnosis: Malignant neoplasm of upper-outer quadrant of left breast in female, estrogen receptor positive ([facility])  Abnormal posture  Aftercare following surgery for neoplasm  Stiffness of left shoulder, not elsewhere classified  Stiffness of right shoulder, not elsewhere classified     Problem List Patient Active Problem List   Diagnosis Date Noted   Port-A-Cath in place 06/18/2021   Malignant neoplasm of upper-outer quadrant of left breast in female, estrogen receptor positive ([place]) 05/12/2021   Breast pain, left 04/24/2021   Elevated blood pressure reading 04/24/2021   Perimenopause 04/24/2021    Stark Bray, PT 02/04/2022, 12:11 PM  [facility] @ [address] [place], [place], [address] Phone: [phone]   Fax:  [phone]  Name: Sheena Mcdaniel MRN: [id number] Date of Birth: 29-Dec-1970

## 2022-02-05 ENCOUNTER — Ambulatory Visit
Admission: RE | Admit: 2022-02-05 | Payer: Commercial Managed Care - HMO | Source: Ambulatory Visit | Admitting: Radiation Oncology

## 2022-02-08 ENCOUNTER — Encounter: Payer: Self-pay | Admitting: *Deleted

## 2022-02-09 ENCOUNTER — Encounter: Payer: Self-pay | Admitting: Rehabilitation

## 2022-02-09 ENCOUNTER — Ambulatory Visit: Payer: Commercial Managed Care - HMO | Admitting: Rehabilitation

## 2022-02-09 ENCOUNTER — Other Ambulatory Visit: Payer: Self-pay

## 2022-02-09 DIAGNOSIS — Z170 Estrogen receptor positive status [ER+]: Secondary | ICD-10-CM

## 2022-02-09 DIAGNOSIS — C50412 Malignant neoplasm of upper-outer quadrant of left female breast: Secondary | ICD-10-CM | POA: Diagnosis not present

## 2022-02-09 DIAGNOSIS — Z483 Aftercare following surgery for neoplasm: Secondary | ICD-10-CM

## 2022-02-09 NOTE — Therapy (Signed)
[facility] @ [place], [place], [address] Phone: [phone]   Fax:  [phone]  Physical Therapy Treatment  Patient Details  Name: Sheena Mcdaniel MRN: [id number] Date of Birth: Feb 26, 1971 Referring Provider (PT): Dr. Marlou Starks   Encounter Date: 02/09/2022   PT End of Session - 02/09/22 1106     Visit Number 4    Number of Visits 6    Date for PT Re-Evaluation 03/08/22    PT Start Time 1105    PT Stop Time 1150    PT Time Calculation (min) 45 min    Activity Tolerance Patient tolerated treatment well    Behavior During Therapy [facility] for tasks assessed/performed             Past Medical History:  Diagnosis Date   Anxiety    Cancer ([place]) 03/2021   left breast IDC    Past Surgical History:  Procedure Laterality Date   MODIFIED MASTECTOMY Left 01/01/2022   Procedure: LEFT MODIFIED RADICAL MASTECTOMY;  Surgeon: Jovita Kussmaul, MD;  Location: [facility];  Service: General;  Laterality: Left;   PORTACATH PLACEMENT N/A 05/22/2021   Procedure: INSERTION PORT-A-CATH;  Surgeon: Jovita Kussmaul, MD;  Location: [place];  Service: General;  Laterality: N/A;   TOTAL MASTECTOMY Right 01/01/2022   Procedure: RIGHT TOTAL MASTECTOMY;  Surgeon: Jovita Kussmaul, MD;  Location: [facility];  Service: General;  Laterality: Right;   WISDOM TOOTH EXTRACTION      There were no vitals filed for this visit.   Subjective Assessment - 02/09/22 1107     Subjective I had to hold on the radiation simulation due to my motion being not enough.  It will be again in 2 days.    Pertinent History Dx of left breast cancer with multiple abnormal lymph nodes back in June. The cancer was a triple negative with a Ki-67 of 60%. She has undergone neoadjuvant chemotherapy and has had a good response. The cancer now measures 2.9 cm and the lymph nodes all looked normal. bil mastectomy 01/01/22 with 11 lymph nodes removed on left all negative. Drains  out now.  Radiation coming up. Consult on the 7th.    Currently in Pain? No/denies                               [facility] Adult PT Treatment/Exercise - 02/09/22 0001       Exercises   Exercises Shoulder      Shoulder Exercises: Pulleys   Flexion 3 minutes    Flexion Limitations with initial cueing    ABduction 3 minutes    ABduction Limitations with initial cueing      Shoulder Exercises: Therapy Ball   Flexion Both;10 reps    Flexion Limitations 5 second hold      Manual Therapy   Myofascial Release release to cording in forearm, upper arm, with less pain today and tolerated in overhead position with pilllow    Passive ROM into flexion, abduction, ER bil                          PT Long Term Goals - 02/04/22 1210       PT LONG TERM GOAL #1   Title Pt will return to baseline AROM    Status On-going      PT LONG TERM GOAL #2  Title Pt will be scheduled for SOZO surveillance    Status On-going      PT LONG TERM GOAL #3   Title Pt will be ind with starting strengthening to return to gym post breast cancer    Time 4    Period Weeks    Status New                   Plan - 02/09/22 1151     Clinical Impression Statement Pt in much less pain today regarding cording and tenderness.  Started more AAROM today and pt was able to tolerate more aggressive cording release and PROM.  Should be okay for sim in 2 days.    PT Frequency 2x / week    PT Duration 4 weeks    PT Treatment/Interventions ADLs/Self Care Home Management;Therapeutic exercise;Patient/family education;Manual techniques;Manual lymph drainage;Passive range of motion    PT Next Visit Plan AAROM - pulleys, ball, PROM bil shoulders, cording release Rt UE,    *Make sure 3 month SOZO is scheduled before DC around 4/6*    Consulted and Agree with Plan of Care Patient             Patient will benefit from skilled therapeutic intervention in order to improve the  following deficits and impairments:     Visit Diagnosis: Malignant neoplasm of upper-outer quadrant of left breast in female, estrogen receptor positive [facility])  Aftercare following surgery for neoplasm     Problem List Patient Active Problem List   Diagnosis Date Noted   Port-A-Cath in place 06/18/2021   Malignant neoplasm of upper-outer quadrant of left breast in female, estrogen receptor positive ([place]) 05/12/2021   Breast pain, left 04/24/2021   Elevated blood pressure reading 04/24/2021   Perimenopause 04/24/2021    Stark Bray, PT 02/09/2022, 11:52 AM  [facility] @ [place], [place], [address] Phone: [phone]   Fax:  [phone]  Name: Sheena Mcdaniel MRN: [id number] Date of Birth: September 12, 1971

## 2022-02-11 ENCOUNTER — Inpatient Hospital Stay: Payer: Commercial Managed Care - HMO

## 2022-02-11 ENCOUNTER — Other Ambulatory Visit: Payer: Managed Care, Other (non HMO)

## 2022-02-11 ENCOUNTER — Ambulatory Visit: Payer: Managed Care, Other (non HMO)

## 2022-02-11 ENCOUNTER — Other Ambulatory Visit: Payer: Self-pay

## 2022-02-11 ENCOUNTER — Ambulatory Visit
Admission: RE | Admit: 2022-02-11 | Discharge: 2022-02-11 | Disposition: A | Discharge status: Home / Self Care | Payer: Commercial Managed Care - HMO | Source: Ambulatory Visit | Attending: Radiation Oncology | Admitting: Radiation Oncology

## 2022-02-11 ENCOUNTER — Ambulatory Visit: Payer: Managed Care, Other (non HMO) | Admitting: Hematology and Oncology

## 2022-02-11 VITALS — BP 130/90 | HR 72 | Temp 98.3°F | Resp 18 | Ht 64.0 in | Wt 150.8 lb

## 2022-02-11 DIAGNOSIS — Z79899 Other long term (current) drug therapy: Secondary | ICD-10-CM | POA: Insufficient documentation

## 2022-02-11 DIAGNOSIS — Z170 Estrogen receptor positive status [ER+]: Secondary | ICD-10-CM | POA: Insufficient documentation

## 2022-02-11 DIAGNOSIS — C50412 Malignant neoplasm of upper-outer quadrant of left female breast: Secondary | ICD-10-CM | POA: Insufficient documentation

## 2022-02-11 DIAGNOSIS — Z5112 Encounter for antineoplastic immunotherapy: Secondary | ICD-10-CM | POA: Insufficient documentation

## 2022-02-11 DIAGNOSIS — Z510 Encounter for antineoplastic radiation therapy: Secondary | ICD-10-CM | POA: Insufficient documentation

## 2022-02-11 DIAGNOSIS — Z95828 Presence of other vascular implants and grafts: Secondary | ICD-10-CM

## 2022-02-11 LAB — CBC WITH DIFFERENTIAL (CANCER CENTER ONLY)
Abs Immature Granulocytes: 0.01 10*3/uL (ref 0.00–0.07)
Basophils Absolute: 0 10*3/uL (ref 0.0–0.1)
Basophils Relative: 1 %
Eosinophils Absolute: 1.7 10*3/uL — ABNORMAL HIGH (ref 0.0–0.5)
Eosinophils Relative: 29 %
HCT: 28.8 % — ABNORMAL LOW (ref 36.0–46.0)
Hemoglobin: 9.1 g/dL — ABNORMAL LOW (ref 12.0–15.0)
Immature Granulocytes: 0 %
Lymphocytes Relative: 19 %
Lymphs Abs: 1.1 10*3/uL (ref 0.7–4.0)
MCH: 26.8 pg (ref 26.0–34.0)
MCHC: 31.6 g/dL (ref 30.0–36.0)
MCV: 84.7 fL (ref 80.0–100.0)
Monocytes Absolute: 0.5 10*3/uL (ref 0.1–1.0)
Monocytes Relative: 8 %
Neutro Abs: 2.5 10*3/uL (ref 1.7–7.7)
Neutrophils Relative %: 43 %
Platelet Count: 291 10*3/uL (ref 150–400)
RBC: 3.4 MIL/uL — ABNORMAL LOW (ref 3.87–5.11)
RDW: 15.1 % (ref 11.5–15.5)
WBC Count: 5.8 10*3/uL (ref 4.0–10.5)
nRBC: 0 % (ref 0.0–0.2)

## 2022-02-11 LAB — CMP (CANCER CENTER ONLY)
ALT: 16 U/L (ref 0–44)
AST: 19 U/L (ref 15–41)
Albumin: 3.9 g/dL (ref 3.5–5.0)
Alkaline Phosphatase: 58 U/L (ref 38–126)
Anion gap: 4 — ABNORMAL LOW (ref 5–15)
BUN: 14 mg/dL (ref 6–20)
CO2: 30 mmol/L (ref 22–32)
Calcium: 9.4 mg/dL (ref 8.9–10.3)
Chloride: 106 mmol/L (ref 98–111)
Creatinine: 0.79 mg/dL (ref 0.44–1.00)
GFR, Estimated: >60 mL/min (ref >60)
Glucose, Bld: 103 mg/dL — ABNORMAL HIGH (ref 70–99)
Potassium: 4 mmol/L (ref 3.5–5.1)
Sodium: 140 mmol/L (ref 135–145)
Total Bilirubin: 0.4 mg/dL (ref 0.3–1.2)
Total Protein: 6.3 g/dL — ABNORMAL LOW (ref 6.5–8.1)

## 2022-02-11 LAB — TSH: TSH: 1.512 u[IU]/mL (ref 0.308–3.960)

## 2022-02-11 MED ORDER — SODIUM CHLORIDE 0.9% FLUSH
10.0000 mL | INTRAVENOUS | Status: DC | PRN
  Administered 2022-02-11: 10 mL

## 2022-02-11 MED ORDER — SODIUM CHLORIDE 0.9 % IV SOLN: Freq: Once | INTRAVENOUS | Status: AC

## 2022-02-11 MED ORDER — HEPARIN SOD (PORK) LOCK FLUSH 100 UNIT/ML IV SOLN
500.0000 [IU] | Freq: Once | INTRAVENOUS | Status: AC | PRN
  Administered 2022-02-11: 500 [IU]

## 2022-02-11 MED ORDER — SODIUM CHLORIDE 0.9 % IV SOLN
200.0000 mg | Freq: Once | INTRAVENOUS | Status: AC
  Administered 2022-02-11: 200 mg via INTRAVENOUS
  Filled 2022-02-11: qty 8

## 2022-02-11 MED ORDER — SODIUM CHLORIDE 0.9% FLUSH
10.0000 mL | Freq: Once | INTRAVENOUS | Status: AC
  Administered 2022-02-11: 10 mL

## 2022-02-11 NOTE — Patient Instructions (Signed)
[facility]  Discharge Instructions: ?Thank you for choosing [facility] to provide your oncology and hematology care.  ? ?If you have a lab appointment with the Cancer Center, please go directly to the Cancer Center and check in at the registration area. ?  ?Wear comfortable clothing and clothing appropriate for easy access to any Portacath or PICC line.  ? ?We strive to give you quality time with your provider. You may need to reschedule your appointment if you arrive late (15 or more minutes).  Arriving late affects you and other patients whose appointments are after yours.  Also, if you miss three or more appointments without notifying the office, you may be dismissed from the clinic at the provider?s discretion.    ?  ?For prescription refill requests, have your pharmacy contact our office and allow 72 hours for refills to be completed.   ? ?Today you received the following chemotherapy and/or immunotherapy agents: Keytruda ?  ?To help prevent nausea and vomiting after your treatment, we encourage you to take your nausea medication as directed. ? ?BELOW ARE SYMPTOMS THAT SHOULD BE REPORTED IMMEDIATELY: ?*FEVER GREATER THAN 100.4 F (38 ?C) OR HIGHER ?*CHILLS OR SWEATING ?*NAUSEA AND VOMITING THAT IS NOT CONTROLLED WITH YOUR NAUSEA MEDICATION ?*UNUSUAL SHORTNESS OF BREATH ?*UNUSUAL BRUISING OR BLEEDING ?*URINARY PROBLEMS (pain or burning when urinating, or frequent urination) ?*BOWEL PROBLEMS (unusual diarrhea, constipation, pain near the anus) ?TENDERNESS IN MOUTH AND THROAT WITH OR WITHOUT PRESENCE OF ULCERS (sore throat, sores in mouth, or a toothache) ?UNUSUAL RASH, SWELLING OR PAIN  ?UNUSUAL VAGINAL DISCHARGE OR ITCHING  ? ?Items with * indicate a potential emergency and should be followed up as soon as possible or go to the Emergency Department if any problems should occur. ? ?Please show the CHEMOTHERAPY ALERT CARD or IMMUNOTHERAPY ALERT CARD at check-in to the  Emergency Department and triage nurse. ? ?Should you have questions after your visit or need to cancel or reschedule your appointment, please contact [facility]  Dept: [phone]  and follow the prompts.  Office hours are 8:00 a.m. to 4:30 p.m. Monday - Friday. Please note that voicemails left after 4:00 p.m. may not be returned until the following business day.  We are closed weekends and major holidays. You have access to a nurse at all times for urgent questions. Please call the main number to the clinic Dept: [phone] and follow the prompts. ? ? ?For any non-urgent questions, you may also contact your provider using MyChart. We now offer e-Visits for anyone 18 and older to request care online for non-urgent symptoms. For details visit [email or web address]. ?  ?Also download the MyChart app! Go to the app store, search "MyChart", open the app, select [facility], and log in with your MyChart username and password. ? ?Due to Covid, a mask is required upon entering the hospital/clinic. If you do not have a mask, one will be given to you upon arrival. For doctor visits, patients may have 1 support person aged 18 or older with them. For treatment visits, patients cannot have anyone with them due to current Covid guidelines and our immunocompromised population.  ? ?

## 2022-02-12 LAB — T4: T4, Total: 9 ug/dL (ref 4.5–12.0)

## 2022-02-15 ENCOUNTER — Other Ambulatory Visit: Payer: Self-pay

## 2022-02-15 ENCOUNTER — Ambulatory Visit: Payer: Commercial Managed Care - HMO

## 2022-02-15 ENCOUNTER — Ambulatory Visit: Payer: Commercial Managed Care - HMO | Admitting: Radiation Oncology

## 2022-02-15 DIAGNOSIS — Z483 Aftercare following surgery for neoplasm: Secondary | ICD-10-CM

## 2022-02-15 DIAGNOSIS — C50412 Malignant neoplasm of upper-outer quadrant of left female breast: Secondary | ICD-10-CM | POA: Diagnosis not present

## 2022-02-15 DIAGNOSIS — R293 Abnormal posture: Secondary | ICD-10-CM

## 2022-02-15 DIAGNOSIS — M25612 Stiffness of left shoulder, not elsewhere classified: Secondary | ICD-10-CM

## 2022-02-15 DIAGNOSIS — M25611 Stiffness of right shoulder, not elsewhere classified: Secondary | ICD-10-CM

## 2022-02-15 DIAGNOSIS — Z170 Estrogen receptor positive status [ER+]: Secondary | ICD-10-CM

## 2022-02-15 NOTE — Therapy (Signed)
[facility] @ [place], [place], [address] Phone: [phone]   Fax:  [phone]  Physical Therapy Treatment  Patient Details  Name: Sheena Mcdaniel MRN: [id number] Date of Birth: [birth year] Referring Provider (PT): Dr. Marlou Starks   Encounter Date: 02/15/2022   PT End of Session - 02/15/22 1418     Visit Number 5    Number of Visits 6    Date for PT Re-Evaluation 03/08/22    PT Start Time 6812    PT Stop Time 1450    PT Time Calculation (min) 46 min    Activity Tolerance Patient tolerated treatment well    Behavior During Therapy [facility] for tasks assessed/performed             Past Medical History:  Diagnosis Date   Anxiety    Cancer ([place]) 03/2021   left breast IDC    Past Surgical History:  Procedure Laterality Date   MODIFIED MASTECTOMY Left 01/01/2022   Procedure: LEFT MODIFIED RADICAL MASTECTOMY;  Surgeon: Jovita Kussmaul, MD;  Location: [facility];  Service: General;  Laterality: Left;   PORTACATH PLACEMENT N/A 05/22/2021   Procedure: INSERTION PORT-A-CATH;  Surgeon: Jovita Kussmaul, MD;  Location: [facility];  Service: General;  Laterality: N/A;   TOTAL MASTECTOMY Right 01/01/2022   Procedure: RIGHT TOTAL MASTECTOMY;  Surgeon: Jovita Kussmaul, MD;  Location: [facility];  Service: General;  Laterality: Right;   WISDOM TOOTH EXTRACTION      There were no vitals filed for this visit.   Subjective Assessment - 02/15/22 1404     Subjective Doing pretty well.  The cording is better since she worked on me last time.  ROM seems to be going well.  Radiation simulation went well and I start Monday, Feb. 27.  I bought some pulleys to use at home but didn't do over the weekend because my children were in town.   Pertinent History Dx of left breast cancer with multiple abnormal lymph nodes back in June. The cancer was a triple negative with a Ki-67 of 60%. She has undergone neoadjuvant chemotherapy and has had a  good response. The cancer now measures 2.9 cm and the lymph nodes all looked normal. bil mastectomy 01/01/22 with 11 lymph nodes removed on left all negative. Drains out now.  Radiation coming up. Consult on the 7th.    Patient Stated Goals get a sleeve and baselines    Currently in Pain? No/denies    Pain Score 0-No pain   unless stretching cords   Multiple Pain Sites No                               [facility] Adult PT Treatment/Exercise - 02/15/22 0001       Shoulder Exercises: Pulleys   Flexion 2 minutes    Flexion Limitations with initial cueing    Scaption 2 minutes    ABduction 2 minutes      Shoulder Exercises: Therapy Ball   Flexion Both;10 reps    Flexion Limitations 5 second hold      Manual Therapy   Myofascial Release MFR to left UE cording with arm overhead and resting on a pillow.    Passive ROM into flexion, abduction, ER bil                          PT  Long Term Goals - 02/04/22 1210       PT LONG TERM GOAL #1   Title Pt will return to baseline AROM    Status On-going      PT LONG TERM GOAL #2   Title Pt will be scheduled for SOZO surveillance    Status On-going      PT LONG TERM GOAL #3   Title Pt will be ind with starting strengthening to return to gym post breast cancer    Time 4    Period Weeks    Status New                   Plan - 02/15/22 1457     Clinical Impression Statement Continued AAROM with pulleys, MFR techniques to cording and PROM of bilateral shoulders.  Pt continues with cording in the axillary region and upper arm, but had good tolerance for release techniques and felt better afterwards. Had her radiation simulation done without difficulty and starts radiation next week.    Stability/Clinical Decision Making Stable/Uncomplicated    Rehab Potential Excellent    PT Frequency 2x / week    PT Duration 4 weeks    PT Treatment/Interventions ADLs/Self Care Home Management;Therapeutic  exercise;Patient/family education;Manual techniques;Manual lymph drainage;Passive range of motion    PT Next Visit Plan AAROM - pulleys, ball, PROM bil shoulders, cording release lt UE, progress to strength when ready    PT Home Exercise Plan post op + median nerve cord stretch, pulleys (pt bought)    Recommended Other Services Soz scheduled    Consulted and Agree with Plan of Care Patient             Patient will benefit from skilled therapeutic intervention in order to improve the following deficits and impairments:  Postural dysfunction, Decreased knowledge of precautions, Increased edema, Decreased range of motion, Decreased strength, Impaired UE functional use  Visit Diagnosis: Malignant neoplasm of upper-outer quadrant of left breast in female, estrogen receptor positive ([place])  Aftercare following surgery for neoplasm  Abnormal posture  Stiffness of left shoulder, not elsewhere classified  Stiffness of right shoulder, not elsewhere classified     Problem List Patient Active Problem List   Diagnosis Date Noted   Port-A-Cath in place 06/18/2021   Malignant neoplasm of upper-outer quadrant of left breast in female, estrogen receptor positive ([place]) 05/12/2021   Breast pain, left 04/24/2021   Elevated blood pressure reading 04/24/2021   Perimenopause 04/24/2021    Claris Pong, PT 02/15/2022, 2:59 PM  [facility] @ [place], [place], [address] Phone: [phone]   Fax:  [phone]  Name: Sheena Mcdaniel MRN: [id number] Date of Birth: 01-06-1971

## 2022-02-16 ENCOUNTER — Ambulatory Visit: Payer: Commercial Managed Care - HMO

## 2022-02-17 ENCOUNTER — Encounter: Payer: Self-pay | Admitting: Rehabilitation

## 2022-02-17 ENCOUNTER — Other Ambulatory Visit: Payer: Self-pay

## 2022-02-17 ENCOUNTER — Ambulatory Visit: Payer: Commercial Managed Care - HMO

## 2022-02-17 ENCOUNTER — Ambulatory Visit: Payer: Commercial Managed Care - HMO | Admitting: Rehabilitation

## 2022-02-17 DIAGNOSIS — Z170 Estrogen receptor positive status [ER+]: Secondary | ICD-10-CM

## 2022-02-17 DIAGNOSIS — Z483 Aftercare following surgery for neoplasm: Secondary | ICD-10-CM

## 2022-02-17 DIAGNOSIS — R293 Abnormal posture: Secondary | ICD-10-CM

## 2022-02-17 DIAGNOSIS — C50412 Malignant neoplasm of upper-outer quadrant of left female breast: Secondary | ICD-10-CM | POA: Diagnosis not present

## 2022-02-17 DIAGNOSIS — M25612 Stiffness of left shoulder, not elsewhere classified: Secondary | ICD-10-CM

## 2022-02-17 DIAGNOSIS — M25611 Stiffness of right shoulder, not elsewhere classified: Secondary | ICD-10-CM

## 2022-02-17 NOTE — Therapy (Signed)
[facility] @ [place], [place], [address] Phone: [phone]   Fax:  [phone]  Physical Therapy Treatment  Patient Details  Name: Amarii Amy MRN: [id number] Date of Birth: [birth year] Referring Provider (PT): Dr. Marlou Starks   Encounter Date: 02/17/2022   PT End of Session - 02/17/22 1108     Visit Number 6    Number of Visits 11    Date for PT Re-Evaluation 03/08/22    PT Start Time 1110   arrived late   PT Stop Time 1153    PT Time Calculation (min) 43 min    Activity Tolerance Patient tolerated treatment well    Behavior During Therapy [facility] for tasks assessed/performed             Past Medical History:  Diagnosis Date   Anxiety    Cancer ([place]) 03/2021   left breast IDC    Past Surgical History:  Procedure Laterality Date   MODIFIED MASTECTOMY Left 01/01/2022   Procedure: LEFT MODIFIED RADICAL MASTECTOMY;  Surgeon: Jovita Kussmaul, MD;  Location: [facility];  Service: General;  Laterality: Left;   PORTACATH PLACEMENT N/A 05/22/2021   Procedure: INSERTION PORT-A-CATH;  Surgeon: Jovita Kussmaul, MD;  Location: [facility];  Service: General;  Laterality: N/A;   TOTAL MASTECTOMY Right 01/01/2022   Procedure: RIGHT TOTAL MASTECTOMY;  Surgeon: Jovita Kussmaul, MD;  Location: [facility];  Service: General;  Laterality: Right;   WISDOM TOOTH EXTRACTION      There were no vitals filed for this visit.   Subjective Assessment - 02/17/22 1109     Subjective Nothing new. I got my pulleys set up now    Pertinent History Dx of left breast cancer with multiple abnormal lymph nodes back in June. The cancer was a triple negative with a Ki-67 of 60%. She has undergone neoadjuvant chemotherapy and has had a good response. The cancer now measures 2.9 cm and the lymph nodes all looked normal. bil mastectomy 01/01/22 with 11 lymph nodes removed on left all negative. Drains out now.  Radiation coming up on 02/22/22.     Currently in Pain? No/denies                               [facility] Adult PT Treatment/Exercise - 02/17/22 0001       Shoulder Exercises: Pulleys   Flexion 2 minutes    ABduction 2 minutes      Shoulder Exercises: ROM/Strengthening   Other ROM/Strengthening Exercises standing childs pose on counter 2x20"    Other ROM/Strengthening Exercises supine open book 5" x 5 to tolerance                          PT Long Term Goals - 02/04/22 1210       PT LONG TERM GOAL #1   Title Pt will return to baseline AROM    Status On-going      PT LONG TERM GOAL #2   Title Pt will be scheduled for SOZO surveillance    Status On-going      PT LONG TERM GOAL #3   Title Pt will be ind with starting strengthening to return to gym post breast cancer    Time 4    Period Weeks    Status New  Patient will benefit from skilled therapeutic intervention in order to improve the following deficits and impairments:     Visit Diagnosis: Malignant neoplasm of upper-outer quadrant of left breast in female, estrogen receptor positive ([place])  Abnormal posture  Stiffness of left shoulder, not elsewhere classified  Aftercare following surgery for neoplasm  Stiffness of right shoulder, not elsewhere classified     Problem List Patient Active Problem List   Diagnosis Date Noted   Port-A-Cath in place 06/18/2021   Malignant neoplasm of upper-outer quadrant of left breast in female, estrogen receptor positive ([place]) 05/12/2021   Breast pain, left 04/24/2021   Elevated blood pressure reading 04/24/2021   Perimenopause 04/24/2021    Stark Bray, PT 02/17/2022, 3:25 PM  [facility] @ [place], [place], [address] Phone: [phone]   Fax:  [phone]  Name: Nikoleta Dady MRN: [id number] Date of Birth: 08-01-1971

## 2022-02-18 ENCOUNTER — Ambulatory Visit: Payer: Managed Care, Other (non HMO) | Admitting: Hematology and Oncology

## 2022-02-18 ENCOUNTER — Other Ambulatory Visit: Payer: Managed Care, Other (non HMO)

## 2022-02-18 ENCOUNTER — Ambulatory Visit: Payer: Commercial Managed Care - HMO

## 2022-02-18 ENCOUNTER — Ambulatory Visit: Payer: Managed Care, Other (non HMO)

## 2022-02-19 ENCOUNTER — Ambulatory Visit: Payer: Commercial Managed Care - HMO

## 2022-02-21 DIAGNOSIS — Z510 Encounter for antineoplastic radiation therapy: Secondary | ICD-10-CM | POA: Diagnosis not present

## 2022-02-22 ENCOUNTER — Other Ambulatory Visit: Payer: Self-pay

## 2022-02-22 ENCOUNTER — Ambulatory Visit
Admission: RE | Admit: 2022-02-22 | Discharge: 2022-02-22 | Disposition: A | Discharge status: Home / Self Care | Payer: Commercial Managed Care - HMO | Source: Ambulatory Visit | Attending: Radiation Oncology | Admitting: Radiation Oncology

## 2022-02-22 ENCOUNTER — Ambulatory Visit: Payer: Commercial Managed Care - HMO

## 2022-02-22 DIAGNOSIS — C50412 Malignant neoplasm of upper-outer quadrant of left female breast: Secondary | ICD-10-CM

## 2022-02-22 DIAGNOSIS — Z510 Encounter for antineoplastic radiation therapy: Secondary | ICD-10-CM | POA: Diagnosis not present

## 2022-02-22 DIAGNOSIS — Z483 Aftercare following surgery for neoplasm: Secondary | ICD-10-CM

## 2022-02-22 DIAGNOSIS — M25612 Stiffness of left shoulder, not elsewhere classified: Secondary | ICD-10-CM

## 2022-02-22 DIAGNOSIS — M25611 Stiffness of right shoulder, not elsewhere classified: Secondary | ICD-10-CM

## 2022-02-22 DIAGNOSIS — R293 Abnormal posture: Secondary | ICD-10-CM

## 2022-02-22 NOTE — Therapy (Signed)
[facility] @ [place], [place], [address] Phone: [phone]   Fax:  [phone]  Physical Therapy Treatment  Patient Details  Name: Sheena Mcdaniel MRN: [id number] Date of Birth: 05/04/1971 Referring Provider (PT): Dr. Marlou Starks   Encounter Date: 02/22/2022   PT End of Session - 02/22/22 1419     Visit Number 7    Number of Visits 11    Date for PT Re-Evaluation 03/08/22    PT Start Time 5852    PT Stop Time 1502    PT Time Calculation (min) 58 min    Activity Tolerance Patient tolerated treatment well    Behavior During Therapy [facility] for tasks assessed/performed             Past Medical History:  Diagnosis Date   Anxiety    Cancer ([place]) 03/2021   left breast IDC    Past Surgical History:  Procedure Laterality Date   MODIFIED MASTECTOMY Left 01/01/2022   Procedure: LEFT MODIFIED RADICAL MASTECTOMY;  Surgeon: Jovita Kussmaul, MD;  Location: [facility];  Service: General;  Laterality: Left;   PORTACATH PLACEMENT N/A 05/22/2021   Procedure: INSERTION PORT-A-CATH;  Surgeon: Jovita Kussmaul, MD;  Location: [facility];  Service: General;  Laterality: N/A;   TOTAL MASTECTOMY Right 01/01/2022   Procedure: RIGHT TOTAL MASTECTOMY;  Surgeon: Jovita Kussmaul, MD;  Location: [facility];  Service: General;  Laterality: Right;   WISDOM TOOTH EXTRACTION      There were no vitals filed for this visit.   Subjective Assessment - 02/22/22 1411     Subjective I had my first radiation appt today and that went well.    Pertinent History Dx of left breast cancer with multiple abnormal lymph nodes back in June. The cancer was a triple negative with a Ki-67 of 60%. She has undergone neoadjuvant chemotherapy and has had a good response. The cancer now measures 2.9 cm and the lymph nodes all looked normal. bil mastectomy 01/01/22 with 11 lymph nodes removed on left all negative. Drains out now.  Radiation coming up on 02/22/22.     Patient Stated Goals get a sleeve and baselines    Currently in Pain? No/denies                               [facility] Adult PT Treatment/Exercise - 02/22/22 0001       Shoulder Exercises: Supine   Horizontal ABduction Strengthening;Both;5 reps;Theraband    Theraband Level (Shoulder Horizontal ABduction) Level 1 (Yellow)    External Rotation Strengthening;Both;5 reps    Theraband Level (Shoulder External Rotation) Level 1 (Yellow)    Flexion Strengthening;Both;5 reps;Theraband   Narrow and Wide Grip, 5x each   Theraband Level (Shoulder Flexion) Level 1 (Yellow)    Diagonals Strengthening;Right;Left;5 reps;Theraband    Theraband Level (Shoulder Diagonals) Level 1 (Yellow)      Shoulder Exercises: Pulleys   Flexion 2 minutes    ABduction 3 minutes    ABduction Limitations VCs and demo to decrease Lt scapualr compensation      Shoulder Exercises: Therapy Ball   Flexion Both;10 reps    Flexion Limitations forward lean into end of stretch      Manual Therapy   Myofascial Release MFR to left UE cording with arm overhead    Passive ROM In Supine to Lt shoulders into flexion, abduction, ER  PT Education - 02/22/22 1437     Education Details Supine scapular series with yellow theraband    Person(s) Educated Patient    Methods Explanation;Demonstration;Handout    Comprehension Verbalized understanding;Returned demonstration                 PT Long Term Goals - 02/04/22 1210       PT LONG TERM GOAL #1   Title Pt will return to baseline AROM    Status On-going      PT LONG TERM GOAL #2   Title Pt will be scheduled for SOZO surveillance    Status On-going      PT LONG TERM GOAL #3   Title Pt will be ind with starting strengthening to return to gym post breast cancer    Time 4    Period Weeks    Status New                   Plan - 02/22/22 1421     Clinical Impression Statement Continued AA/ROM for bil UE  focusing on stretching into end ROM. Pt reports feeling good stretches with these. Then progressed pt to include supine scapular series with yellow theraband. She tolerated this well and was advised to perform these only 3-4 x/wk for now while undergoing radiation and as her cording is just beginning to resolve so we don't want this to flare back up either. Pt able to verbalize good understanding. Then continued with MFR to mild cording still present in Lt UE.             Patient will benefit from skilled therapeutic intervention in order to improve the following deficits and impairments:  Postural dysfunction, Decreased knowledge of precautions, Increased edema, Decreased range of motion, Decreased strength, Impaired UE functional use  Visit Diagnosis: Malignant neoplasm of upper-outer quadrant of left breast in female, estrogen receptor positive ([facility])  Abnormal posture  Stiffness of left shoulder, not elsewhere classified  Aftercare following surgery for neoplasm  Stiffness of right shoulder, not elsewhere classified     Problem List Patient Active Problem List   Diagnosis Date Noted   Port-A-Cath in place 06/18/2021   Malignant neoplasm of upper-outer quadrant of left breast in female, estrogen receptor positive ([place]) 05/12/2021   Breast pain, left 04/24/2021   Elevated blood pressure reading 04/24/2021   Perimenopause 04/24/2021    Otelia Limes, PTA 02/22/2022, 3:02 PM  [facility] @ [place], [place], [address] Phone: [phone]   Fax:  [phone]  Name: Sheena Mcdaniel MRN: [id number] Date of Birth: July 15, 1971

## 2022-02-22 NOTE — Patient Instructions (Signed)

## 2022-02-23 ENCOUNTER — Ambulatory Visit
Admission: RE | Admit: 2022-02-23 | Discharge: 2022-02-23 | Disposition: A | Discharge status: Home / Self Care | Payer: Commercial Managed Care - HMO | Source: Ambulatory Visit | Attending: Radiation Oncology | Admitting: Radiation Oncology

## 2022-02-23 DIAGNOSIS — Z510 Encounter for antineoplastic radiation therapy: Secondary | ICD-10-CM | POA: Diagnosis not present

## 2022-02-24 ENCOUNTER — Other Ambulatory Visit: Payer: Self-pay

## 2022-02-24 ENCOUNTER — Encounter: Payer: Self-pay | Admitting: Rehabilitation

## 2022-02-24 ENCOUNTER — Ambulatory Visit: Payer: Managed Care, Other (non HMO) | Attending: General Surgery | Admitting: Rehabilitation

## 2022-02-24 ENCOUNTER — Ambulatory Visit
Admission: RE | Admit: 2022-02-24 | Discharge: 2022-02-24 | Disposition: A | Discharge status: Home / Self Care | Payer: Commercial Managed Care - HMO | Source: Ambulatory Visit | Attending: Radiation Oncology | Admitting: Radiation Oncology

## 2022-02-24 DIAGNOSIS — M25612 Stiffness of left shoulder, not elsewhere classified: Secondary | ICD-10-CM | POA: Insufficient documentation

## 2022-02-24 DIAGNOSIS — C50412 Malignant neoplasm of upper-outer quadrant of left female breast: Secondary | ICD-10-CM | POA: Insufficient documentation

## 2022-02-24 DIAGNOSIS — M25611 Stiffness of right shoulder, not elsewhere classified: Secondary | ICD-10-CM | POA: Insufficient documentation

## 2022-02-24 DIAGNOSIS — R293 Abnormal posture: Secondary | ICD-10-CM | POA: Diagnosis present

## 2022-02-24 DIAGNOSIS — Z5112 Encounter for antineoplastic immunotherapy: Secondary | ICD-10-CM | POA: Diagnosis present

## 2022-02-24 DIAGNOSIS — Z483 Aftercare following surgery for neoplasm: Secondary | ICD-10-CM | POA: Diagnosis present

## 2022-02-24 DIAGNOSIS — Z79899 Other long term (current) drug therapy: Secondary | ICD-10-CM | POA: Diagnosis not present

## 2022-02-24 DIAGNOSIS — Z170 Estrogen receptor positive status [ER+]: Secondary | ICD-10-CM | POA: Insufficient documentation

## 2022-02-24 NOTE — Therapy (Signed)
[facility] [place] ?[place], [place], [address] ?Phone: [phone]   Fax:  [phone] ? ?Physical Therapy Treatment ? ?Patient Details  ?Name: Sheena Mcdaniel ?MRN: [id number] ?Date of Birth: [birth year] ?Referring Provider (PT): Dr. Marlou Starks ? ? ?Encounter Date: 02/24/2022 ? ? PT End of Session - 02/24/22 1520   ? ? Visit Number 8   ? Number of Visits 11   ? Date for PT Re-Evaluation 03/08/22   ? PT Start Time 1310   ? PT Stop Time 1358   ? PT Time Calculation (min) 48 min   ? Activity Tolerance Patient tolerated treatment well   ? Behavior During Therapy [facility] for tasks assessed/performed   ? ?  ?  ? ?  ? ? ?Past Medical History:  ?Diagnosis Date  ? Anxiety   ? Cancer ([place]) 03/2021  ? left breast IDC  ? ? ?Past Surgical History:  ?Procedure Laterality Date  ? MODIFIED MASTECTOMY Left 01/01/2022  ? Procedure: LEFT MODIFIED RADICAL MASTECTOMY;  Surgeon: Jovita Kussmaul, MD;  Location: [facility];  Service: General;  Laterality: Left;  ? PORTACATH PLACEMENT N/A 05/22/2021  ? Procedure: INSERTION PORT-A-CATH;  Surgeon: Jovita Kussmaul, MD;  Location: [facility];  Service: General;  Laterality: N/A;  ? TOTAL MASTECTOMY Right 01/01/2022  ? Procedure: RIGHT TOTAL MASTECTOMY;  Surgeon: Jovita Kussmaul, MD;  Location: [facility];  Service: General;  Laterality: Right;  ? WISDOM TOOTH EXTRACTION    ? ? ?There were no vitals filed for this visit. ? ? Subjective Assessment - 02/24/22 1308   ? ? Subjective I am doing okay.  I really don't feel the cording anymore   ? Pertinent History Dx of left breast cancer with multiple abnormal lymph nodes back in June. The cancer was a triple negative with a Ki-67 of 60%. She has undergone neoadjuvant chemotherapy and has had a good response. The cancer now measures 2.9 cm and the lymph nodes all looked normal. bil mastectomy 01/01/22 with 11 lymph nodes removed on left all negative. Drains out now.  Radiation coming up on 02/22/22.   ?  Currently in Pain? No/denies   ? ?  ?  ? ?  ? ? ? ? ? [facility] PT Assessment - 02/24/22 0001   ? ?  ? AROM  ? Left Shoulder Extension 70 Degrees   ? Left Shoulder Flexion 150 Degrees   ? Left Shoulder ABduction 160 Degrees   ? Left Shoulder External Rotation 96 Degrees   ? ?  ?  ? ?  ? ? ? ? ? ? ? ? ? ? ? ? ? ? ? ? [facility] Adult PT Treatment/Exercise - 02/24/22 0001   ? ?  ? Shoulder Exercises: Supine  ? Horizontal ABduction Both;10 reps   ? Theraband Level (Shoulder Horizontal ABduction) Level 1 (Yellow)   ? Diagonals Both;5 reps   ? Theraband Level (Shoulder Diagonals) Level 1 (Yellow)   ?  ? Shoulder Exercises: Standing  ? External Rotation Both;10 reps   ? Theraband Level (Shoulder External Rotation) Level 1 (Yellow)   ? Extension Both;10 reps   ? Theraband Level (Shoulder Extension) Level 1 (Yellow)   ? Extension Limitations with intial instruction   ? Row Both;10 reps;Theraband   ? Theraband Level (Shoulder Row) Level 1 (Yellow)   ? Row Limitations with initial instruction   ?  ? Shoulder Exercises: Pulleys  ? Flexion 2 minutes   ? ABduction  3 minutes   ?  ? Shoulder Exercises: Therapy Ball  ? Flexion Both;10 reps   ? Flexion Limitations forward lean into end of stretch   ?  ? Manual Therapy  ? Myofascial Release MFR to left UE cording with arm overhead but hard to palpate today   ? Passive ROM In Supine to Lt shoulder into flexion, abduction, ER   ? ?  ?  ? ?  ? ? ? ? ? ? ? ? ? ? ? ? ? ? ? PT Long Term Goals - 02/24/22 1523   ? ?  ? PT LONG TERM GOAL #1  ? Title Pt will return to baseline AROM   ? Status Achieved   ?  ? PT LONG TERM GOAL #2  ? Title Pt will be scheduled for SOZO surveillance   ? Status Achieved   ?  ? PT LONG TERM GOAL #3  ? Title Pt will be ind with starting strengthening to return to gym post breast cancer   ? Status Achieved   ? ?  ?  ? ?  ? ? ? ? ? ? ? ? Plan - 02/24/22 1524   ? ? Clinical Impression Statement Pt has returned to baseline AROM with minimal cording pull.  Pt is ind with stretches  and now Tband exercises to perform throughout the rest of radiation.  Pt is doing very well and would like to focus on radiation appts for now.  All LTGs have been met.  Pt was reminded about the importance of stretching throughout radiation and after and that we can check back in any time and how to return to gym activities low and slow.  Pt also fits well into the sleeve which she is now able to tolerance.   ? PT Next Visit Plan 15monthSOZO screens every 3 months until at least: 01/02/24   ? Consulted and Agree with Plan of Care Patient   ? ?  ?  ? ?  ? ? ?Patient will benefit from skilled therapeutic intervention in order to improve the following deficits and impairments:    ? ?Visit Diagnosis: ?Malignant neoplasm of upper-outer quadrant of left breast in female, estrogen receptor positive ([place] ? ?Stiffness of left shoulder, not elsewhere classified ? ?Abnormal posture ? ?Aftercare following surgery for neoplasm ? ?Stiffness of right shoulder, not elsewhere classified ? ? ? ? ?Problem List ?Patient Active Problem List  ? Diagnosis Date Noted  ? Port-A-Cath in place 06/18/2021  ? Malignant neoplasm of upper-outer quadrant of left breast in female, estrogen receptor positive ([place] 05/12/2021  ? Breast pain, left 04/24/2021  ? Elevated blood pressure reading 04/24/2021  ? Perimenopause 04/24/2021  ? ? ?TStark Bray PT ?02/24/2022, 3:28 PM ? ?[email or web address] [email or web address]?Phone: [phone]  Fax:  [phone]? ?Name: Sheena Mcdaniel?MRN: [id number]?Date of Birth: 310/03/1971? ? ?PHYSICAL THERAPY DISCHARGE SUMMARY ? ?Visits from Start of Care: 8 ? ?Current functional level related to goals / functional outcomes: ?See above ?  ?Remaining deficits: ?See above ?  ?Education / Equipment: ?HEP, compression sleeve and bra  ?Plan: ?Patient agrees to discharge.  Patient goals were not met. Patient is being discharged due to meeting the stated  rehab goals.    ? ?KShan Levans PT ? ? ?

## 2022-02-25 ENCOUNTER — Ambulatory Visit
Admission: RE | Admit: 2022-02-25 | Discharge: 2022-02-25 | Disposition: A | Discharge status: Home / Self Care | Payer: Commercial Managed Care - HMO | Source: Ambulatory Visit | Attending: Radiation Oncology | Admitting: Radiation Oncology

## 2022-02-25 DIAGNOSIS — Z5112 Encounter for antineoplastic immunotherapy: Secondary | ICD-10-CM | POA: Diagnosis not present

## 2022-02-26 ENCOUNTER — Ambulatory Visit
Admission: RE | Admit: 2022-02-26 | Discharge: 2022-02-26 | Disposition: A | Discharge status: Home / Self Care | Payer: Commercial Managed Care - HMO | Source: Ambulatory Visit | Attending: Radiation Oncology | Admitting: Radiation Oncology

## 2022-02-26 DIAGNOSIS — C50412 Malignant neoplasm of upper-outer quadrant of left female breast: Secondary | ICD-10-CM

## 2022-02-26 DIAGNOSIS — Z5112 Encounter for antineoplastic immunotherapy: Secondary | ICD-10-CM | POA: Diagnosis not present

## 2022-02-26 DIAGNOSIS — Z170 Estrogen receptor positive status [ER+]: Secondary | ICD-10-CM

## 2022-02-26 MED ORDER — RADIAPLEXRX EX GEL: Freq: Once | CUTANEOUS | Status: AC

## 2022-02-26 NOTE — Progress Notes (Signed)
Pt here for patient teaching.  Pt given Radiation and You booklet, skin care instructions, and Radiaplex.  Patient was advised to obtain over the counter aluminum free deodorant.  Reviewed areas of pertinence such as fatigue, hair loss, skin changes, breast tenderness, and breast swelling. Pt able to give teach back of to pat skin and use unscented/gentle soap,apply Radiaplex bid, avoid applying anything to skin within 4 hours of treatment, avoid wearing an under wire bra, and to use an electric razor if they must shave. Pt verbalizes understanding of information given and will contact nursing with any questions or concerns.   ? ?Sheena Mcdaniel. Leonie Green, BSN  ?

## 2022-03-01 ENCOUNTER — Other Ambulatory Visit: Payer: Self-pay

## 2022-03-01 ENCOUNTER — Encounter: Payer: Managed Care, Other (non HMO) | Admitting: Rehabilitation

## 2022-03-01 ENCOUNTER — Ambulatory Visit
Admission: RE | Admit: 2022-03-01 | Discharge: 2022-03-01 | Disposition: A | Discharge status: Home / Self Care | Payer: Commercial Managed Care - HMO | Source: Ambulatory Visit | Attending: Radiation Oncology | Admitting: Radiation Oncology

## 2022-03-01 DIAGNOSIS — Z5112 Encounter for antineoplastic immunotherapy: Secondary | ICD-10-CM | POA: Diagnosis not present

## 2022-03-02 ENCOUNTER — Ambulatory Visit
Admission: RE | Admit: 2022-03-02 | Discharge: 2022-03-02 | Disposition: A | Discharge status: Home / Self Care | Payer: Commercial Managed Care - HMO | Source: Ambulatory Visit | Attending: Radiation Oncology | Admitting: Radiation Oncology

## 2022-03-02 DIAGNOSIS — Z5112 Encounter for antineoplastic immunotherapy: Secondary | ICD-10-CM | POA: Diagnosis not present

## 2022-03-03 ENCOUNTER — Ambulatory Visit
Admission: RE | Admit: 2022-03-03 | Discharge: 2022-03-03 | Disposition: A | Discharge status: Home / Self Care | Payer: Commercial Managed Care - HMO | Source: Ambulatory Visit | Attending: Radiation Oncology | Admitting: Radiation Oncology

## 2022-03-03 ENCOUNTER — Encounter: Payer: Managed Care, Other (non HMO) | Admitting: Rehabilitation

## 2022-03-03 ENCOUNTER — Other Ambulatory Visit: Payer: Self-pay

## 2022-03-03 DIAGNOSIS — Z5112 Encounter for antineoplastic immunotherapy: Secondary | ICD-10-CM | POA: Diagnosis not present

## 2022-03-03 NOTE — Progress Notes (Signed)
? ?Patient Care Team: ?Mike Craze, DO as PCP - General (Internal Medicine) ?Mauro Kaufmann, RN as Oncology Nurse Navigator ?Rockwell Germany, RN as Oncology Nurse Navigator ? ?DIAGNOSIS:  ?  ICD-10-CM   ?1. Malignant neoplasm of upper-outer quadrant of left breast in female, estrogen receptor positive ([place])  C50.412   ? Z17.0   ?  ? ? ?SUMMARY OF ONCOLOGIC HISTORY: ?Oncology History  ?Malignant neoplasm of upper-outer quadrant of left breast in female, estrogen receptor positive ([place])  ?05/06/2021 Initial Diagnosis  ? Large circumscribed multicystic mass left breast 9 to 10 cm with multiple left axillary lymph nodes with cortical thickening: biopsy 3 o'clock position: Grade 2 IDC, ER 10%, PR 0%, Ki-67 60%, HER2 negative ?Right breast: 0.4 cm mass at 9:30 position: Benign on biopsy ?  ?05/12/2021 Cancer Staging  ? Staging form: Breast, AJCC 8th Edition ?- Clinical stage from 05/12/2021: Stage IIIB (cT3, cN1, cM0, G2, ER-, PR-, HER2-) - Signed by Nicholas Lose, MD on 05/12/2021 ?Stage prefix: Initial diagnosis ?Histologic grading system: 3 grade system ? ?  ?05/30/2021 - 11/27/2021 Chemotherapy  ? Patient is on Treatment Plan : BREAST Pembrolizumab + AC q21d x 4 cycles followed by Pembrolizumab + Carboplatin D1 + Paclitaxel D1,8,15 q21d X 4 cycles   ?   ? Surgery  ? Bilateral mastectomies: No residual cancer 0/11 LN Neg ?  ?01/21/2022 -  Chemotherapy  ? Patient is on Treatment Plan : BREAST Pembrolizumab (200) q21d x 27 weeks  ?   ? ? ?CHIEF COMPLIANT: Cycle 3 Keytruda ? ?INTERVAL HISTORY: Sheena Mcdaniel is a 51 y.o. with above-mentioned history of breast cancer currently on treatment with radiation and Keytruda. She presents to the clinic today for treatment.  She is tolerating radiation extremely well without any problems or concerns.  Tolerating immunotherapy with Beryle Flock also extremely well. ? ?ALLERGIES:  has No Known Allergies. ? ?MEDICATIONS:  ?Current Outpatient Medications  ?Medication Sig Dispense Refill   ? doxycycline (MONODOX) 100 MG capsule Take 100 mg by mouth 2 (two) times daily.    ? gabapentin (NEURONTIN) 100 MG capsule Take 1 capsule (100 mg total) by mouth 3 (three) times daily as needed. 60 capsule 3  ? HYDROcodone-acetaminophen (NORCO/VICODIN) 5-325 MG tablet Take 1 tablet by mouth every 6 (six) hours as needed for moderate pain or severe pain. 15 tablet 0  ? lidocaine-prilocaine (EMLA) cream Apply topically.    ? methocarbamol (ROBAXIN) 500 MG tablet Take 1 tablet (500 mg total) by mouth every 6 (six) hours as needed for muscle spasms. 30 tablet 1  ? ?No current facility-administered medications for this visit.  ? ? ?PHYSICAL EXAMINATION: ?ECOG PERFORMANCE STATUS: 1 - Symptomatic but completely ambulatory ? ?Vitals:  ? 03/04/22 1145  ?BP: 140/83  ?Pulse: (!) 59  ?Resp: 18  ?Temp: 98.9 ?F (37.2 ?C)  ?SpO2: 100%  ? ?Filed Weights  ? 03/04/22 1145  ?Weight: 148 lb 3.2 oz (67.2 kg)  ? ? ?LABORATORY DATA:  ?I have reviewed the data as listed ?CMP Latest Ref Rng & Units 02/11/2022 01/21/2022 12/30/2021  ?Glucose 70 - 99 mg/dL 103(H) 114(H) 101(H)  ?BUN 6 - 20 mg/dL 14 12 10   ?Creatinine 0.44 - 1.00 mg/dL 0.79 0.76 0.87  ?Sodium 135 - 145 mmol/L 140 139 138  ?Potassium 3.5 - 5.1 mmol/L 4.0 4.1 3.4(L)  ?Chloride 98 - 111 mmol/L 106 103 101  ?CO2 22 - 32 mmol/L 30 30 29   ?Calcium 8.9 - 10.3 mg/dL 9.4 9.4 9.6  ?  Total Protein 6.5 - 8.1 g/dL 6.3(L) 6.8 -  ?Total Bilirubin 0.3 - 1.2 mg/dL 0.4 0.3 -  ?Alkaline Phos 38 - 126 U/L 58 64 -  ?AST 15 - 41 U/L 19 17 -  ?ALT 0 - 44 U/L 16 13 -  ? ? ?Lab Results  ?Component Value Date  ? WBC 5.8 02/11/2022  ? HGB 9.1 (L) 02/11/2022  ? HCT 28.8 (L) 02/11/2022  ? MCV 84.7 02/11/2022  ? PLT 291 02/11/2022  ? NEUTROABS 2.5 02/11/2022  ? ? ?ASSESSMENT & PLAN:  ?Malignant neoplasm of upper-outer quadrant of left breast in female, estrogen receptor positive ([place]) ?05/06/2021: Large circumscribed multicystic mass left breast 9 to 10 cm with multiple left axillary lymph nodes with cortical  thickening: biopsy 3 o'clock position: Grade 2 IDC, ER 10%, PR 0%, Ki-67 60%, HER2 negative ?Right breast: 0.4 cm mass at 9:30 position: Benign on biopsy ?  ?Treatment plan: ?1.  Neoadjuvant chemotherapy with Adriamycin and Cytoxan along with pembrolizumab followed by Taxol carboplatin and pembrolizumab completed 11/27/2021 (pembrolizumab maintenance for 1 year) ?2. 01/01/2022 bilateral Mastectomies: No residual cancer, 0/11 LN neg ?3.  Adjuvant radiation 02/23/2022-04/07/2022 ?4.  Followed by adjuvant antiestrogen therapy (since she is 10% ER positive) ?------------------------------------------------------------------------------------------------------------------------------------------------------ ?Current Treatment; pembrolizumab maintenance therapy ?Undergoing adjuvant radiation ? After radiation is completed we will start antiestrogen therapy. ?Return to clinic every 3 weeks for pembrolizumab but every 6 weeks for follow-up with me. ?Tolerating it extremely well without any problems or concerns. ? ? ?No orders of the defined types were placed in this encounter. ? ?The patient has a good understanding of the overall plan. she agrees with it. she will call with any problems that may develop before the next visit here. ? ?Total time spent: 30 mins including face to face time and time spent for planning, charting and coordination of care ? ?Rulon Eisenmenger, MD, MPH ?03/04/2022 ? ?I, Thana Ates, am acting as scribe for Dr. Nicholas Lose. ? ?I have reviewed the above documentation for accuracy and completeness, and I agree with the above. ? ? ? ? ? ? ?

## 2022-03-04 ENCOUNTER — Inpatient Hospital Stay (HOSPITAL_BASED_OUTPATIENT_CLINIC_OR_DEPARTMENT_OTHER): Payer: Commercial Managed Care - HMO | Admitting: Hematology and Oncology

## 2022-03-04 ENCOUNTER — Ambulatory Visit
Admission: RE | Admit: 2022-03-04 | Discharge: 2022-03-04 | Disposition: A | Discharge status: Home / Self Care | Payer: Commercial Managed Care - HMO | Source: Ambulatory Visit | Attending: Radiation Oncology | Admitting: Radiation Oncology

## 2022-03-04 ENCOUNTER — Inpatient Hospital Stay: Payer: Commercial Managed Care - HMO

## 2022-03-04 ENCOUNTER — Inpatient Hospital Stay: Payer: Commercial Managed Care - HMO | Attending: Hematology and Oncology

## 2022-03-04 DIAGNOSIS — C50412 Malignant neoplasm of upper-outer quadrant of left female breast: Secondary | ICD-10-CM

## 2022-03-04 DIAGNOSIS — Z170 Estrogen receptor positive status [ER+]: Secondary | ICD-10-CM | POA: Diagnosis not present

## 2022-03-04 DIAGNOSIS — Z5112 Encounter for antineoplastic immunotherapy: Secondary | ICD-10-CM | POA: Diagnosis not present

## 2022-03-04 DIAGNOSIS — Z79899 Other long term (current) drug therapy: Secondary | ICD-10-CM | POA: Insufficient documentation

## 2022-03-04 DIAGNOSIS — Z95828 Presence of other vascular implants and grafts: Secondary | ICD-10-CM

## 2022-03-04 LAB — CBC WITH DIFFERENTIAL/PLATELET
Abs Immature Granulocytes: 0.01 10*3/uL (ref 0.00–0.07)
Basophils Absolute: 0 10*3/uL (ref 0.0–0.1)
Basophils Relative: 1 %
Eosinophils Absolute: 2.9 10*3/uL — ABNORMAL HIGH (ref 0.0–0.5)
Eosinophils Relative: 48 %
HCT: 29.7 % — ABNORMAL LOW (ref 36.0–46.0)
Hemoglobin: 9.5 g/dL — ABNORMAL LOW (ref 12.0–15.0)
Immature Granulocytes: 0 %
Lymphocytes Relative: 13 %
Lymphs Abs: 0.8 10*3/uL (ref 0.7–4.0)
MCH: 25.6 pg — ABNORMAL LOW (ref 26.0–34.0)
MCHC: 32 g/dL (ref 30.0–36.0)
MCV: 80.1 fL (ref 80.0–100.0)
Monocytes Absolute: 0.3 10*3/uL (ref 0.1–1.0)
Monocytes Relative: 5 %
Neutro Abs: 2 10*3/uL (ref 1.7–7.7)
Neutrophils Relative %: 33 %
Platelets: 276 10*3/uL (ref 150–400)
RBC: 3.71 MIL/uL — ABNORMAL LOW (ref 3.87–5.11)
RDW: 15 % (ref 11.5–15.5)
Smear Review: NORMAL
WBC: 6 10*3/uL (ref 4.0–10.5)
nRBC: 0 % (ref 0.0–0.2)

## 2022-03-04 LAB — COMPREHENSIVE METABOLIC PANEL
ALT: 18 U/L (ref 0–44)
AST: 21 U/L (ref 15–41)
Albumin: 3.9 g/dL (ref 3.5–5.0)
Alkaline Phosphatase: 65 U/L (ref 38–126)
Anion gap: 6 (ref 5–15)
BUN: 11 mg/dL (ref 6–20)
CO2: 29 mmol/L (ref 22–32)
Calcium: 9.3 mg/dL (ref 8.9–10.3)
Chloride: 105 mmol/L (ref 98–111)
Creatinine, Ser: 0.67 mg/dL (ref 0.44–1.00)
GFR, Estimated: >60 mL/min (ref >60)
Glucose, Bld: 109 mg/dL — ABNORMAL HIGH (ref 70–99)
Potassium: 3.9 mmol/L (ref 3.5–5.1)
Sodium: 140 mmol/L (ref 135–145)
Total Bilirubin: 0.3 mg/dL (ref 0.3–1.2)
Total Protein: 6.4 g/dL — ABNORMAL LOW (ref 6.5–8.1)

## 2022-03-04 LAB — TSH: TSH: 0.88 u[IU]/mL (ref 0.308–3.960)

## 2022-03-04 MED ORDER — SODIUM CHLORIDE 0.9 % IV SOLN
200.0000 mg | Freq: Once | INTRAVENOUS | Status: AC
  Administered 2022-03-04: 13:00:00 200 mg via INTRAVENOUS
  Filled 2022-03-04: qty 200

## 2022-03-04 MED ORDER — HEPARIN SOD (PORK) LOCK FLUSH 100 UNIT/ML IV SOLN
500.0000 [IU] | Freq: Once | INTRAVENOUS | Status: AC | PRN
  Administered 2022-03-04: 14:00:00 500 [IU]

## 2022-03-04 MED ORDER — SODIUM CHLORIDE 0.9% FLUSH
10.0000 mL | Freq: Once | INTRAVENOUS | Status: AC
  Administered 2022-03-04: 11:00:00 10 mL

## 2022-03-04 MED ORDER — SODIUM CHLORIDE 0.9% FLUSH
10.0000 mL | INTRAVENOUS | Status: DC | PRN
  Administered 2022-03-04: 14:00:00 10 mL

## 2022-03-04 MED ORDER — SODIUM CHLORIDE 0.9 % IV SOLN: Freq: Once | INTRAVENOUS | Status: AC

## 2022-03-04 NOTE — Patient Instructions (Signed)
[facility] MEDICAL ONCOLOGY  Discharge Instructions: ?Thank you for choosing [facility] to provide your oncology and hematology care.  ? ?If you have a lab appointment with the Cancer Center, please go directly to the Cancer Center and check in at the registration area. ?  ?Wear comfortable clothing and clothing appropriate for easy access to any Portacath or PICC line.  ? ?We strive to give you quality time with your provider. You may need to reschedule your appointment if you arrive late (15 or more minutes).  Arriving late affects you and other patients whose appointments are after yours.  Also, if you miss three or more appointments without notifying the office, you may be dismissed from the clinic at the provider?s discretion.    ?  ?For prescription refill requests, have your pharmacy contact our office and allow 72 hours for refills to be completed.   ? ?Today you received the following chemotherapy and/or immunotherapy agents: Keytruda ?  ?To help prevent nausea and vomiting after your treatment, we encourage you to take your nausea medication as directed. ? ?BELOW ARE SYMPTOMS THAT SHOULD BE REPORTED IMMEDIATELY: ?*FEVER GREATER THAN 100.4 F (38 ?C) OR HIGHER ?*CHILLS OR SWEATING ?*NAUSEA AND VOMITING THAT IS NOT CONTROLLED WITH YOUR NAUSEA MEDICATION ?*UNUSUAL SHORTNESS OF BREATH ?*UNUSUAL BRUISING OR BLEEDING ?*URINARY PROBLEMS (pain or burning when urinating, or frequent urination) ?*BOWEL PROBLEMS (unusual diarrhea, constipation, pain near the anus) ?TENDERNESS IN MOUTH AND THROAT WITH OR WITHOUT PRESENCE OF ULCERS (sore throat, sores in mouth, or a toothache) ?UNUSUAL RASH, SWELLING OR PAIN  ?UNUSUAL VAGINAL DISCHARGE OR ITCHING  ? ?Items with * indicate a potential emergency and should be followed up as soon as possible or go to the Emergency Department if any problems should occur. ? ?Please show the CHEMOTHERAPY ALERT CARD or IMMUNOTHERAPY ALERT CARD at check-in to the  Emergency Department and triage nurse. ? ?Should you have questions after your visit or need to cancel or reschedule your appointment, please contact [facility]  Dept: [phone]  and follow the prompts.  Office hours are 8:00 a.m. to 4:30 p.m. Monday - Friday. Please note that voicemails left after 4:00 p.m. may not be returned until the following business day.  We are closed weekends and major holidays. You have access to a nurse at all times for urgent questions. Please call the main number to the clinic Dept: [phone] and follow the prompts. ? ? ?For any non-urgent questions, you may also contact your provider using MyChart. We now offer e-Visits for anyone 18 and older to request care online for non-urgent symptoms. For details visit [email or web address]. ?  ?Also download the MyChart app! Go to the app store, search "MyChart", open the app, select , and log in with your MyChart username and password. ? ?Due to Covid, a mask is required upon entering the hospital/clinic. If you do not have a mask, one will be given to you upon arrival. For doctor visits, patients may have 1 support person aged 18 or older with them. For treatment visits, patients cannot have anyone with them due to current Covid guidelines and our immunocompromised population.  ? ?

## 2022-03-04 NOTE — Assessment & Plan Note (Signed)
05/06/2021:?Large circumscribed multicystic mass left breast 9 to 10 cm with multiple left axillary lymph nodes with cortical thickening: biopsy 3 o'clock position: Grade 2 IDC, ER 10%, PR 0%, Ki-67 60%, HER2 negative ?Right breast: 0.4 cm mass at 9:30 position: Benign on biopsy ?? ?Treatment plan: ?1.??Neoadjuvant chemotherapy with Adriamycin and Cytoxan along with pembrolizumab followed by Taxol carboplatin and pembrolizumab completed 11/27/2021 (pembrolizumab maintenance for 1 year) ?2.?01/01/2022 bilateral Mastectomies: No residual cancer, 0/11 LN neg ?3.??Adjuvant radiation 02/23/2022-04/07/2022 ?4.??Followed by adjuvant antiestrogen therapy (since she is 10% ER positive) ?------------------------------------------------------------------------------------------------------------------------------------------------------ ?Current Treatment;?pembrolizumab maintenance therapy ?Undergoing adjuvant radiation ??After radiation is completed we will start antiestrogen therapy. ?Return to clinic every 3 weeks for pembrolizumab but every 6 weeks for follow-up with me. ?

## 2022-03-05 ENCOUNTER — Other Ambulatory Visit: Payer: Self-pay

## 2022-03-05 ENCOUNTER — Ambulatory Visit
Admission: RE | Admit: 2022-03-05 | Discharge: 2022-03-05 | Disposition: A | Discharge status: Home / Self Care | Payer: Commercial Managed Care - HMO | Source: Ambulatory Visit | Attending: Radiation Oncology | Admitting: Radiation Oncology

## 2022-03-05 ENCOUNTER — Telehealth: Payer: Self-pay | Admitting: Hematology and Oncology

## 2022-03-05 ENCOUNTER — Ambulatory Visit: Payer: Commercial Managed Care - HMO

## 2022-03-05 DIAGNOSIS — Z5112 Encounter for antineoplastic immunotherapy: Secondary | ICD-10-CM | POA: Diagnosis not present

## 2022-03-05 LAB — T4: T4, Total: 10.8 ug/dL (ref 4.5–12.0)

## 2022-03-05 NOTE — Telephone Encounter (Signed)
Scheduled appointment per 03/08 los. Patient aware.  ? ?

## 2022-03-08 ENCOUNTER — Other Ambulatory Visit: Payer: Self-pay

## 2022-03-08 ENCOUNTER — Ambulatory Visit
Admission: RE | Admit: 2022-03-08 | Discharge: 2022-03-08 | Disposition: A | Discharge status: Home / Self Care | Payer: Commercial Managed Care - HMO | Source: Ambulatory Visit | Attending: Radiation Oncology | Admitting: Radiation Oncology

## 2022-03-08 DIAGNOSIS — Z5112 Encounter for antineoplastic immunotherapy: Secondary | ICD-10-CM | POA: Diagnosis not present

## 2022-03-09 ENCOUNTER — Ambulatory Visit
Admission: RE | Admit: 2022-03-09 | Discharge: 2022-03-09 | Disposition: A | Discharge status: Home / Self Care | Payer: Commercial Managed Care - HMO | Source: Ambulatory Visit | Attending: Radiation Oncology | Admitting: Radiation Oncology

## 2022-03-09 DIAGNOSIS — Z5112 Encounter for antineoplastic immunotherapy: Secondary | ICD-10-CM | POA: Diagnosis not present

## 2022-03-10 ENCOUNTER — Ambulatory Visit
Admission: RE | Admit: 2022-03-10 | Discharge: 2022-03-10 | Disposition: A | Discharge status: Home / Self Care | Payer: Commercial Managed Care - HMO | Source: Ambulatory Visit | Attending: Radiation Oncology | Admitting: Radiation Oncology

## 2022-03-10 ENCOUNTER — Other Ambulatory Visit: Payer: Self-pay

## 2022-03-10 DIAGNOSIS — Z5112 Encounter for antineoplastic immunotherapy: Secondary | ICD-10-CM | POA: Diagnosis not present

## 2022-03-11 ENCOUNTER — Ambulatory Visit
Admission: RE | Admit: 2022-03-11 | Discharge: 2022-03-11 | Disposition: A | Discharge status: Home / Self Care | Payer: Commercial Managed Care - HMO | Source: Ambulatory Visit | Attending: Radiation Oncology | Admitting: Radiation Oncology

## 2022-03-11 ENCOUNTER — Encounter: Payer: Self-pay | Admitting: *Deleted

## 2022-03-11 DIAGNOSIS — Z5112 Encounter for antineoplastic immunotherapy: Secondary | ICD-10-CM | POA: Diagnosis not present

## 2022-03-12 ENCOUNTER — Ambulatory Visit: Payer: Commercial Managed Care - HMO | Admitting: Radiation Oncology

## 2022-03-12 ENCOUNTER — Other Ambulatory Visit: Payer: Self-pay

## 2022-03-12 ENCOUNTER — Ambulatory Visit
Admission: RE | Admit: 2022-03-12 | Discharge: 2022-03-12 | Disposition: A | Discharge status: Home / Self Care | Payer: Commercial Managed Care - HMO | Source: Ambulatory Visit | Attending: Radiation Oncology | Admitting: Radiation Oncology

## 2022-03-12 DIAGNOSIS — Z5112 Encounter for antineoplastic immunotherapy: Secondary | ICD-10-CM | POA: Diagnosis not present

## 2022-03-15 ENCOUNTER — Other Ambulatory Visit: Payer: Self-pay

## 2022-03-15 ENCOUNTER — Ambulatory Visit
Admission: RE | Admit: 2022-03-15 | Discharge: 2022-03-15 | Disposition: A | Discharge status: Home / Self Care | Payer: Commercial Managed Care - HMO | Source: Ambulatory Visit | Attending: Radiation Oncology | Admitting: Radiation Oncology

## 2022-03-15 DIAGNOSIS — Z5112 Encounter for antineoplastic immunotherapy: Secondary | ICD-10-CM | POA: Diagnosis not present

## 2022-03-16 ENCOUNTER — Ambulatory Visit
Admission: RE | Admit: 2022-03-16 | Discharge: 2022-03-16 | Disposition: A | Discharge status: Home / Self Care | Payer: Commercial Managed Care - HMO | Source: Ambulatory Visit | Attending: Radiation Oncology | Admitting: Radiation Oncology

## 2022-03-16 DIAGNOSIS — Z5112 Encounter for antineoplastic immunotherapy: Secondary | ICD-10-CM | POA: Diagnosis not present

## 2022-03-17 ENCOUNTER — Other Ambulatory Visit: Payer: Self-pay

## 2022-03-17 ENCOUNTER — Ambulatory Visit
Admission: RE | Admit: 2022-03-17 | Discharge: 2022-03-17 | Disposition: A | Discharge status: Home / Self Care | Payer: Commercial Managed Care - HMO | Source: Ambulatory Visit | Attending: Radiation Oncology | Admitting: Radiation Oncology

## 2022-03-17 DIAGNOSIS — Z5112 Encounter for antineoplastic immunotherapy: Secondary | ICD-10-CM | POA: Diagnosis not present

## 2022-03-18 ENCOUNTER — Ambulatory Visit
Admission: RE | Admit: 2022-03-18 | Discharge: 2022-03-18 | Disposition: A | Discharge status: Home / Self Care | Payer: Commercial Managed Care - HMO | Source: Ambulatory Visit | Attending: Radiation Oncology | Admitting: Radiation Oncology

## 2022-03-18 DIAGNOSIS — Z5112 Encounter for antineoplastic immunotherapy: Secondary | ICD-10-CM | POA: Diagnosis not present

## 2022-03-19 ENCOUNTER — Other Ambulatory Visit: Payer: Self-pay

## 2022-03-19 ENCOUNTER — Ambulatory Visit: Payer: Commercial Managed Care - HMO | Admitting: Radiation Oncology

## 2022-03-19 ENCOUNTER — Ambulatory Visit
Admission: RE | Admit: 2022-03-19 | Discharge: 2022-03-19 | Disposition: A | Discharge status: Home / Self Care | Payer: Commercial Managed Care - HMO | Source: Ambulatory Visit | Attending: Radiation Oncology | Admitting: Radiation Oncology

## 2022-03-19 DIAGNOSIS — Z5112 Encounter for antineoplastic immunotherapy: Secondary | ICD-10-CM | POA: Diagnosis not present

## 2022-03-22 ENCOUNTER — Other Ambulatory Visit: Payer: Self-pay

## 2022-03-22 ENCOUNTER — Ambulatory Visit
Admission: RE | Admit: 2022-03-22 | Discharge: 2022-03-22 | Disposition: A | Discharge status: Home / Self Care | Payer: Commercial Managed Care - HMO | Source: Ambulatory Visit | Attending: Radiation Oncology | Admitting: Radiation Oncology

## 2022-03-22 DIAGNOSIS — Z5112 Encounter for antineoplastic immunotherapy: Secondary | ICD-10-CM | POA: Diagnosis not present

## 2022-03-23 ENCOUNTER — Ambulatory Visit
Admission: RE | Admit: 2022-03-23 | Discharge: 2022-03-23 | Disposition: A | Discharge status: Home / Self Care | Payer: Commercial Managed Care - HMO | Source: Ambulatory Visit | Attending: Radiation Oncology | Admitting: Radiation Oncology

## 2022-03-23 DIAGNOSIS — Z5112 Encounter for antineoplastic immunotherapy: Secondary | ICD-10-CM | POA: Diagnosis not present

## 2022-03-24 ENCOUNTER — Other Ambulatory Visit: Payer: Self-pay

## 2022-03-24 ENCOUNTER — Ambulatory Visit
Admission: RE | Admit: 2022-03-24 | Discharge: 2022-03-24 | Disposition: A | Discharge status: Home / Self Care | Payer: Commercial Managed Care - HMO | Source: Ambulatory Visit | Attending: Radiation Oncology | Admitting: Radiation Oncology

## 2022-03-24 DIAGNOSIS — Z5112 Encounter for antineoplastic immunotherapy: Secondary | ICD-10-CM | POA: Diagnosis not present

## 2022-03-25 ENCOUNTER — Ambulatory Visit: Payer: Commercial Managed Care - HMO

## 2022-03-25 ENCOUNTER — Ambulatory Visit
Admission: RE | Admit: 2022-03-25 | Discharge: 2022-03-25 | Disposition: A | Discharge status: Home / Self Care | Payer: Commercial Managed Care - HMO | Source: Ambulatory Visit | Attending: Radiation Oncology | Admitting: Radiation Oncology

## 2022-03-25 DIAGNOSIS — Z5112 Encounter for antineoplastic immunotherapy: Secondary | ICD-10-CM | POA: Diagnosis not present

## 2022-03-26 ENCOUNTER — Ambulatory Visit: Payer: Commercial Managed Care - HMO

## 2022-03-26 ENCOUNTER — Ambulatory Visit
Admission: RE | Admit: 2022-03-26 | Discharge: 2022-03-26 | Disposition: A | Discharge status: Home / Self Care | Payer: Commercial Managed Care - HMO | Source: Ambulatory Visit | Attending: Radiation Oncology | Admitting: Radiation Oncology

## 2022-03-26 ENCOUNTER — Inpatient Hospital Stay: Payer: Commercial Managed Care - HMO

## 2022-03-26 ENCOUNTER — Other Ambulatory Visit: Payer: Self-pay

## 2022-03-26 VITALS — BP 128/88 | HR 95 | Temp 97.9°F | Resp 18 | Wt 145.1 lb

## 2022-03-26 DIAGNOSIS — Z170 Estrogen receptor positive status [ER+]: Secondary | ICD-10-CM

## 2022-03-26 DIAGNOSIS — Z95828 Presence of other vascular implants and grafts: Secondary | ICD-10-CM

## 2022-03-26 DIAGNOSIS — Z5112 Encounter for antineoplastic immunotherapy: Secondary | ICD-10-CM | POA: Diagnosis not present

## 2022-03-26 LAB — CMP (CANCER CENTER ONLY)
ALT: 12 U/L (ref 0–44)
AST: 17 U/L (ref 15–41)
Albumin: 3.8 g/dL (ref 3.5–5.0)
Alkaline Phosphatase: 50 U/L (ref 38–126)
Anion gap: 5 (ref 5–15)
BUN: 10 mg/dL (ref 6–20)
CO2: 28 mmol/L (ref 22–32)
Calcium: 9.2 mg/dL (ref 8.9–10.3)
Chloride: 108 mmol/L (ref 98–111)
Creatinine: 0.67 mg/dL (ref 0.44–1.00)
GFR, Estimated: >60 mL/min (ref >60)
Glucose, Bld: 127 mg/dL — ABNORMAL HIGH (ref 70–99)
Potassium: 4.1 mmol/L (ref 3.5–5.1)
Sodium: 141 mmol/L (ref 135–145)
Total Bilirubin: 0.4 mg/dL (ref 0.3–1.2)
Total Protein: 6.4 g/dL — ABNORMAL LOW (ref 6.5–8.1)

## 2022-03-26 LAB — CBC WITH DIFFERENTIAL (CANCER CENTER ONLY)
Abs Immature Granulocytes: 0 10*3/uL (ref 0.00–0.07)
Basophils Absolute: 0 10*3/uL (ref 0.0–0.1)
Basophils Relative: 1 %
Eosinophils Absolute: 3.8 10*3/uL — ABNORMAL HIGH (ref 0.0–0.5)
Eosinophils Relative: 53 %
HCT: 30.9 % — ABNORMAL LOW (ref 36.0–46.0)
Hemoglobin: 9.6 g/dL — ABNORMAL LOW (ref 12.0–15.0)
Immature Granulocytes: 0 %
Lymphocytes Relative: 8 %
Lymphs Abs: 0.5 10*3/uL — ABNORMAL LOW (ref 0.7–4.0)
MCH: 24.5 pg — ABNORMAL LOW (ref 26.0–34.0)
MCHC: 31.1 g/dL (ref 30.0–36.0)
MCV: 78.8 fL — ABNORMAL LOW (ref 80.0–100.0)
Monocytes Absolute: 0.5 10*3/uL (ref 0.1–1.0)
Monocytes Relative: 7 %
Neutro Abs: 2.1 10*3/uL (ref 1.7–7.7)
Neutrophils Relative %: 31 %
Platelet Count: 250 10*3/uL (ref 150–400)
RBC: 3.92 MIL/uL (ref 3.87–5.11)
RDW: 15.9 % — ABNORMAL HIGH (ref 11.5–15.5)
WBC Count: 7 10*3/uL (ref 4.0–10.5)
nRBC: 0 % (ref 0.0–0.2)

## 2022-03-26 LAB — TSH: TSH: 1.533 u[IU]/mL (ref 0.308–3.960)

## 2022-03-26 MED ORDER — SODIUM CHLORIDE 0.9% FLUSH
10.0000 mL | INTRAVENOUS | Status: DC | PRN
  Administered 2022-03-26: 10 mL

## 2022-03-26 MED ORDER — SODIUM CHLORIDE 0.9 % IV SOLN: Freq: Once | INTRAVENOUS | Status: AC

## 2022-03-26 MED ORDER — SODIUM CHLORIDE 0.9% FLUSH
10.0000 mL | Freq: Once | INTRAVENOUS | Status: AC
  Administered 2022-03-26: 10 mL

## 2022-03-26 MED ORDER — SODIUM CHLORIDE 0.9 % IV SOLN
200.0000 mg | Freq: Once | INTRAVENOUS | Status: AC
  Administered 2022-03-26: 200 mg via INTRAVENOUS
  Filled 2022-03-26: qty 200

## 2022-03-26 MED ORDER — HEPARIN SOD (PORK) LOCK FLUSH 100 UNIT/ML IV SOLN
500.0000 [IU] | Freq: Once | INTRAVENOUS | Status: AC | PRN
  Administered 2022-03-26: 500 [IU]

## 2022-03-26 NOTE — Patient Instructions (Signed)
[facility]COL[facility]  Discharge Instructions: ?Thank you for choosing [facility] to provide your oncology and hematology care.  ? ?If you have a lab appointment with the Cancer Center, please go directly to the Cancer Center and check in at the registration area. ?  ?Wear comfortable clothing and clothing appropriate for easy access to any Portacath or PICC line.  ? ?We strive to give you quality time with your provider. You may need to reschedule your appointment if you arrive late (15 or more minutes).  Arriving late affects you and other patients whose appointments are after yours.  Also, if you miss three or more appointments without notifying the office, you may be dismissed from the clinic at the provider?s discretion.    ?  ?For prescription refill requests, have your pharmacy contact our office and allow 72 hours for refills to be completed.   ? ?Today you received the following chemotherapy and/or immunotherapy agents: Keytruda ?  ?To help prevent nausea and vomiting after your treatment, we encourage you to take your nausea medication as directed. ? ?BELOW ARE SYMPTOMS THAT SHOULD BE REPORTED IMMEDIATELY: ?*FEVER GREATER THAN 100.4 F (38 ?C) OR HIGHER ?*CHILLS OR SWEATING ?*NAUSEA AND VOMITING THAT IS NOT CONTROLLED WITH YOUR NAUSEA MEDICATION ?*UNUSUAL SHORTNESS OF BREATH ?*UNUSUAL BRUISING OR BLEEDING ?*URINARY PROBLEMS (pain or burning when urinating, or frequent urination) ?*BOWEL PROBLEMS (unusual diarrhea, constipation, pain near the anus) ?TENDERNESS IN MOUTH AND THROAT WITH OR WITHOUT PRESENCE OF ULCERS (sore throat, sores in mouth, or a toothache) ?UNUSUAL RASH, SWELLING OR PAIN  ?UNUSUAL VAGINAL DISCHARGE OR ITCHING  ? ?Items with * indicate a potential emergency and should be followed up as soon as possible or go to the Emergency Department if any problems should occur. ? ?Please show the CHEMOTHERAPY ALERT CARD or IMMUNOTHERAPY ALERT CARD at check-in to the  Emergency Department and triage nurse. ? ?Should you have questions after your visit or need to cancel or reschedule your appointment, please contact [facility]ICAL ONCOL[facility]  Dept: [phone]  and follow the prompts.  Office hours are 8:00 a.m. to 4:30 p.m. Monday - Friday. Please note that voicemails left after 4:00 p.m. may not be returned until the following business day.  We are closed weekends and major holidays. You have access to a nurse at all times for urgent questions. Please call the main number to the clinic Dept: [phone] and follow the prompts. ? ? ?For any non-urgent questions, you may also contact your provider using MyChart. We now offer e-Visits for anyone 18 and older to request care online for non-urgent symptoms. For details visit [email or web address]. ?  ?Also download the MyChart app! Go to the app store, search "MyChart", open the app, select , and log in with your MyChart username and password. ? ?Due to Covid, a mask is required upon entering the hospital/clinic. If you do not have a mask, one will be given to you upon arrival. For doctor visits, patients may have 1 support person aged 18 or older with them. For treatment visits, patients cannot have anyone with them due to current Covid guidelines and our immunocompromised population.  ? ?

## 2022-03-27 LAB — T4: T4, Total: 10.1 ug/dL (ref 4.5–12.0)

## 2022-03-28 DIAGNOSIS — Z79899 Other long term (current) drug therapy: Secondary | ICD-10-CM | POA: Insufficient documentation

## 2022-03-28 DIAGNOSIS — Z510 Encounter for antineoplastic radiation therapy: Secondary | ICD-10-CM | POA: Insufficient documentation

## 2022-03-28 DIAGNOSIS — Z170 Estrogen receptor positive status [ER+]: Secondary | ICD-10-CM | POA: Diagnosis not present

## 2022-03-28 DIAGNOSIS — Z5112 Encounter for antineoplastic immunotherapy: Secondary | ICD-10-CM | POA: Insufficient documentation

## 2022-03-28 DIAGNOSIS — C50412 Malignant neoplasm of upper-outer quadrant of left female breast: Secondary | ICD-10-CM | POA: Diagnosis not present

## 2022-03-29 ENCOUNTER — Ambulatory Visit
Admission: RE | Admit: 2022-03-29 | Discharge: 2022-03-29 | Disposition: A | Discharge status: Home / Self Care | Payer: Commercial Managed Care - HMO | Source: Ambulatory Visit | Attending: Radiation Oncology | Admitting: Radiation Oncology

## 2022-03-29 ENCOUNTER — Other Ambulatory Visit: Payer: Self-pay

## 2022-03-29 ENCOUNTER — Ambulatory Visit: Payer: Commercial Managed Care - HMO

## 2022-03-29 DIAGNOSIS — Z510 Encounter for antineoplastic radiation therapy: Secondary | ICD-10-CM | POA: Diagnosis not present

## 2022-03-30 ENCOUNTER — Ambulatory Visit
Admission: RE | Admit: 2022-03-30 | Discharge: 2022-03-30 | Disposition: A | Discharge status: Home / Self Care | Payer: Commercial Managed Care - HMO | Source: Ambulatory Visit | Attending: Radiation Oncology | Admitting: Radiation Oncology

## 2022-03-30 ENCOUNTER — Ambulatory Visit: Payer: Commercial Managed Care - HMO

## 2022-03-30 DIAGNOSIS — Z510 Encounter for antineoplastic radiation therapy: Secondary | ICD-10-CM | POA: Diagnosis not present

## 2022-03-31 ENCOUNTER — Ambulatory Visit
Admission: RE | Admit: 2022-03-31 | Discharge: 2022-03-31 | Disposition: A | Discharge status: Home / Self Care | Payer: Commercial Managed Care - HMO | Source: Ambulatory Visit | Attending: Radiation Oncology | Admitting: Radiation Oncology

## 2022-03-31 ENCOUNTER — Other Ambulatory Visit: Payer: Self-pay

## 2022-03-31 ENCOUNTER — Ambulatory Visit: Payer: Commercial Managed Care - HMO

## 2022-03-31 DIAGNOSIS — Z510 Encounter for antineoplastic radiation therapy: Secondary | ICD-10-CM | POA: Diagnosis not present

## 2022-04-01 ENCOUNTER — Ambulatory Visit
Admission: RE | Admit: 2022-04-01 | Discharge: 2022-04-01 | Disposition: A | Discharge status: Home / Self Care | Payer: Commercial Managed Care - HMO | Source: Ambulatory Visit | Attending: Radiation Oncology | Admitting: Radiation Oncology

## 2022-04-01 ENCOUNTER — Ambulatory Visit: Payer: Managed Care, Other (non HMO) | Attending: General Surgery | Admitting: Physical Therapy

## 2022-04-01 ENCOUNTER — Ambulatory Visit: Payer: Commercial Managed Care - HMO

## 2022-04-01 ENCOUNTER — Encounter: Payer: Self-pay | Admitting: Physical Therapy

## 2022-04-01 DIAGNOSIS — Z483 Aftercare following surgery for neoplasm: Secondary | ICD-10-CM | POA: Insufficient documentation

## 2022-04-01 DIAGNOSIS — Z510 Encounter for antineoplastic radiation therapy: Secondary | ICD-10-CM | POA: Diagnosis not present

## 2022-04-01 NOTE — Therapy (Signed)
?  OUTPATIENT PHYSICAL THERAPY SOZO SCREENING NOTE ? ? ?Patient Name: Sheena Mcdaniel ?MRN: [id number] ?DOB:27-Sep-1971, 50 y.o., female ?Today's Date: 04/01/2022 ? ?PCP: Mike Craze, DO ?REFERRING PROVIDER: Jovita Kussmaul, MD ? ? PT End of Session - 04/01/22 1207   ? ? Visit Number 8   ? Number of Visits 11   ? PT Start Time 1150   ? PT Stop Time 1200   ? PT Time Calculation (min) 10 min   ? Activity Tolerance Patient tolerated treatment well   ? Behavior During Therapy [facility] for tasks assessed/performed   ? ?  ?  ? ?  ? ? ?Past Medical History:  ?Diagnosis Date  ? Anxiety   ? Cancer ([place]) 03/2021  ? left breast IDC  ? ?Past Surgical History:  ?Procedure Laterality Date  ? MODIFIED MASTECTOMY Left 01/01/2022  ? Procedure: LEFT MODIFIED RADICAL MASTECTOMY;  Surgeon: Jovita Kussmaul, MD;  Location: [facility];  Service: General;  Laterality: Left;  ? PORTACATH PLACEMENT N/A 05/22/2021  ? Procedure: INSERTION PORT-A-CATH;  Surgeon: Jovita Kussmaul, MD;  Location: [facility];  Service: General;  Laterality: N/A;  ? TOTAL MASTECTOMY Right 01/01/2022  ? Procedure: RIGHT TOTAL MASTECTOMY;  Surgeon: Jovita Kussmaul, MD;  Location: [facility];  Service: General;  Laterality: Right;  ? WISDOM TOOTH EXTRACTION    ? ?Patient Active Problem List  ? Diagnosis Date Noted  ? Port-A-Cath in place 06/18/2021  ? Malignant neoplasm of upper-outer quadrant of left breast in female, estrogen receptor positive ([place]) 05/12/2021  ? Breast pain, left 04/24/2021  ? Elevated blood pressure reading 04/24/2021  ? Perimenopause 04/24/2021  ? ? ?REFERRING DIAG: left breast cancer at risk for lymphedema ? ?THERAPY DIAG:  ?Aftercare following surgery for neoplasm ? ?PERTINENT HISTORY: Dx of left breast cancer with multiple abnormal lymph nodes back in June. The cancer was a triple negative with a Ki-67 of 60%. She has undergone neoadjuvant chemotherapy and has had a good response. The cancer now measures 2.9 cm and the lymph nodes all looked normal. bil  mastectomy 01/01/22 with 11 lymph nodes removed on left all negative. Drains out now. Radiation coming up on 02/22/22. ? ?PRECAUTIONS: left UE Lymphedema risk, None ? ?SUBJECTIVE: Here for SOZO screen. No problems reported. ? ?PAIN:  ?Are you having pain? No ? ?SOZO SCREENING: ?Patient was assessed today using the SOZO machine to determine the lymphedema index score. This was compared to her baseline score. It was determined that she is within the recommended range when compared to her baseline and no further action is needed at this time. She will continue SOZO screenings. These are done every 3 months for 2 years post operatively followed by every 6 months for 2 years, and then annually. ? ?Annia Friendly, PT ?04/01/22 12:11 PM ? ? ?

## 2022-04-02 ENCOUNTER — Ambulatory Visit: Payer: Commercial Managed Care - HMO

## 2022-04-02 ENCOUNTER — Ambulatory Visit
Admission: RE | Admit: 2022-04-02 | Discharge: 2022-04-02 | Disposition: A | Discharge status: Home / Self Care | Payer: Commercial Managed Care - HMO | Source: Ambulatory Visit | Attending: Radiation Oncology | Admitting: Radiation Oncology

## 2022-04-02 ENCOUNTER — Other Ambulatory Visit: Payer: Self-pay

## 2022-04-02 DIAGNOSIS — Z510 Encounter for antineoplastic radiation therapy: Secondary | ICD-10-CM | POA: Diagnosis not present

## 2022-04-02 NOTE — Progress Notes (Signed)
? ?Patient Care Team: ?Mike Craze, DO as PCP - General (Internal Medicine) ?Mauro Kaufmann, RN as Oncology Nurse Navigator ?Rockwell Germany, RN as Oncology Nurse Navigator ? ?DIAGNOSIS:  ?Encounter Diagnoses  ?Name Primary?  ? Malignant neoplasm of upper-outer quadrant of left breast in female, estrogen receptor positive ([place])   ? Microcytic anemia Yes  ? ? ?SUMMARY OF ONCOLOGIC HISTORY: ?Oncology History  ?Malignant neoplasm of upper-outer quadrant of left breast in female, estrogen receptor positive ([place])  ?05/06/2021 Initial Diagnosis  ? Large circumscribed multicystic mass left breast 9 to 10 cm with multiple left axillary lymph nodes with cortical thickening: biopsy 3 o'clock position: Grade 2 IDC, ER 10%, PR 0%, Ki-67 60%, HER2 negative ?Right breast: 0.4 cm mass at 9:30 position: Benign on biopsy ?  ?05/12/2021 Cancer Staging  ? Staging form: Breast, AJCC 8th Edition ?- Clinical stage from 05/12/2021: Stage IIIB (cT3, cN1, cM0, G2, ER-, PR-, HER2-) - Signed by Nicholas Lose, MD on 05/12/2021 ?Stage prefix: Initial diagnosis ?Histologic grading system: 3 grade system ? ?  ?05/30/2021 - 11/27/2021 Chemotherapy  ? Patient is on Treatment Plan : BREAST Pembrolizumab + AC q21d x 4 cycles followed by Pembrolizumab + Carboplatin D1 + Paclitaxel D1,8,15 q21d X 4 cycles   ? ?  ?  ? Surgery  ? Bilateral mastectomies: No residual cancer 0/11 LN Neg ?  ?01/21/2022 -  Chemotherapy  ? Patient is on Treatment Plan : BREAST Pembrolizumab (200) q21d x 27 weeks  ? ?  ?  ? ? ?CHIEF COMPLIANT: Keytruda  ? ?INTERVAL HISTORY: Sheena Mcdaniel is a 51 y.o. with above-mentioned history of breast cancer currently on treatment with radiation and currently on Keytruda. She states that she is tolerating the [place]. She states that she is having some pain in abdominal from the antibiotics but she has been feeling better in the last 3 days.  Denies any nausea or vomiting. ? ?ALLERGIES:  has No Known Allergies. ? ?MEDICATIONS:  ?Current  Outpatient Medications  ?Medication Sig Dispense Refill  ? lidocaine-prilocaine (EMLA) cream Apply topically.    ? ?No current facility-administered medications for this visit.  ? ?Facility-Administered Medications Ordered in Other Visits  ?Medication Dose Route Frequency Provider Last Rate Last Admin  ? sodium chloride flush (NS) 0.9 % injection 10 mL  10 mL Intracatheter PRN Nicholas Lose, MD   10 mL at 04/16/22 1200  ? ? ?PHYSICAL EXAMINATION: ?ECOG PERFORMANCE STATUS: 1 - Symptomatic but completely ambulatory ? ?Vitals:  ? 04/16/22 0952  ?BP: 132/85  ?Pulse: 94  ?Resp: 18  ?Temp: (!) 97.2 ?F (36.2 ?C)  ?SpO2: 100%  ? ?Filed Weights  ? 04/16/22 0952  ?Weight: 142 lb 4.8 oz (64.5 kg)  ? ?  ? ?LABORATORY DATA:  ?I have reviewed the data as listed ? ?  Latest Ref Rng & Units 04/16/2022  ?  9:34 AM 03/26/2022  ? 11:33 AM 03/04/2022  ? 11:29 AM  ?CMP  ?Glucose 70 - 99 mg/dL 97   127   109    ?BUN 6 - 20 mg/dL 13   10   11     ?Creatinine 0.44 - 1.00 mg/dL 0.67   0.67   0.67    ?Sodium 135 - 145 mmol/L 142   141   140    ?Potassium 3.5 - 5.1 mmol/L 3.9   4.1   3.9    ?Chloride 98 - 111 mmol/L 107   108   105    ?CO2 22 -  32 mmol/L 29   28   29     ?Calcium 8.9 - 10.3 mg/dL 9.0   9.2   9.3    ?Total Protein 6.5 - 8.1 g/dL 5.9   6.4   6.4    ?Total Bilirubin 0.3 - 1.2 mg/dL 0.4   0.4   0.3    ?Alkaline Phos 38 - 126 U/L 64   50   65    ?AST 15 - 41 U/L 19   17   21     ?ALT 0 - 44 U/L 13   12   18     ? ? ?Lab Results  ?Component Value Date  ? WBC 12.0 (H) 04/16/2022  ? HGB 9.6 (L) 04/16/2022  ? HCT 29.5 (L) 04/16/2022  ? MCV 77.8 (L) 04/16/2022  ? PLT 282 04/16/2022  ? NEUTROABS 2.7 04/16/2022  ? ? ?ASSESSMENT & PLAN:  ?Malignant neoplasm of upper-outer quadrant of left breast in female, estrogen receptor positive ([place]) ?05/06/2021: Large circumscribed multicystic mass left breast 9 to 10 cm with multiple left axillary lymph nodes with cortical thickening: biopsy 3 o'clock position: Grade 2 IDC, ER 10%, PR 0%, Ki-67 60%, HER2  negative ?Right breast: 0.4 cm mass at 9:30 position: Benign on biopsy ?  ?Treatment plan: ?1.  Neoadjuvant chemotherapy with Adriamycin and Cytoxan along with pembrolizumab followed by Taxol carboplatin and pembrolizumab completed 11/27/2021 (pembrolizumab maintenance for 1 year) ?2. 01/01/2022 bilateral Mastectomies: No residual cancer, 0/11 LN neg ?3.  Adjuvant radiation 02/23/2022-04/07/2022 ?4.  Followed by adjuvant antiestrogen therapy (since she is 10% ER positive) ?------------------------------------------------------------------------------------------------------------------------------------------------------ ?Current Treatment; pembrolizumab maintenance therapy (last treatment will be on 05/27/2022) ?  ?Letrozole counseling: We discussed the risks and benefits of anti-estrogen therapy with aromatase inhibitors. These include but not limited to insomnia, hot flashes, mood changes, vaginal dryness, bone density loss, and weight gain. We strongly believe that the benefits far outweigh the risks. Patient understands these risks and consented to starting treatment. Planned treatment duration is 5-7 years. ?  ? ?Microcytic anemia: We will obtain iron studies today.  If she is significantly iron deficient we can consider adding IV iron to her next infusion. ? ?Return to clinic every 3 weeks for pembrolizumab but every 6 weeks for follow-up with me.  I will see her with the last treatment. ? ? ? ?Orders Placed This Encounter  ?Procedures  ? Iron and Iron Binding Capacity ([facility] only)  ?  Standing Status:   Future  ?  Standing Expiration Date:   04/17/2023  ? Ferritin  ?  Standing Status:   Future  ?  Standing Expiration Date:   04/16/2023  ? ?The patient has a good understanding of the overall plan. she agrees with it. she will call with any problems that may develop before the next visit here. ?Total time spent: 30 mins including face to face time and time spent for planning, charting and co-ordination of care ? ?  Harriette Ohara, MD ?04/16/22 ? ? ? I Gardiner Coins am scribing for Dr. Lindi Adie ? ?I have reviewed the above documentation for accuracy and completeness, and I agree with the above. ?  ?

## 2022-04-05 ENCOUNTER — Ambulatory Visit
Admission: RE | Admit: 2022-04-05 | Discharge: 2022-04-05 | Disposition: A | Discharge status: Home / Self Care | Payer: Commercial Managed Care - HMO | Source: Ambulatory Visit | Attending: Radiation Oncology | Admitting: Radiation Oncology

## 2022-04-05 ENCOUNTER — Ambulatory Visit: Payer: Commercial Managed Care - HMO

## 2022-04-05 ENCOUNTER — Other Ambulatory Visit: Payer: Self-pay

## 2022-04-05 DIAGNOSIS — Z510 Encounter for antineoplastic radiation therapy: Secondary | ICD-10-CM | POA: Diagnosis not present

## 2022-04-06 ENCOUNTER — Ambulatory Visit
Admission: RE | Admit: 2022-04-06 | Discharge: 2022-04-06 | Disposition: A | Discharge status: Home / Self Care | Payer: Commercial Managed Care - HMO | Source: Ambulatory Visit | Attending: Radiation Oncology | Admitting: Radiation Oncology

## 2022-04-06 ENCOUNTER — Encounter: Payer: Self-pay | Admitting: Radiation Oncology

## 2022-04-06 ENCOUNTER — Ambulatory Visit: Payer: Commercial Managed Care - HMO

## 2022-04-06 DIAGNOSIS — Z510 Encounter for antineoplastic radiation therapy: Secondary | ICD-10-CM | POA: Diagnosis not present

## 2022-04-07 ENCOUNTER — Ambulatory Visit: Payer: Commercial Managed Care - HMO

## 2022-04-07 ENCOUNTER — Ambulatory Visit
Admission: RE | Admit: 2022-04-07 | Discharge: 2022-04-07 | Disposition: A | Discharge status: Home / Self Care | Payer: Commercial Managed Care - HMO | Source: Ambulatory Visit | Attending: Radiation Oncology | Admitting: Radiation Oncology

## 2022-04-07 ENCOUNTER — Encounter: Payer: Self-pay | Admitting: *Deleted

## 2022-04-07 ENCOUNTER — Other Ambulatory Visit: Payer: Self-pay

## 2022-04-07 DIAGNOSIS — Z510 Encounter for antineoplastic radiation therapy: Secondary | ICD-10-CM | POA: Diagnosis not present

## 2022-04-08 ENCOUNTER — Ambulatory Visit: Payer: Commercial Managed Care - HMO

## 2022-04-16 ENCOUNTER — Inpatient Hospital Stay (HOSPITAL_BASED_OUTPATIENT_CLINIC_OR_DEPARTMENT_OTHER): Payer: Commercial Managed Care - HMO | Admitting: Hematology and Oncology

## 2022-04-16 ENCOUNTER — Inpatient Hospital Stay: Payer: Commercial Managed Care - HMO

## 2022-04-16 ENCOUNTER — Other Ambulatory Visit: Payer: Managed Care, Other (non HMO)

## 2022-04-16 ENCOUNTER — Inpatient Hospital Stay: Payer: Commercial Managed Care - HMO | Attending: Hematology and Oncology

## 2022-04-16 ENCOUNTER — Other Ambulatory Visit: Payer: Self-pay | Admitting: Hematology and Oncology

## 2022-04-16 ENCOUNTER — Other Ambulatory Visit: Payer: Self-pay

## 2022-04-16 VITALS — BP 132/85 | HR 94 | Temp 97.2°F | Resp 18 | Ht 64.0 in | Wt 142.3 lb

## 2022-04-16 DIAGNOSIS — Z170 Estrogen receptor positive status [ER+]: Secondary | ICD-10-CM | POA: Diagnosis not present

## 2022-04-16 DIAGNOSIS — C50412 Malignant neoplasm of upper-outer quadrant of left female breast: Secondary | ICD-10-CM

## 2022-04-16 DIAGNOSIS — D509 Iron deficiency anemia, unspecified: Secondary | ICD-10-CM | POA: Diagnosis not present

## 2022-04-16 DIAGNOSIS — Z5112 Encounter for antineoplastic immunotherapy: Secondary | ICD-10-CM | POA: Insufficient documentation

## 2022-04-16 DIAGNOSIS — Z79899 Other long term (current) drug therapy: Secondary | ICD-10-CM | POA: Diagnosis not present

## 2022-04-16 DIAGNOSIS — Z95828 Presence of other vascular implants and grafts: Secondary | ICD-10-CM

## 2022-04-16 LAB — COMPREHENSIVE METABOLIC PANEL
ALT: 13 U/L (ref 0–44)
AST: 19 U/L (ref 15–41)
Albumin: 3.6 g/dL (ref 3.5–5.0)
Alkaline Phosphatase: 64 U/L (ref 38–126)
Anion gap: 6 (ref 5–15)
BUN: 13 mg/dL (ref 6–20)
CO2: 29 mmol/L (ref 22–32)
Calcium: 9 mg/dL (ref 8.9–10.3)
Chloride: 107 mmol/L (ref 98–111)
Creatinine, Ser: 0.67 mg/dL (ref 0.44–1.00)
GFR, Estimated: >60 mL/min (ref >60)
Glucose, Bld: 97 mg/dL (ref 70–99)
Potassium: 3.9 mmol/L (ref 3.5–5.1)
Sodium: 142 mmol/L (ref 135–145)
Total Bilirubin: 0.4 mg/dL (ref 0.3–1.2)
Total Protein: 5.9 g/dL — ABNORMAL LOW (ref 6.5–8.1)

## 2022-04-16 LAB — CBC WITH DIFFERENTIAL/PLATELET
Abs Immature Granulocytes: 0.02 10*3/uL (ref 0.00–0.07)
Basophils Absolute: 0.1 10*3/uL (ref 0.0–0.1)
Basophils Relative: 0 %
Eosinophils Absolute: 7.8 10*3/uL — ABNORMAL HIGH (ref 0.0–0.5)
Eosinophils Relative: 66 %
HCT: 29.5 % — ABNORMAL LOW (ref 36.0–46.0)
Hemoglobin: 9.6 g/dL — ABNORMAL LOW (ref 12.0–15.0)
Immature Granulocytes: 0 %
Lymphocytes Relative: 6 %
Lymphs Abs: 0.8 10*3/uL (ref 0.7–4.0)
MCH: 25.3 pg — ABNORMAL LOW (ref 26.0–34.0)
MCHC: 32.5 g/dL (ref 30.0–36.0)
MCV: 77.8 fL — ABNORMAL LOW (ref 80.0–100.0)
Monocytes Absolute: 0.7 10*3/uL (ref 0.1–1.0)
Monocytes Relative: 6 %
Neutro Abs: 2.7 10*3/uL (ref 1.7–7.7)
Neutrophils Relative %: 22 %
Platelets: 282 10*3/uL (ref 150–400)
RBC: 3.79 MIL/uL — ABNORMAL LOW (ref 3.87–5.11)
RDW: 16.8 % — ABNORMAL HIGH (ref 11.5–15.5)
WBC: 12 10*3/uL — ABNORMAL HIGH (ref 4.0–10.5)
nRBC: 0 % (ref 0.0–0.2)

## 2022-04-16 LAB — IRON AND IRON BINDING CAPACITY (CC-WL,HP ONLY)
Iron: 23 ug/dL — ABNORMAL LOW (ref 28–170)
Saturation Ratios: 7 % — ABNORMAL LOW (ref 10.4–31.8)
TIBC: 354 ug/dL (ref 250–450)
UIBC: 331 ug/dL (ref 148–442)

## 2022-04-16 LAB — TSH: TSH: 2.713 u[IU]/mL (ref 0.350–4.500)

## 2022-04-16 LAB — FERRITIN: Ferritin: 14 ng/mL (ref 11–307)

## 2022-04-16 MED ORDER — SODIUM CHLORIDE 0.9% FLUSH
10.0000 mL | Freq: Once | INTRAVENOUS | Status: AC
  Administered 2022-04-16: 10 mL

## 2022-04-16 MED ORDER — SODIUM CHLORIDE 0.9 % IV SOLN
200.0000 mg | Freq: Once | INTRAVENOUS | Status: AC
  Administered 2022-04-16: 200 mg via INTRAVENOUS
  Filled 2022-04-16: qty 200

## 2022-04-16 MED ORDER — SODIUM CHLORIDE 0.9% FLUSH
10.0000 mL | INTRAVENOUS | Status: DC | PRN
  Administered 2022-04-16: 10 mL

## 2022-04-16 MED ORDER — HEPARIN SOD (PORK) LOCK FLUSH 100 UNIT/ML IV SOLN
500.0000 [IU] | Freq: Once | INTRAVENOUS | Status: AC | PRN
  Administered 2022-04-16: 500 [IU]

## 2022-04-16 MED ORDER — SODIUM CHLORIDE 0.9 % IV SOLN: Freq: Once | INTRAVENOUS | Status: AC

## 2022-04-16 NOTE — Patient Instructions (Signed)
[facility]COL[facility]  Discharge Instructions: °Thank you for choosing [facility] to provide your oncology and hematology care.  ° °If you have a lab appointment with the Cancer Center, please go directly to the Cancer Center and check in at the registration area. °  °Wear comfortable clothing and clothing appropriate for easy access to any Portacath or PICC line.  ° °We strive to give you quality time with your provider. You may need to reschedule your appointment if you arrive late (15 or more minutes).  Arriving late affects you and other patients whose appointments are after yours.  Also, if you miss three or more appointments without notifying the office, you may be dismissed from the clinic at the provider’s discretion.    °  °For prescription refill requests, have your pharmacy contact our office and allow 72 hours for refills to be completed.   ° °Today you received the following chemotherapy and/or immunotherapy agent: Pembrolizumab (Keytruda) °  °To help prevent nausea and vomiting after your treatment, we encourage you to take your nausea medication as directed. ° °BELOW ARE SYMPTOMS THAT SHOULD BE REPORTED IMMEDIATELY: °*FEVER GREATER THAN 100.4 F (38 °C) OR HIGHER °*CHILLS OR SWEATING °*NAUSEA AND VOMITING THAT IS NOT CONTROLLED WITH YOUR NAUSEA MEDICATION °*UNUSUAL SHORTNESS OF BREATH °*UNUSUAL BRUISING OR BLEEDING °*URINARY PROBLEMS (pain or burning when urinating, or frequent urination) °*BOWEL PROBLEMS (unusual diarrhea, constipation, pain near the anus) °TENDERNESS IN MOUTH AND THROAT WITH OR WITHOUT PRESENCE OF ULCERS (sore throat, sores in mouth, or a toothache) °UNUSUAL RASH, SWELLING OR PAIN  °UNUSUAL VAGINAL DISCHARGE OR ITCHING  ° °Items with * indicate a potential emergency and should be followed up as soon as possible or go to the Emergency Department if any problems should occur. ° °Please show the CHEMOTHERAPY ALERT CARD or IMMUNOTHERAPY ALERT CARD at  check-in to the Emergency Department and triage nurse. ° °Should you have questions after your visit or need to cancel or reschedule your appointment, please contact [facility]  Dept: [phone]  and follow the prompts.  Office hours are 8:00 a.m. to 4:30 p.m. Monday - Friday. Please note that voicemails left after 4:00 p.m. may not be returned until the following business day.  We are closed weekends and major holidays. You have access to a nurse at all times for urgent questions. Please call the main number to the clinic Dept: [phone] and follow the prompts. ° ° °For any non-urgent questions, you may also contact your provider using MyChart. We now offer e-Visits for anyone 18 and older to request care online for non-urgent symptoms. For details visit [email or web address]. °  °Also download the MyChart app! Go to the app store, search "MyChart", open the app, select [facility], and log in with your MyChart username and password. ° °Due to Covid, a mask is required upon entering the hospital/clinic. If you do not have a mask, one will be given to you upon arrival. For doctor visits, patients may have 1 support person aged 18 or older with them. For treatment visits, patients cannot have anyone with them due to current Covid guidelines and our immunocompromised population.  ° °

## 2022-04-16 NOTE — Assessment & Plan Note (Signed)
05/06/2021:?Large circumscribed multicystic mass left breast 9 to 10 cm with multiple left axillary lymph nodes with cortical thickening: biopsy 3 o'clock position: Grade 2 IDC, ER 10%, PR 0%, Ki-67 60%, HER2 negative ?Right breast: 0.4 cm mass at 9:30 position: Benign on biopsy ?? ?Treatment plan: ?1.??Neoadjuvant chemotherapy with Adriamycin and Cytoxan along with pembrolizumab followed by Taxol carboplatin and pembrolizumab completed 11/27/2021 (pembrolizumab maintenance for 1 year) ?2.?01/01/2022 bilateral Mastectomies: No residual cancer, 0/11 LN neg ?3.??Adjuvant radiation 02/23/2022-04/07/2022 ?4.??Followed by adjuvant antiestrogen therapy (since she is 10% ER positive) ?------------------------------------------------------------------------------------------------------------------------------------------------------ ?Current Treatment;?pembrolizumab maintenance therapy ?Undergoing adjuvant radiation ??After radiation is completed we will start antiestrogen therapy. ?Return to clinic every 3 weeks for pembrolizumab but every 6 weeks for follow-up with me. ?Tolerating it extremely well without any problems or concerns. ?

## 2022-04-17 LAB — T4: T4, Total: 10.7 ug/dL (ref 4.5–12.0)

## 2022-04-19 ENCOUNTER — Telehealth: Payer: Self-pay | Admitting: Hematology and Oncology

## 2022-04-19 ENCOUNTER — Other Ambulatory Visit: Payer: Self-pay | Admitting: Hematology and Oncology

## 2022-04-19 NOTE — Telephone Encounter (Signed)
Scheduled appointment per 4/21 los. Left message. Patient will be mailed an updated calendar. ?

## 2022-04-26 ENCOUNTER — Encounter: Payer: Self-pay | Admitting: Hematology and Oncology

## 2022-05-03 ENCOUNTER — Encounter: Payer: Self-pay | Admitting: Hematology and Oncology

## 2022-05-03 NOTE — Progress Notes (Signed)
? ?                                                                                                                                                          ?  Patient Name: Sheena Mcdaniel ?MRN: [id number] ?DOB: 11-20-1971 ?Referring Physician: Harlow Ohms ?Date of Service: 04/07/2022 ?[name] [place] ? ?                                                      End Of Treatment Note ? ?Diagnoses: C50.412-Malignant neoplasm of upper-outer quadrant of left female breast ? 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 ? ?Intent: Curative ? ?Radiation Treatment Dates: 02/22/2022 through 04/07/2022 ?Site Technique Total Dose (Gy) Dose per Fx (Gy) Completed Fx Beam Energies  ?Chest Wall, Left: CW_L 3D 50.4/50.4 1.8 28/28 6X  ?Chest Wall, Left: CW_L_SCLV 3D 50.4/50.4 1.8 28/28 6X, 10X  ?Chest Wall, Left: CW_L_Bst Electron 10/10 2 5/5 6E  ? ?Narrative: The patient tolerated radiation therapy relatively well. She developed fatigue and anticipated skin changes in the treatment field.  ? ?Plan: The patient will receive a call in about one month from the radiation oncology department. She will continue follow up with Dr. Lindi Adie as well.  ? ?________________________________________________ ? ? ? ?Carola Rhine, PAC  ?

## 2022-05-07 ENCOUNTER — Other Ambulatory Visit: Payer: Self-pay

## 2022-05-07 ENCOUNTER — Inpatient Hospital Stay: Payer: Commercial Managed Care - HMO

## 2022-05-07 ENCOUNTER — Inpatient Hospital Stay: Payer: Commercial Managed Care - HMO | Attending: Hematology and Oncology

## 2022-05-07 ENCOUNTER — Other Ambulatory Visit: Payer: Managed Care, Other (non HMO)

## 2022-05-07 VITALS — BP 110/75 | HR 68 | Temp 98.1°F | Resp 16 | Wt 141.8 lb

## 2022-05-07 DIAGNOSIS — C50412 Malignant neoplasm of upper-outer quadrant of left female breast: Secondary | ICD-10-CM | POA: Diagnosis present

## 2022-05-07 DIAGNOSIS — Z95828 Presence of other vascular implants and grafts: Secondary | ICD-10-CM

## 2022-05-07 DIAGNOSIS — Z79899 Other long term (current) drug therapy: Secondary | ICD-10-CM | POA: Diagnosis not present

## 2022-05-07 DIAGNOSIS — Z5112 Encounter for antineoplastic immunotherapy: Secondary | ICD-10-CM | POA: Diagnosis present

## 2022-05-07 DIAGNOSIS — Z170 Estrogen receptor positive status [ER+]: Secondary | ICD-10-CM | POA: Insufficient documentation

## 2022-05-07 DIAGNOSIS — D509 Iron deficiency anemia, unspecified: Secondary | ICD-10-CM | POA: Diagnosis not present

## 2022-05-07 LAB — COMPREHENSIVE METABOLIC PANEL
ALT: 18 U/L (ref 0–44)
AST: 25 U/L (ref 15–41)
Albumin: 3.3 g/dL — ABNORMAL LOW (ref 3.5–5.0)
Alkaline Phosphatase: 64 U/L (ref 38–126)
Anion gap: 4 — ABNORMAL LOW (ref 5–15)
BUN: 12 mg/dL (ref 6–20)
CO2: 29 mmol/L (ref 22–32)
Calcium: 8.9 mg/dL (ref 8.9–10.3)
Chloride: 107 mmol/L (ref 98–111)
Creatinine, Ser: 0.73 mg/dL (ref 0.44–1.00)
GFR, Estimated: >60 mL/min (ref >60)
Glucose, Bld: 112 mg/dL — ABNORMAL HIGH (ref 70–99)
Potassium: 3.9 mmol/L (ref 3.5–5.1)
Sodium: 140 mmol/L (ref 135–145)
Total Bilirubin: 0.5 mg/dL (ref 0.3–1.2)
Total Protein: 5.5 g/dL — ABNORMAL LOW (ref 6.5–8.1)

## 2022-05-07 LAB — CBC WITH DIFFERENTIAL/PLATELET
Abs Immature Granulocytes: 0.01 10*3/uL (ref 0.00–0.07)
Basophils Absolute: 0 10*3/uL (ref 0.0–0.1)
Basophils Relative: 0 %
Eosinophils Absolute: 4.3 10*3/uL — ABNORMAL HIGH (ref 0.0–0.5)
Eosinophils Relative: 56 %
HCT: 27.8 % — ABNORMAL LOW (ref 36.0–46.0)
Hemoglobin: 8.8 g/dL — ABNORMAL LOW (ref 12.0–15.0)
Immature Granulocytes: 0 %
Lymphocytes Relative: 10 %
Lymphs Abs: 0.8 10*3/uL (ref 0.7–4.0)
MCH: 25 pg — ABNORMAL LOW (ref 26.0–34.0)
MCHC: 31.7 g/dL (ref 30.0–36.0)
MCV: 79 fL — ABNORMAL LOW (ref 80.0–100.0)
Monocytes Absolute: 0.5 10*3/uL (ref 0.1–1.0)
Monocytes Relative: 6 %
Neutro Abs: 2.3 10*3/uL (ref 1.7–7.7)
Neutrophils Relative %: 28 %
Platelets: 326 10*3/uL (ref 150–400)
RBC: 3.52 MIL/uL — ABNORMAL LOW (ref 3.87–5.11)
RDW: 17.3 % — ABNORMAL HIGH (ref 11.5–15.5)
WBC: 7.9 10*3/uL (ref 4.0–10.5)
nRBC: 0 % (ref 0.0–0.2)

## 2022-05-07 LAB — TSH: TSH: 2.849 u[IU]/mL (ref 0.350–4.500)

## 2022-05-07 MED ORDER — SODIUM CHLORIDE 0.9 % IV SOLN: Freq: Once | INTRAVENOUS | Status: DC

## 2022-05-07 MED ORDER — SODIUM CHLORIDE 0.9% FLUSH
10.0000 mL | INTRAVENOUS | Status: DC | PRN
  Administered 2022-05-07: 10 mL

## 2022-05-07 MED ORDER — HEPARIN SOD (PORK) LOCK FLUSH 100 UNIT/ML IV SOLN
500.0000 [IU] | Freq: Once | INTRAVENOUS | Status: AC | PRN
  Administered 2022-05-07: 500 [IU]

## 2022-05-07 MED ORDER — SODIUM CHLORIDE 0.9 % IV SOLN
200.0000 mg | Freq: Once | INTRAVENOUS | Status: AC
  Administered 2022-05-07: 200 mg via INTRAVENOUS
  Filled 2022-05-07: qty 200

## 2022-05-07 MED ORDER — SODIUM CHLORIDE 0.9 % IV SOLN: Freq: Once | INTRAVENOUS | Status: AC

## 2022-05-07 MED ORDER — SODIUM CHLORIDE 0.9% FLUSH
10.0000 mL | Freq: Once | INTRAVENOUS | Status: AC
  Administered 2022-05-07: 10 mL

## 2022-05-07 MED ORDER — SODIUM CHLORIDE 0.9 % IV SOLN
400.0000 mg | Freq: Once | INTRAVENOUS | Status: AC
  Administered 2022-05-07: 400 mg via INTRAVENOUS
  Filled 2022-05-07: qty 20

## 2022-05-07 NOTE — Patient Instructions (Addendum)
[facility]  Discharge Instructions: ?Thank you for choosing [facility] to provide your oncology and hematology care.  ? ?If you have a lab appointment with the [facility], please go directly to the [facility] and check in at the registration area. ?  ?Wear comfortable clothing and clothing appropriate for easy access to any Portacath or PICC line.  ? ?We strive to give you quality time with your provider. You may need to reschedule your appointment if you arrive late (15 or more minutes).  Arriving late affects you and other patients whose appointments are after yours.  Also, if you miss three or more appointments without notifying the office, you may be dismissed from the clinic at the provider?s discretion.    ?  ?For prescription refill requests, have your pharmacy contact our office and allow 72 hours for refills to be completed.   ? ?Today you received the following chemotherapy and/or immunotherapy agents: Keytruda.    ?  ?To help prevent nausea and vomiting after your treatment, we encourage you to take your nausea medication as directed. ? ?BELOW ARE SYMPTOMS THAT SHOULD BE REPORTED IMMEDIATELY: ?*FEVER GREATER THAN 100.4 F (38 ?C) OR HIGHER ?*CHILLS OR SWEATING ?*NAUSEA AND VOMITING THAT IS NOT CONTROLLED WITH YOUR NAUSEA MEDICATION ?*UNUSUAL SHORTNESS OF BREATH ?*UNUSUAL BRUISING OR BLEEDING ?*URINARY PROBLEMS (pain or burning when urinating, or frequent urination) ?*BOWEL PROBLEMS (unusual diarrhea, constipation, pain near the anus) ?TENDERNESS IN MOUTH AND THROAT WITH OR WITHOUT PRESENCE OF ULCERS (sore throat, sores in mouth, or a toothache) ?UNUSUAL RASH, SWELLING OR PAIN  ?UNUSUAL VAGINAL DISCHARGE OR ITCHING  ? ?Items with * indicate a potential emergency and should be followed up as soon as possible or go to the Emergency Department if any problems should occur. ? ?Please show the CHEMOTHERAPY ALERT CARD or IMMUNOTHERAPY ALERT CARD at check-in to  the Emergency Department and triage nurse. ? ?Should you have questions after your visit or need to cancel or reschedule your appointment, please contact [facility]  Dept: [phone]  and follow the prompts.  Office hours are 8:00 a.m. to 4:30 p.m. Monday - Friday. Please note that voicemails left after 4:00 p.m. may not be returned until the following business day.  We are closed weekends and major holidays. You have access to a nurse at all times for urgent questions. Please call the main number to the clinic Dept: [phone] and follow the prompts. ? ? ?For any non-urgent questions, you may also contact your provider using MyChart. We now offer e-Visits for anyone 72 and older to request care online for non-urgent symptoms. For details visit [email or web address]. ?  ?Also download the MyChart app! Go to the app store, search "MyChart", open the app, select [facility], and log in with your MyChart username and password. ? ?Due to Covid, a mask is required upon entering the hospital/clinic. If you do not have a mask, one will be given to you upon arrival. For doctor visits, patients may have 1 support person aged 4 or older with them. For treatment visits, patients cannot have anyone with them due to current Covid guidelines and our immunocompromised population.  ? ?Iron Sucrose Injection ?What is this medication? ?IRON SUCROSE (EYE ern SOO krose) treats low levels of iron (iron deficiency anemia) in people with kidney disease. Iron is a mineral that plays an important role in making red blood cells, which carry oxygen from your lungs to the rest  of your body. ?This medicine may be used for other purposes; ask your health care provider or pharmacist if you have questions. ?COMMON BRAND NAME(S): Venofer ?What should I tell my care team before I take this medication? ?They need to know if you have any of these conditions: ?Anemia not caused by low iron levels ?Heart  disease ?High levels of iron in the blood ?Kidney disease ?Liver disease ?An unusual or allergic reaction to iron, other medications, foods, dyes, or preservatives ?Pregnant or trying to get pregnant ?Breast-feeding ?How should I use this medication? ?This medication is for infusion into a vein. It is given in a hospital or clinic setting. ?Talk to your care team about the use of this medication in children. While this medication may be prescribed for children as young as 2 years for selected conditions, precautions do apply. ?Overdosage: If you think you have taken too much of this medicine contact a poison control center or emergency room at once. ?NOTE: This medicine is only for you. Do not share this medicine with others. ?What if I miss a dose? ?It is important not to miss your dose. Call your care team if you are unable to keep an appointment. ?What may interact with this medication? ?Do not take this medication with any of the following: ?Deferoxamine ?Dimercaprol ?Other iron products ?This medication may also interact with the following: ?Chloramphenicol ?Deferasirox ?This list may not describe all possible interactions. Give your health care provider a list of all the medicines, herbs, non-prescription drugs, or dietary supplements you use. Also tell them if you smoke, drink alcohol, or use illegal drugs. Some items may interact with your medicine. ?What should I watch for while using this medication? ?Visit your care team regularly. Tell your care team if your symptoms do not start to get better or if they get worse. You may need blood work done while you are taking this medication. ?You may need to follow a special diet. Talk to your care team. Foods that contain iron include: whole grains/cereals, dried fruits, beans, or peas, leafy green vegetables, and organ meats (liver, kidney). ?What side effects may I notice from receiving this medication? ?Side effects that you should report to your care team as  soon as possible: ?Allergic reactions--skin rash, itching, hives, swelling of the face, lips, tongue, or throat ?Low blood pressure--dizziness, feeling faint or lightheaded, blurry vision ?Shortness of breath ?Side effects that usually do not require medical attention (report to your care team if they continue or are bothersome): ?Flushing ?Headache ?Joint pain ?Muscle pain ?Nausea ?Pain, redness, or irritation at injection site ?This list may not describe all possible side effects. Call your doctor for medical advice about side effects. You may report side effects to FDA at 1-800-FDA-1088. ?Where should I keep my medication? ?This medication is given in a hospital or clinic and will not be stored at home. ?NOTE: This sheet is a summary. It may not cover all possible information. If you have questions about this medicine, talk to your doctor, pharmacist, or health care provider. ?? 2023 Elsevier/Gold Standard (2021-05-08 00:00:00) ? ?

## 2022-05-08 LAB — T4: T4, Total: 9.3 ug/dL (ref 4.5–12.0)

## 2022-05-10 ENCOUNTER — Ambulatory Visit
Admission: RE | Admit: 2022-05-10 | Discharge: 2022-05-10 | Disposition: A | Discharge status: Home / Self Care | Payer: Commercial Managed Care - HMO | Source: Ambulatory Visit | Attending: Radiation Oncology | Admitting: Radiation Oncology

## 2022-05-10 DIAGNOSIS — Z170 Estrogen receptor positive status [ER+]: Secondary | ICD-10-CM

## 2022-05-12 NOTE — Progress Notes (Signed)
?  Radiation Oncology         [phone] ?________________________________ ? ?Name: Sheena Mcdaniel MRN: [id number]  ?Date of Service: 05/10/2022  DOB: Sep 23, 1971 ? ?Post Treatment Telephone Note ? 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 ? ?Intent: Curative ? ?Radiation Treatment Dates: 02/22/2022 through 04/07/2022 ?Site Technique Total Dose (Gy) Dose per Fx (Gy) Completed Fx Beam Energies  ?Chest Wall, Left: CW_L 3D 50.4/50.4 1.8 28/28 6X  ?Chest Wall, Left: CW_L_SCLV 3D 50.4/50.4 1.8 28/28 6X, 10X  ?Chest Wall, Left: CW_L_Bst Electron 10/10 2 5/5 6E  ? ?Narrative: The patient tolerated radiation therapy relatively well. She developed fatigue and anticipated skin changes in the treatment field. Her skin changes have improved though some hyperpigmentation remains. ? ? ?Impression/Plan: ?1. 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 The patient has been doing well since completion of radiotherapy. We discussed that we would be happy to continue to follow her as needed, but she will also continue to follow up with Dr. Lindi Adie in medical oncology. She was counseled on skin care as well as measures to avoid sun exposure to this area which she is already doing. ? ? ? ? ? ?Carola Rhine, PAC  ? ? ? ? ?

## 2022-05-17 ENCOUNTER — Inpatient Hospital Stay: Payer: Commercial Managed Care - HMO | Admitting: Licensed Clinical Social Worker

## 2022-05-17 ENCOUNTER — Inpatient Hospital Stay: Payer: Commercial Managed Care - HMO

## 2022-05-28 ENCOUNTER — Inpatient Hospital Stay: Payer: Commercial Managed Care - HMO

## 2022-05-28 ENCOUNTER — Inpatient Hospital Stay: Payer: Commercial Managed Care - HMO | Attending: Hematology and Oncology

## 2022-05-28 ENCOUNTER — Encounter: Payer: Self-pay | Admitting: Adult Health

## 2022-05-28 ENCOUNTER — Other Ambulatory Visit: Payer: Self-pay

## 2022-05-28 ENCOUNTER — Other Ambulatory Visit: Payer: Managed Care, Other (non HMO)

## 2022-05-28 ENCOUNTER — Encounter: Payer: Self-pay | Admitting: *Deleted

## 2022-05-28 ENCOUNTER — Inpatient Hospital Stay (HOSPITAL_BASED_OUTPATIENT_CLINIC_OR_DEPARTMENT_OTHER): Payer: Commercial Managed Care - HMO | Admitting: Adult Health

## 2022-05-28 VITALS — BP 131/68 | HR 79 | Temp 97.9°F | Resp 16 | Ht 64.0 in | Wt 138.4 lb

## 2022-05-28 VITALS — BP 110/67 | HR 70 | Temp 98.1°F | Resp 16

## 2022-05-28 DIAGNOSIS — Z170 Estrogen receptor positive status [ER+]: Secondary | ICD-10-CM

## 2022-05-28 DIAGNOSIS — Z79899 Other long term (current) drug therapy: Secondary | ICD-10-CM | POA: Insufficient documentation

## 2022-05-28 DIAGNOSIS — Z95828 Presence of other vascular implants and grafts: Secondary | ICD-10-CM

## 2022-05-28 DIAGNOSIS — D509 Iron deficiency anemia, unspecified: Secondary | ICD-10-CM | POA: Diagnosis not present

## 2022-05-28 DIAGNOSIS — C50412 Malignant neoplasm of upper-outer quadrant of left female breast: Secondary | ICD-10-CM

## 2022-05-28 DIAGNOSIS — Z5112 Encounter for antineoplastic immunotherapy: Secondary | ICD-10-CM | POA: Insufficient documentation

## 2022-05-28 LAB — CBC WITH DIFFERENTIAL/PLATELET
Abs Immature Granulocytes: 0.03 10*3/uL (ref 0.00–0.07)
Basophils Absolute: 0.1 10*3/uL (ref 0.0–0.1)
Basophils Relative: 0 %
Eosinophils Absolute: 10.5 10*3/uL — ABNORMAL HIGH (ref 0.0–0.5)
Eosinophils Relative: 71 %
HCT: 32.6 % — ABNORMAL LOW (ref 36.0–46.0)
Hemoglobin: 10.5 g/dL — ABNORMAL LOW (ref 12.0–15.0)
Immature Granulocytes: 0 %
Lymphocytes Relative: 7 %
Lymphs Abs: 1 10*3/uL (ref 0.7–4.0)
MCH: 27.1 pg (ref 26.0–34.0)
MCHC: 32.2 g/dL (ref 30.0–36.0)
MCV: 84 fL (ref 80.0–100.0)
Monocytes Absolute: 0.6 10*3/uL (ref 0.1–1.0)
Monocytes Relative: 4 %
Neutro Abs: 2.6 10*3/uL (ref 1.7–7.7)
Neutrophils Relative %: 18 %
Platelets: 301 10*3/uL (ref 150–400)
RBC: 3.88 MIL/uL (ref 3.87–5.11)
RDW: 20.3 % — ABNORMAL HIGH (ref 11.5–15.5)
WBC: 14.7 10*3/uL — ABNORMAL HIGH (ref 4.0–10.5)
nRBC: 0 % (ref 0.0–0.2)

## 2022-05-28 LAB — COMPREHENSIVE METABOLIC PANEL
ALT: 15 U/L (ref 0–44)
AST: 24 U/L (ref 15–41)
Albumin: 3.4 g/dL — ABNORMAL LOW (ref 3.5–5.0)
Alkaline Phosphatase: 61 U/L (ref 38–126)
Anion gap: 4 — ABNORMAL LOW (ref 5–15)
BUN: 14 mg/dL (ref 6–20)
CO2: 29 mmol/L (ref 22–32)
Calcium: 9 mg/dL (ref 8.9–10.3)
Chloride: 106 mmol/L (ref 98–111)
Creatinine, Ser: 0.74 mg/dL (ref 0.44–1.00)
GFR, Estimated: >60 mL/min (ref >60)
Glucose, Bld: 119 mg/dL — ABNORMAL HIGH (ref 70–99)
Potassium: 4 mmol/L (ref 3.5–5.1)
Sodium: 139 mmol/L (ref 135–145)
Total Bilirubin: 0.3 mg/dL (ref 0.3–1.2)
Total Protein: 5.3 g/dL — ABNORMAL LOW (ref 6.5–8.1)

## 2022-05-28 LAB — TSH: TSH: 2.384 u[IU]/mL (ref 0.350–4.500)

## 2022-05-28 MED ORDER — SODIUM CHLORIDE 0.9 % IV SOLN: Freq: Once | INTRAVENOUS | Status: AC

## 2022-05-28 MED ORDER — SODIUM CHLORIDE 0.9% FLUSH
10.0000 mL | Freq: Once | INTRAVENOUS | Status: AC
  Administered 2022-05-28: 10 mL

## 2022-05-28 MED ORDER — SODIUM CHLORIDE 0.9 % IV SOLN
200.0000 mg | Freq: Once | INTRAVENOUS | Status: AC
  Administered 2022-05-28: 200 mg via INTRAVENOUS
  Filled 2022-05-28: qty 200

## 2022-05-28 MED ORDER — HEPARIN SOD (PORK) LOCK FLUSH 100 UNIT/ML IV SOLN
500.0000 [IU] | Freq: Once | INTRAVENOUS | Status: AC | PRN
  Administered 2022-05-28: 500 [IU]

## 2022-05-28 MED ORDER — SODIUM CHLORIDE 0.9 % IV SOLN
400.0000 mg | Freq: Once | INTRAVENOUS | Status: AC
  Administered 2022-05-28: 400 mg via INTRAVENOUS
  Filled 2022-05-28: qty 20

## 2022-05-28 MED ORDER — SODIUM CHLORIDE 0.9% FLUSH
10.0000 mL | INTRAVENOUS | Status: DC | PRN
  Administered 2022-05-28: 10 mL

## 2022-05-28 NOTE — Patient Instructions (Signed)
[facility]  Discharge Instructions: Thank you for choosing [facility] to provide your oncology and hematology care.   If you have a lab appointment with the [facility], please go directly to the [facility] and check in at the registration area.   Wear comfortable clothing and clothing appropriate for easy access to any Portacath or PICC line.   We strive to give you quality time with your provider. You may need to reschedule your appointment if you arrive late (15 or more minutes).  Arriving late affects you and other patients whose appointments are after yours.  Also, if you miss three or more appointments without notifying the office, you may be dismissed from the clinic at the provider's discretion.      For prescription refill requests, have your pharmacy contact our office and allow 72 hours for refills to be completed.    Today you received the following chemotherapy and/or immunotherapy agents: Keytruda    To help prevent nausea and vomiting after your treatment, we encourage you to take your nausea medication as directed.  BELOW ARE SYMPTOMS THAT SHOULD BE REPORTED IMMEDIATELY: *FEVER GREATER THAN 100.4 F (38 C) OR HIGHER *CHILLS OR SWEATING *NAUSEA AND VOMITING THAT IS NOT CONTROLLED WITH YOUR NAUSEA MEDICATION *UNUSUAL SHORTNESS OF BREATH *UNUSUAL BRUISING OR BLEEDING *URINARY PROBLEMS (pain or burning when urinating, or frequent urination) *BOWEL PROBLEMS (unusual diarrhea, constipation, pain near the anus) TENDERNESS IN MOUTH AND THROAT WITH OR WITHOUT PRESENCE OF ULCERS (sore throat, sores in mouth, or a toothache) UNUSUAL RASH, SWELLING OR PAIN  UNUSUAL VAGINAL DISCHARGE OR ITCHING   Items with * indicate a potential emergency and should be followed up as soon as possible or go to the Emergency Department if any problems should occur.  Please show the CHEMOTHERAPY ALERT CARD or IMMUNOTHERAPY ALERT CARD at check-in to the  Emergency Department and triage nurse.  Should you have questions after your visit or need to cancel or reschedule your appointment, please contact [facility]  Dept: [phone]  and follow the prompts.  Office hours are 8:00 a.m. to 4:30 p.m. Monday - Friday. Please note that voicemails left after 4:00 p.m. may not be returned until the following business day.  We are closed weekends and major holidays. You have access to a nurse at all times for urgent questions. Please call the main number to the clinic Dept: [phone] and follow the prompts.   For any non-urgent questions, you may also contact your provider using MyChart. We now offer e-Visits for anyone 30 and older to request care online for non-urgent symptoms. For details visit [email or web address].   Also download the MyChart app! Go to the app store, search "MyChart", open the app, select [facility], and log in with your MyChart username and password.  Due to Covid, a mask is required upon entering the hospital/clinic. If you do not have a mask, one will be given to you upon arrival. For doctor visits, patients may have 1 support person aged 48 or older with them. For treatment visits, patients cannot have anyone with them due to current Covid guidelines and our immunocompromised population.   Iron Sucrose Injection What is this medication? IRON SUCROSE (EYE ern SOO krose) treats low levels of iron (iron deficiency anemia) in people with kidney disease. Iron is a mineral that plays an important role in making red blood cells, which carry oxygen from your lungs to the rest of your  body. This medicine may be used for other purposes; ask your health care provider or pharmacist if you have questions. COMMON BRAND NAME(S): Venofer What should I tell my care team before I take this medication? They need to know if you have any of these conditions: Anemia not caused by low iron levels Heart disease High  levels of iron in the blood Kidney disease Liver disease An unusual or allergic reaction to iron, other medications, foods, dyes, or preservatives Pregnant or trying to get pregnant Breast-feeding How should I use this medication? This medication is for infusion into a vein. It is given in a hospital or clinic setting. Talk to your care team about the use of this medication in children. While this medication may be prescribed for children as young as 2 years for selected conditions, precautions do apply. Overdosage: If you think you have taken too much of this medicine contact a poison control center or emergency room at once. NOTE: This medicine is only for you. Do not share this medicine with others. What if I miss a dose? It is important not to miss your dose. Call your care team if you are unable to keep an appointment. What may interact with this medication? Do not take this medication with any of the following: Deferoxamine Dimercaprol Other iron products This medication may also interact with the following: Chloramphenicol Deferasirox This list may not describe all possible interactions. Give your health care provider a list of all the medicines, herbs, non-prescription drugs, or dietary supplements you use. Also tell them if you smoke, drink alcohol, or use illegal drugs. Some items may interact with your medicine. What should I watch for while using this medication? Visit your care team regularly. Tell your care team if your symptoms do not start to get better or if they get worse. You may need blood work done while you are taking this medication. You may need to follow a special diet. Talk to your care team. Foods that contain iron include: whole grains/cereals, dried fruits, beans, or peas, leafy green vegetables, and organ meats (liver, kidney). What side effects may I notice from receiving this medication? Side effects that you should report to your care team as soon as  possible: Allergic reactions--skin rash, itching, hives, swelling of the face, lips, tongue, or throat Low blood pressure--dizziness, feeling faint or lightheaded, blurry vision Shortness of breath Side effects that usually do not require medical attention (report to your care team if they continue or are bothersome): Flushing Headache Joint pain Muscle pain Nausea Pain, redness, or irritation at injection site This list may not describe all possible side effects. Call your doctor for medical advice about side effects. You may report side effects to FDA at 1-800-FDA-1088. Where should I keep my medication? This medication is given in a hospital or clinic and will not be stored at home. NOTE: This sheet is a summary. It may not cover all possible information. If you have questions about this medicine, talk to your doctor, pharmacist, or health care provider.  2023 Elsevier/Gold Standard (2021-05-08 00:00:00)

## 2022-05-28 NOTE — Assessment & Plan Note (Addendum)
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%, HER2 negative Right breast: 0.4 cm mass at 9:30 position: Benign on biopsy  Treatment plan: 1.Neoadjuvant chemotherapy with Adriamycin and Cytoxan along with pembrolizumab followed by Taxol carboplatin and pembrolizumab completed 11/27/2021 (pembrolizumab maintenance for 1 year) 2.01/01/2022 bilateral Mastectomies: No residual cancer, 0/11 LN neg 3.Adjuvant radiation 02/23/2022-04/07/2022 4.Followed by adjuvant antiestrogen therapy (since she is 10% ER positive) ------------------------------------------------------------------------------------------------------------------------------------------------------ Current Treatment;pembrolizumab maintenance therapy Sheena Mcdaniel will complete pembrolizumab treatment today.  She will return in 4 weeks to discuss her survivorship care plan and also whether to proceed with antiestrogen therapy or not.  Sheena Mcdaniel is doing well today she has no clinical signs of breast cancer recurrence.  I will send a message to Dr. Marlou Starks about getting her port removed.

## 2022-05-28 NOTE — Progress Notes (Signed)
[facility] Follow up:    Sheena Mcdaniel, Sheena N, DO [address] [place] [address]   DIAGNOSIS:  Cancer Staging  Malignant neoplasm of upper-outer quadrant of left breast in female, estrogen receptor positive ([facility]) Staging form: Breast, AJCC 8th Edition - Clinical stage from 05/12/2021: Stage IIIB (cT3, cN1, cM0, G2, ER-, PR-, HER2-) - Signed by Nicholas Lose, MD on 05/12/2021 Stage prefix: Initial diagnosis Histologic grading system: 3 grade system   SUMMARY OF ONCOLOGIC HISTORY: Oncology History  Malignant neoplasm of upper-outer quadrant of left breast in female, estrogen receptor positive ([facility])  05/06/2021 Initial Diagnosis   Large circumscribed multicystic mass left breast 9 to 10 cm with multiple left axillary lymph nodes with cortical thickening: biopsy 3 o'clock position: Grade 2 IDC, ER 10%, PR 0%, Ki-67 60%, HER2 negative Right breast: 0.4 cm mass at 9:30 position: Benign on biopsy   05/12/2021 Cancer Staging   Staging form: Breast, AJCC 8th Edition - Clinical stage from 05/12/2021: Stage IIIB (cT3, cN1, cM0, G2, ER-, PR-, HER2-) - Signed by Nicholas Lose, MD on 05/12/2021 Stage prefix: Initial diagnosis Histologic grading system: 3 grade system    05/30/2021 - 11/27/2021 Chemotherapy   Patient is on Treatment Plan : BREAST Pembrolizumab + AC q21d x 4 cycles followed by Pembrolizumab + Carboplatin D1 + Paclitaxel D1,8,15 q21d X 4 cycles        Surgery   Bilateral mastectomies: No residual cancer 0/11 LN Neg   01/21/2022 -  Chemotherapy   Patient is on Treatment Plan : BREAST Pembrolizumab (200) q21d x 27 weeks        CURRENT THERAPY: Pembrolizumab  INTERVAL HISTORY: Sheena Mcdaniel 51 y.o. female returns for follow-up prior to receiving pembrolizumab.  Today is her final cycle and she is elated to be completing therapy.  She is traveling to [place].  She is also going to receive her second dose of Venofer today.  She tolerated the last  dose without any difficulty.  Overall she is feeling well.   Patient Active Problem List   Diagnosis Date Noted   Port-A-Cath in place 06/18/2021   Malignant neoplasm of upper-outer quadrant of left breast in female, estrogen receptor positive ([facility]) 05/12/2021   Breast pain, left 04/24/2021   Elevated blood pressure reading 04/24/2021   Perimenopause 04/24/2021    has No Known Allergies.  MEDICAL HISTORY: Past Medical History:  Diagnosis Date   Anxiety    Cancer ([facility]) 03/2021   left breast IDC    SURGICAL HISTORY: Past Surgical History:  Procedure Laterality Date   MODIFIED MASTECTOMY Left 01/01/2022   Procedure: LEFT MODIFIED RADICAL MASTECTOMY;  Surgeon: Jovita Kussmaul, MD;  Location: [facility];  Service: General;  Laterality: Left;   PORTACATH PLACEMENT Mcdaniel/A 05/22/2021   Procedure: INSERTION PORT-A-CATH;  Surgeon: Jovita Kussmaul, MD;  Location: [facility];  Service: General;  Laterality: [address]/A;   TOTAL MASTECTOMY Right 01/01/2022   Procedure: RIGHT TOTAL MASTECTOMY;  Surgeon: Jovita Kussmaul, MD;  Location: [facility];  Service: General;  Laterality: Right;   WISDOM TOOTH EXTRACTION      SOCIAL HISTORY: Social History   Socioeconomic History   Marital status: Married    Spouse name: Not on file   Number of children: Not on file   Years of education: Not on file   Highest education level: Not on file  Occupational History   Not on file  Tobacco Use   Smoking status: Former  Smokeless tobacco: Never  Vaping Use   Vaping Use: Never used  Substance and Sexual Activity   Alcohol use: Yes    Comment: social   Drug use: Never   Sexual activity: Yes    Birth control/protection: None  Other Topics Concern   Not on file  Social History Narrative   Not on file   Social Determinants of Health   Financial Resource Strain: Not on file  Food Insecurity: Not on file  Transportation Needs: Not on file  Physical Activity: Not on file  Stress: Not on file  Social  Connections: Not on file  Intimate Partner Violence: Not on file    FAMILY HISTORY: History reviewed. No pertinent family history.  Review of Systems  Constitutional:  Negative for appetite change, chills, fatigue, fever and unexpected weight change.  HENT:   Negative for hearing loss, lump/mass and trouble swallowing.   Eyes:  Negative for eye problems and icterus.  Respiratory:  Negative for chest tightness, cough and shortness of breath.   Cardiovascular:  Negative for chest pain, leg swelling and palpitations.  Gastrointestinal:  Negative for abdominal distention, abdominal pain, constipation, diarrhea, nausea and vomiting.  Endocrine: Negative for hot flashes.  Genitourinary:  Negative for difficulty urinating.   Musculoskeletal:  Negative for arthralgias.  Skin:  Negative for itching and rash.  Neurological:  Negative for dizziness, extremity weakness, headaches and numbness.  Hematological:  Negative for adenopathy. Does not bruise/bleed easily.  Psychiatric/Behavioral:  Negative for depression. The patient is not nervous/anxious.      PHYSICAL EXAMINATION  ECOG PERFORMANCE STATUS: 1 - Symptomatic but completely ambulatory  Vitals:   05/28/22 1011  BP: 131/68  Pulse: 79  Resp: 16  Temp: 97.9 F (36.6 C)  SpO2: 100%    Physical Exam Constitutional:      Appearance: Normal appearance. She is not toxic-appearing.  HENT:     Head: Normocephalic and atraumatic.  Cardiovascular:     Rate and Rhythm: Normal rate and regular rhythm.  Pulmonary:     Effort: Pulmonary effort is normal.     Breath sounds: Normal breath sounds.  Skin:    General: Skin is warm and dry.     Capillary Refill: Capillary refill takes less than 2 seconds.     Findings: No rash.  Neurological:     General: No focal deficit present.    LABORATORY DATA:  CBC    Component Value Date/Time   WBC 14.7 (H) 05/28/2022 0959   RBC 3.88 05/28/2022 0959   HGB 10.5 (L) 05/28/2022 0959   HGB 9.6  (L) 03/26/2022 1133   HCT 32.6 (L) 05/28/2022 0959   PLT 301 05/28/2022 0959   PLT 250 03/26/2022 1133   MCV 84.0 05/28/2022 0959   MCH 27.1 05/28/2022 0959   MCHC 32.2 05/28/2022 0959   RDW 20.3 (H) 05/28/2022 0959   LYMPHSABS 1.0 05/28/2022 0959   MONOABS 0.6 05/28/2022 0959   EOSABS 10.5 (H) 05/28/2022 0959   BASOSABS 0.1 05/28/2022 0959    CMP     Component Value Date/Time   NA 140 05/07/2022 0913   K 3.9 05/07/2022 0913   CL 107 05/07/2022 0913   CO2 29 05/07/2022 0913   GLUCOSE 112 (H) 05/07/2022 0913   BUN 12 05/07/2022 0913   CREATININE 0.73 05/07/2022 0913   CREATININE 0.67 03/26/2022 1133   CALCIUM 8.9 05/07/2022 0913   PROT 5.5 (L) 05/07/2022 0913   ALBUMIN 3.3 (L) 05/07/2022 0913   AST 25  05/07/2022 0913   AST 17 03/26/2022 1133   ALT 18 05/07/2022 0913   ALT 12 03/26/2022 1133   ALKPHOS 64 05/07/2022 0913   BILITOT 0.5 05/07/2022 0913   BILITOT 0.4 03/26/2022 1133   GFRNONAA >60 05/07/2022 0913   GFRNONAA >60 03/26/2022 1133      ASSESSMENT and THERAPY PLAN:   Malignant neoplasm of upper-outer quadrant of left breast in female, estrogen receptor positive ([facility]) 05/06/2021: Large circumscribed multicystic mass left breast 9 to 10 cm with multiple left axillary lymph nodes with cortical thickening: biopsy 3 o'clock position: Grade 2 IDC, 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  Neoadjuvant chemotherapy with Adriamycin and Cytoxan along with pembrolizumab followed by Taxol carboplatin and pembrolizumab completed 11/27/2021 (pembrolizumab maintenance for 1 year) 2. 01/01/2022 bilateral Mastectomies: No residual cancer, 0/11 LN neg 3.  Adjuvant radiation 02/23/2022-04/07/2022 4.  Followed by adjuvant antiestrogen therapy (since she is 10% ER  positive) ------------------------------------------------------------------------------------------------------------------------------------------------------ Current Treatment; pembrolizumab maintenance therapy Aarna will complete pembrolizumab treatment today.  She will return in 4 weeks to discuss her survivorship care plan and also whether to proceed with antiestrogen therapy or not.  Kirsty is doing well today she has no clinical signs of breast cancer recurrence.  I will send a message to Dr. Marlou Starks about getting her port removed.   All questions were answered. The patient knows to call the clinic with any problems, questions or concerns. We can certainly see the patient much sooner if necessary.  Total encounter time:20 minutes*in face-to-face visit time, chart review, lab review, care coordination, order entry, and documentation of the encounter time.  Wilber Bihari, NP 05/28/22 10:46 AM Medical Oncology and Hematology [facility] [place], [place] [address] Tel. [phone]    Fax. [phone]  *Total Encounter Time as defined by the Centers for Medicare and Medicaid Services includes, in addition to the face-to-face time of a patient visit (documented in the note above) non-face-to-face time: obtaining and reviewing outside history, ordering and reviewing medications, tests or procedures, care coordination (communications with other health care professionals or caregivers) and documentation in the medical record.

## 2022-05-29 LAB — T4: T4, Total: 9.3 ug/dL (ref 4.5–12.0)

## 2022-05-31 ENCOUNTER — Telehealth: Payer: Self-pay | Admitting: Adult Health

## 2022-05-31 NOTE — Telephone Encounter (Signed)
Scheduled appointment per 6/2 los. Left message.

## 2022-07-08 ENCOUNTER — Other Ambulatory Visit: Payer: Self-pay

## 2022-07-08 ENCOUNTER — Inpatient Hospital Stay: Payer: Commercial Managed Care - HMO | Attending: Hematology and Oncology | Admitting: Adult Health

## 2022-07-08 ENCOUNTER — Encounter: Payer: Self-pay | Admitting: Adult Health

## 2022-07-08 VITALS — BP 126/90 | HR 92 | Temp 97.4°F | Resp 18 | Ht 64.0 in | Wt 138.8 lb

## 2022-07-08 DIAGNOSIS — C50412 Malignant neoplasm of upper-outer quadrant of left female breast: Secondary | ICD-10-CM | POA: Diagnosis not present

## 2022-07-08 DIAGNOSIS — Z170 Estrogen receptor positive status [ER+]: Secondary | ICD-10-CM | POA: Diagnosis not present

## 2022-07-08 DIAGNOSIS — Z87891 Personal history of nicotine dependence: Secondary | ICD-10-CM | POA: Insufficient documentation

## 2022-07-08 NOTE — Progress Notes (Signed)
SURVIVORSHIP VISIT:   BRIEF ONCOLOGIC HISTORY:  Oncology History  Malignant neoplasm of upper-outer quadrant of left breast in female, estrogen receptor positive ([place])  05/06/2021 Initial Diagnosis   Large circumscribed multicystic mass left breast 9 to 10 cm with multiple left axillary lymph nodes with cortical thickening: biopsy 3 o'clock position: Grade 2 IDC, ER 10%, PR 0%, Ki-67 60%, HER2 negative Right breast: 0.4 cm mass at 9:30 position: Benign on biopsy   05/12/2021 Cancer Staging   Staging form: Breast, AJCC 8th Edition - Clinical stage from 05/12/2021: Stage IIIB (cT3, cN1, cM0, G2, ER-, PR-, HER2-) - Signed by Nicholas Lose, MD on 05/12/2021 Stage prefix: Initial diagnosis Histologic grading system: 3 grade system   05/30/2021 - 11/27/2021 Chemotherapy   Patient is on Treatment Plan : BREAST Pembrolizumab + AC q21d x 4 cycles followed by Pembrolizumab + Carboplatin D1 + Paclitaxel D1,8,15 q21d X 4 cycles       Surgery   Bilateral mastectomies: No residual cancer 0/11 LN Neg   01/21/2022 -  Chemotherapy   Patient is on Treatment Plan : BREAST Pembrolizumab (200) q21d x 27 weeks     02/22/2022 - 04/07/2022 Radiation Therapy   Site Technique Total Dose (Gy) Dose per Fx (Gy) Completed Fx Beam Energies  Chest Wall, Left: CW_L 3D 50.4/50.4 1.8 28/28 6X  Chest Wall, Left: CW_L_SCLV 3D 50.4/50.4 1.8 28/28 6X, 10X  Chest Wall, Left: CW_L_Bst Electron 10/10 2 5/5 6E       INTERVAL HISTORY:  Ms. Gillham to review her survivorship care plan detailing her treatment course for breast cancer, as well as monitoring long-term side effects of that treatment, education regarding health maintenance, screening, and overall wellness and health promotion.     Overall, Ms. Long reports feeling quite well today.  She reviewed with me that she does not want to hear a lot about cancer staging or percentage of recurrence risk.  She also wonders if taking Tamoxifen would be beneficial with the fact  that her breast cancer was estrogen positive.   REVIEW OF SYSTEMS:  Review of Systems  Constitutional:  Negative for appetite change, chills, fatigue, fever and unexpected weight change.  HENT:   Negative for hearing loss, lump/mass and trouble swallowing.   Eyes:  Negative for eye problems and icterus.  Respiratory:  Negative for chest tightness, cough and shortness of breath.   Cardiovascular:  Negative for chest pain, leg swelling and palpitations.  Gastrointestinal:  Negative for abdominal distention, abdominal pain, constipation, diarrhea, nausea and vomiting.  Endocrine: Negative for hot flashes.  Genitourinary:  Negative for difficulty urinating.   Musculoskeletal:  Negative for arthralgias.  Skin:  Negative for itching and rash.  Neurological:  Negative for dizziness, extremity weakness, headaches and numbness.  Hematological:  Negative for adenopathy. Does not bruise/bleed easily.  Psychiatric/Behavioral:  Negative for depression. The patient is not nervous/anxious.   Breast: Denies any new nodularity, masses, tenderness, nipple changes, or nipple discharge.    ONCOLOGY TREATMENT TEAM:  1. Surgeon:  Dr. Marlou Starks at [facility] 2. Medical Oncologist: Dr. Lindi Adie  3. Radiation Oncologist: Dr. Lisbeth Renshaw    PAST MEDICAL/SURGICAL HISTORY:  Past Medical History:  Diagnosis Date   Anxiety    Cancer ([place]) 03/2021   left breast IDC   Past Surgical History:  Procedure Laterality Date   MODIFIED MASTECTOMY Left 01/01/2022   Procedure: LEFT MODIFIED RADICAL MASTECTOMY;  Surgeon: Jovita Kussmaul, MD;  Location: [place];  Service: General;  Laterality: Left;  PORTACATH PLACEMENT N/A 05/22/2021   Procedure: INSERTION PORT-A-CATH;  Surgeon: Jovita Kussmaul, MD;  Location: [place];  Service: General;  Laterality: N/A;   TOTAL MASTECTOMY Right 01/01/2022   Procedure: RIGHT TOTAL MASTECTOMY;  Surgeon: Jovita Kussmaul, MD;  Location: [place];  Service: General;  Laterality:  Right;   WISDOM TOOTH EXTRACTION       ALLERGIES:  No Known Allergies   CURRENT MEDICATIONS:  Outpatient Encounter Medications as of 07/08/2022  Medication Sig   lidocaine-prilocaine (EMLA) cream Apply topically.   No facility-administered encounter medications on file as of 07/08/2022.     ONCOLOGIC FAMILY HISTORY:  No family history on file.   GENETIC COUNSELING/TESTING: Discussed again and she is reconsidering  SOCIAL HISTORY:  Social History   Socioeconomic History   Marital status: Married    Spouse name: Not on file   Number of children: Not on file   Years of education: Not on file   Highest education level: Not on file  Occupational History   Not on file  Tobacco Use   Smoking status: Former   Smokeless tobacco: Never  Vaping Use   Vaping Use: Never used  Substance and Sexual Activity   Alcohol use: Yes    Comment: social   Drug use: Never   Sexual activity: Yes    Birth control/protection: None  Other Topics Concern   Not on file  Social History Narrative   Not on file   Social Determinants of Health   Financial Resource Strain: Not on file  Food Insecurity: Not on file  Transportation Needs: Not on file  Physical Activity: Not on file  Stress: Not on file  Social Connections: Not on file  Intimate Partner Violence: Not on file     OBSERVATIONS/OBJECTIVE:  BP 126/90 (BP Location: Right Arm, Patient Position: Sitting)   Pulse 92   Temp (!) 97.4 F (36.3 C) (Rectal)   Resp 18   Ht 5' 4"  (1.626 m)   Wt 138 lb 12.8 oz (63 kg)   SpO2 100%   BMI 23.82 kg/m  GENERAL: Patient is a well appearing female in no acute distress HEENT:  Sclerae anicteric.  Oropharynx clear and moist. No ulcerations or evidence of oropharyngeal candidiasis. Neck is supple.  NODES:  No cervical, supraclavicular, or axillary lymphadenopathy palpated.  BREAST EXAM:  s/p bilateral mastectomies no sign of local recurrence LUNGS:  Clear to auscultation bilaterally.  No  wheezes or rhonchi. HEART:  Regular rate and rhythm. No murmur appreciated. ABDOMEN:  Soft, nontender.  Positive, normoactive bowel sounds. No organomegaly palpated. MSK:  No focal spinal tenderness to palpation. Full range of motion bilaterally in the upper extremities. EXTREMITIES:  No peripheral edema.   SKIN:  Clear with no obvious rashes or skin changes. No nail dyscrasia. NEURO:  Nonfocal. Well oriented.  Appropriate affect.  LABORATORY DATA:  None for this visit.  DIAGNOSTIC IMAGING:  None for this visit.      ASSESSMENT AND PLAN:  Ms.. Cressy is a pleasant 51 y.o. 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  She has not yet began antiestrogen therapy.  She presents to the Survivorship Clinic for our initial meeting and routine follow-up post-completion of treatment for breast cancer.    1. Stage IIIB left breast cancer:  Ms. Pethtel is continuing to recover from definitive treatment for breast cancer. She will follow-up with her  medical oncologist, Dr. Lindi Adie in 3 months with history and physical exam per surveillance protocol. We discussed risks and benefits associated with antiestrogen therapy.  She is going ot continue to consider this and will let us know if she would like to proceed with it.  We also discussed her stage of testing and signatera testing which she briefly discussed with Dr. Lindi Adie and she agreed.   Today, a comprehensive survivorship care plan and treatment summary was reviewed with the patient today detailing her breast cancer diagnosis, treatment course, potential late/long-term effects of treatment, appropriate follow-up care with recommendations for the future, and patient education resources.  A copy of this summary, along with a letter will be sent to the patient's primary care provider via mail/fax/In Basket  message after today's visit.    2. Bone health:   She was given education on specific activities to promote bone health.  3. Cancer screening:  Due to Ms. Folz's history and her age, she should receive screening for skin cancers, colon cancer, and gynecologic cancers.  The information and recommendations are listed on the patient's comprehensive care plan/treatment summary and were reviewed in detail with the patient.    4. Health maintenance and wellness promotion: Ms. Manocchio was encouraged to consume 5-7 servings of fruits and vegetables per day. We reviewed the "Nutrition Rainbow" handout.  She was also encouraged to engage in moderate to vigorous exercise for 30 minutes per day most days of the week. We discussed the [employer] fitness program, which is designed for cancer survivors to help them become more physically fit after cancer treatments.  She was instructed to limit her alcohol consumption and continue to abstain from tobacco use.     5. Support services/counseling: It is not uncommon for this period of the patient's cancer care trajectory to be one of many emotions and stressors.  She was given information regarding our available services and encouraged to contact me with any questions or for help enrolling in any of our support group/programs.    Follow up instructions:    -Return to cancer center in 3 months for f/u with Dr. Lindi Adie  -She is welcome to return back to the Survivorship Clinic at any time; no additional follow-up needed at this time.  -Consider referral back to survivorship as a long-term survivor for continued surveillance  The patient was provided an opportunity to ask questions and all were answered. The patient agreed with the plan and demonstrated an understanding of the instructions.   Total encounter time:45 minutes*in face-to-face visit time, chart review, lab review, care coordination, order entry, and documentation of the encounter time.  Wilber Bihari, NP 07/08/22 11:24 AM Medical Oncology and Hematology [facility] [place], [place] [address] Tel. [phone]    Fax. [phone]  *Total Encounter Time as defined by the Centers for Medicare and Medicaid Services includes, in addition to the face-to-face time of a patient visit (documented in the note above) non-face-to-face time: obtaining and reviewing outside history, ordering and reviewing medications, tests or procedures, care coordination (communications with other health care professionals or caregivers) and documentation in the medical record.

## 2022-07-09 ENCOUNTER — Telehealth: Payer: Self-pay | Admitting: Adult Health

## 2022-07-09 NOTE — Telephone Encounter (Signed)
Scheduled appointment per 7/13 los. Left voicemail.

## 2022-07-13 ENCOUNTER — Encounter: Payer: Self-pay | Admitting: *Deleted

## 2022-07-13 NOTE — Progress Notes (Signed)
Per MD request RN successfully faxed Signatera testing request [phone]).

## 2022-07-15 ENCOUNTER — Encounter (HOSPITAL_BASED_OUTPATIENT_CLINIC_OR_DEPARTMENT_OTHER): Payer: Self-pay | Admitting: General Surgery

## 2022-07-15 ENCOUNTER — Other Ambulatory Visit: Payer: Self-pay

## 2022-07-22 ENCOUNTER — Ambulatory Visit (HOSPITAL_BASED_OUTPATIENT_CLINIC_OR_DEPARTMENT_OTHER): Payer: Commercial Managed Care - HMO | Admitting: Anesthesiology

## 2022-07-22 ENCOUNTER — Encounter (HOSPITAL_BASED_OUTPATIENT_CLINIC_OR_DEPARTMENT_OTHER): Payer: Self-pay | Admitting: General Surgery

## 2022-07-22 ENCOUNTER — Other Ambulatory Visit: Payer: Self-pay

## 2022-07-22 ENCOUNTER — Encounter (HOSPITAL_BASED_OUTPATIENT_CLINIC_OR_DEPARTMENT_OTHER)
Admission: RE | Disposition: A | Discharge status: Home / Self Care | Payer: Self-pay | Source: Home / Self Care | Attending: General Surgery

## 2022-07-22 ENCOUNTER — Ambulatory Visit (HOSPITAL_BASED_OUTPATIENT_CLINIC_OR_DEPARTMENT_OTHER)
Admission: RE | Admit: 2022-07-22 | Discharge: 2022-07-22 | Disposition: A | Discharge status: Home / Self Care | Payer: Commercial Managed Care - HMO | Attending: General Surgery | Admitting: General Surgery

## 2022-07-22 DIAGNOSIS — Z452 Encounter for adjustment and management of vascular access device: Secondary | ICD-10-CM

## 2022-07-22 DIAGNOSIS — C50912 Malignant neoplasm of unspecified site of left female breast: Secondary | ICD-10-CM

## 2022-07-22 DIAGNOSIS — Z79899 Other long term (current) drug therapy: Secondary | ICD-10-CM | POA: Insufficient documentation

## 2022-07-22 DIAGNOSIS — F419 Anxiety disorder, unspecified: Secondary | ICD-10-CM | POA: Diagnosis not present

## 2022-07-22 DIAGNOSIS — Z9013 Acquired absence of bilateral breasts and nipples: Secondary | ICD-10-CM | POA: Diagnosis not present

## 2022-07-22 DIAGNOSIS — Z171 Estrogen receptor negative status [ER-]: Secondary | ICD-10-CM | POA: Insufficient documentation

## 2022-07-22 DIAGNOSIS — C50412 Malignant neoplasm of upper-outer quadrant of left female breast: Secondary | ICD-10-CM | POA: Diagnosis not present

## 2022-07-22 DIAGNOSIS — Z87891 Personal history of nicotine dependence: Secondary | ICD-10-CM | POA: Diagnosis not present

## 2022-07-22 HISTORY — PX: PORT-A-CATH REMOVAL: SHX5289

## 2022-07-22 SURGERY — REMOVAL PORT-A-CATH
Anesthesia: Monitor Anesthesia Care | Site: Chest | Laterality: Right

## 2022-07-22 MED ORDER — FENTANYL CITRATE (PF) 100 MCG/2ML IJ SOLN
INTRAMUSCULAR | Status: AC
  Filled 2022-07-22: qty 2

## 2022-07-22 MED ORDER — LIDOCAINE HCL (CARDIAC) PF 100 MG/5ML IV SOSY
PREFILLED_SYRINGE | INTRAVENOUS | Status: DC | PRN
  Administered 2022-07-22: 40 mg via INTRAVENOUS

## 2022-07-22 MED ORDER — PROPOFOL 10 MG/ML IV BOLUS
INTRAVENOUS | Status: DC | PRN
  Administered 2022-07-22: 20 mg via INTRAVENOUS
  Administered 2022-07-22: 40 mg via INTRAVENOUS

## 2022-07-22 MED ORDER — AMISULPRIDE (ANTIEMETIC) 5 MG/2ML IV SOLN: 10.0000 mg | Freq: Once | INTRAVENOUS | Status: DC | PRN

## 2022-07-22 MED ORDER — OXYCODONE HCL 5 MG/5ML PO SOLN: 5.0000 mg | Freq: Once | ORAL | Status: DC | PRN

## 2022-07-22 MED ORDER — LIDOCAINE 2% (20 MG/ML) 5 ML SYRINGE
INTRAMUSCULAR | Status: AC
  Filled 2022-07-22: qty 5

## 2022-07-22 MED ORDER — ONDANSETRON HCL 4 MG/2ML IJ SOLN
INTRAMUSCULAR | Status: DC | PRN
  Administered 2022-07-22: 4 mg via INTRAVENOUS

## 2022-07-22 MED ORDER — MIDAZOLAM HCL 5 MG/5ML IJ SOLN
INTRAMUSCULAR | Status: DC | PRN
  Administered 2022-07-22: 2 mg via INTRAVENOUS

## 2022-07-22 MED ORDER — OXYCODONE HCL 5 MG PO TABS: 5.0000 mg | ORAL_TABLET | Freq: Four times a day (QID) | ORAL | 0 refills | Status: DC | PRN

## 2022-07-22 MED ORDER — LACTATED RINGERS IV SOLN: INTRAVENOUS | Status: DC

## 2022-07-22 MED ORDER — ONDANSETRON HCL 4 MG/2ML IJ SOLN
INTRAMUSCULAR | Status: AC
  Filled 2022-07-22: qty 2

## 2022-07-22 MED ORDER — BUPIVACAINE-EPINEPHRINE 0.25% -1:200000 IJ SOLN
INTRAMUSCULAR | Status: DC | PRN
  Administered 2022-07-22: 10 mL

## 2022-07-22 MED ORDER — ACETAMINOPHEN 500 MG PO TABS
1000.0000 mg | ORAL_TABLET | Freq: Once | ORAL | Status: AC
Start: 2022-07-22 — End: 2022-07-22
  Administered 2022-07-22: 1000 mg via ORAL

## 2022-07-22 MED ORDER — ACETAMINOPHEN 500 MG PO TABS
ORAL_TABLET | ORAL | Status: AC
  Filled 2022-07-22: qty 2

## 2022-07-22 MED ORDER — KETOROLAC TROMETHAMINE 30 MG/ML IJ SOLN: 30.0000 mg | Freq: Once | INTRAMUSCULAR | Status: DC | PRN

## 2022-07-22 MED ORDER — ONDANSETRON HCL 4 MG/2ML IJ SOLN: 4.0000 mg | Freq: Once | INTRAMUSCULAR | Status: DC | PRN

## 2022-07-22 MED ORDER — PROPOFOL 500 MG/50ML IV EMUL
INTRAVENOUS | Status: DC | PRN
  Administered 2022-07-22: 100 ug/kg/min via INTRAVENOUS

## 2022-07-22 MED ORDER — FENTANYL CITRATE (PF) 100 MCG/2ML IJ SOLN
INTRAMUSCULAR | Status: DC | PRN
  Administered 2022-07-22: 50 ug via INTRAVENOUS

## 2022-07-22 MED ORDER — OXYCODONE HCL 5 MG PO TABS: 5.0000 mg | ORAL_TABLET | Freq: Once | ORAL | Status: DC | PRN

## 2022-07-22 MED ORDER — HYDROMORPHONE HCL 1 MG/ML IJ SOLN: 0.2500 mg | INTRAMUSCULAR | Status: DC | PRN

## 2022-07-22 MED ORDER — MIDAZOLAM HCL 2 MG/2ML IJ SOLN
INTRAMUSCULAR | Status: AC
  Filled 2022-07-22: qty 2

## 2022-07-22 MED ORDER — PROPOFOL 500 MG/50ML IV EMUL
INTRAVENOUS | Status: AC
  Filled 2022-07-22: qty 50

## 2022-07-22 SURGICAL SUPPLY — 33 items
ADH SKN CLS APL DERMABOND .7 (GAUZE/BANDAGES/DRESSINGS) ×1
APL PRP STRL LF DISP 70% ISPRP (MISCELLANEOUS) ×2
BLADE SURG 15 STRL LF DISP TIS (BLADE) ×1 IMPLANT
BLADE SURG 15 STRL SS (BLADE) ×2
CHLORAPREP W/TINT 26 (MISCELLANEOUS) ×3 IMPLANT
COVER BACK TABLE 60X90IN (DRAPES) ×2 IMPLANT
COVER MAYO STAND STRL (DRAPES) ×2 IMPLANT
DERMABOND ADVANCED (GAUZE/BANDAGES/DRESSINGS) ×1
DERMABOND ADVANCED .7 DNX12 (GAUZE/BANDAGES/DRESSINGS) ×1 IMPLANT
DRAPE LAPAROTOMY 100X72 PEDS (DRAPES) ×2 IMPLANT
DRAPE UTILITY XL STRL (DRAPES) ×2 IMPLANT
ELECT COATED BLADE 2.86 ST (ELECTRODE) IMPLANT
ELECT REM PT RETURN 9FT ADLT (ELECTROSURGICAL)
ELECTRODE REM PT RTRN 9FT ADLT (ELECTROSURGICAL) IMPLANT
GLOVE BIO SURGEON STRL SZ7.5 (GLOVE) ×2 IMPLANT
GLOVE BIOGEL PI IND STRL 7.0 (GLOVE) IMPLANT
GLOVE BIOGEL PI IND STRL 7.5 (GLOVE) IMPLANT
GLOVE BIOGEL PI INDICATOR 7.0 (GLOVE) ×1
GLOVE BIOGEL PI INDICATOR 7.5 (GLOVE) ×1
GLOVE SURG SS PI 7.5 STRL IVOR (GLOVE) ×1 IMPLANT
GOWN STRL REUS W/ TWL LRG LVL3 (GOWN DISPOSABLE) ×2 IMPLANT
GOWN STRL REUS W/TWL LRG LVL3 (GOWN DISPOSABLE) ×4
NDL HYPO 25X1 1.5 SAFETY (NEEDLE) ×1 IMPLANT
NEEDLE HYPO 25X1 1.5 SAFETY (NEEDLE) ×2 IMPLANT
PACK BASIN DAY SURGERY FS (CUSTOM PROCEDURE TRAY) ×2 IMPLANT
PENCIL SMOKE EVACUATOR (MISCELLANEOUS) IMPLANT
SLEEVE SCD COMPRESS KNEE MED (STOCKING) IMPLANT
SPIKE FLUID TRANSFER (MISCELLANEOUS) ×1 IMPLANT
SUT MON AB 4-0 PC3 18 (SUTURE) ×2 IMPLANT
SUT VIC AB 3-0 SH 27 (SUTURE) ×2
SUT VIC AB 3-0 SH 27X BRD (SUTURE) ×1 IMPLANT
SYR CONTROL 10ML LL (SYRINGE) ×2 IMPLANT
TOWEL GREEN STERILE FF (TOWEL DISPOSABLE) ×2 IMPLANT

## 2022-07-22 NOTE — Op Note (Signed)
07/22/2022  12:49 PM  PATIENT:  Sheena Mcdaniel  51 y.o. female  PRE-OPERATIVE DIAGNOSIS:  LEFT BREAST CANCER  POST-OPERATIVE DIAGNOSIS:  LEFT BREAST CANCER  PROCEDURE:  Procedure(s): REMOVAL PORT-A-CATH (Right)  SURGEON:  Surgeon(s) and Role:    * Jovita Kussmaul, MD - Primary  PHYSICIAN ASSISTANT:   ASSISTANTS: none   ANESTHESIA:   local and IV sedation  EBL:  5 mL   BLOOD ADMINISTERED:none  DRAINS: none   LOCAL MEDICATIONS USED:  MARCAINE     SPECIMEN:  No Specimen  DISPOSITION OF SPECIMEN:  N/A  COUNTS:  YES  TOURNIQUET:  * No tourniquets in log *  DICTATION: .Dragon Dictation  After informed consent was obtained the patient was brought to the operating room and placed in the supine position on the operating table.  After adequate IV sedation had been given the patient's right chest and neck were prepped with ChloraPrep, allowed to dry, and draped in usual sterile manner.  The area around the reservoir of the port was infiltrated with quarter percent Marcaine until a good field block was created.  A small incision was then made through her previous incision with a 15 blade knife.  The incision was carried through the subcutaneous tissue sharply with the 15 blade knife until the capsule surrounding the port was opened.  The 2 anchoring stitches holding the port in place were divided and removed.  The port was then gently pushed out of its pocket and with gentle traction was removed from the patient without difficulty.  Pressure was held on the chest and neck for several minutes until the area was completely hemostatic.  The tubing tract was closed with a figure-of-eight 3-0 Vicryl stitch.  The subcutaneous tissue was closed with interrupted 3-0 Vicryl stitches.  The skin was then closed with interrupted 4-0 Monocryl subcuticular stitches.  Dermabond dressings were applied.  The patient tolerated the procedure well.  At the end of the case all needle sponge and instrument  counts were correct.  The patient was then awakened and taken to recovery in stable condition.  PLAN OF CARE: Discharge to home after PACU  PATIENT DISPOSITION:  PACU - hemodynamically stable.   Delay start of Pharmacological VTE agent (>24hrs) due to surgical blood loss or risk of bleeding: not applicable

## 2022-07-22 NOTE — Anesthesia Postprocedure Evaluation (Signed)
Anesthesia Post Note  Patient: Sheena Mcdaniel  Procedure(s) Performed: REMOVAL PORT-A-CATH (Right: Chest)     Patient location during evaluation: PACU Anesthesia Type: MAC Level of consciousness: awake and alert Pain management: pain level controlled Vital Signs Assessment: post-procedure vital signs reviewed and stable Respiratory status: spontaneous breathing, nonlabored ventilation and respiratory function stable Cardiovascular status: blood pressure returned to baseline and stable Postop Assessment: no apparent nausea or vomiting Anesthetic complications: no   No notable events documented.  Last Vitals:  Vitals:   07/22/22 1300 07/22/22 1311  BP: (!) 96/59 (!) 139/56  Pulse: 73 75  Resp: 16 15  Temp: 36.6 C 36.7 C  SpO2: 98% 100%    Last Pain:  Vitals:   07/22/22 1311  TempSrc:   PainSc: 0-No pain                 Pervis Hocking

## 2022-07-22 NOTE — Anesthesia Preprocedure Evaluation (Addendum)
Anesthesia Evaluation  Patient identified by MRN, date of birth, ID band Patient awake    Reviewed: Allergy & Precautions, NPO status , Patient's Chart, lab work & pertinent test results  History of Anesthesia Complications (+) PONV and history of anesthetic complications (some vomiting with last surgery )  Airway Mallampati: I  TM Distance: >3 FB Neck ROM: Full    Dental  (+) Teeth Intact, Dental Advisory Given TMJ :   Pulmonary former smoker,    Pulmonary exam normal breath sounds clear to auscultation       Cardiovascular negative cardio ROS Normal cardiovascular exam Rhythm:Regular Rate:Normal     Neuro/Psych PSYCHIATRIC DISORDERS Anxiety negative neurological ROS     GI/Hepatic negative GI ROS, Neg liver ROS,   Endo/Other  negative endocrine ROS  Renal/GU negative Renal ROS  negative genitourinary   Musculoskeletal negative musculoskeletal ROS (+)   Abdominal   Peds  Hematology negative hematology ROS (+)   Anesthesia Other Findings   Reproductive/Obstetrics negative OB ROS                            Anesthesia Physical Anesthesia Plan  ASA: 1  Anesthesia Plan: MAC   Post-op Pain Management: Tylenol PO (pre-op)* and Toradol IV (intra-op)*   Induction:   PONV Risk Score and Plan: 2 and Propofol infusion, TIVA, Midazolam and Treatment may vary due to age or medical condition  Airway Management Planned: Natural Airway and Simple Face Mask  Additional Equipment: None  Intra-op Plan:   Post-operative Plan: Extubation in OR  Informed Consent: I have reviewed the patients History and Physical, chart, labs and discussed the procedure including the risks, benefits and alternatives for the proposed anesthesia with the patient or authorized representative who has indicated his/her understanding and acceptance.     Dental advisory given  Plan Discussed with:  CRNA  Anesthesia Plan Comments:        Anesthesia Quick Evaluation

## 2022-07-22 NOTE — H&P (Signed)
  PROVIDER: Landry Corporal, MD  MRN: [id number] DOB: 08/20/1971 Subjective   Chief Complaint: Follow-up   History of Present Illness: Sheena Mcdaniel is a 51 y.o. female who is seen today for left breast cancer. The patient is a 51 year old white female who is about 2 months status post left modified radical mastectomy for a [id number] triple negative left breast cancer with a Ki-67 of 60%. She also had a right prophylactic mastectomy. She tolerated the surgery well. After the drains were removed she developed some cellulitis in the left axilla. This has resolved at this point  Review of Systems: A complete review of systems was obtained from the patient. I have reviewed this information and discussed as appropriate with the patient. See HPI as well for other ROS.  ROS   Medical History: Past Medical History:  Diagnosis Date   History of cancer   Patient Active Problem List  Diagnosis   Malignant neoplasm of upper-outer quadrant of left breast in female, estrogen receptor negative (CMS-HCC)   Past Surgical History:  Procedure Laterality Date   INSERTION CENTRAL VENOUS ACCESS DEVICE W/ SUBCUTANEOUS PORT N/A    No Known Allergies  No current outpatient medications on file prior to visit.   No current facility-administered medications on file prior to visit.   Family History  Problem Relation Age of Onset   Hyperlipidemia (Elevated cholesterol) Mother   High blood pressure (Hypertension) Mother   Skin cancer Mother   Hyperlipidemia (Elevated cholesterol) Father   High blood pressure (Hypertension) Father    Social History   Tobacco Use  Smoking Status Never  Smokeless Tobacco Never    Social History   Socioeconomic History   Marital status: Married  Tobacco Use   Smoking status: Never   Smokeless tobacco: Never  Substance and Sexual Activity   Alcohol use: Yes   Drug use: Never   Objective:   There were no vitals filed for this visit.  There is no height  or weight on file to calculate BMI.  Physical Exam   Breast: Both mastectomy incisions are healing. The cellulitis of the left chest wall has completely resolved. The open area of the mastectomy incision has healed. There is no underlying seroma fluid.  Labs, Imaging and Diagnostic Testing:  Assessment and Plan:   Diagnoses and all orders for this visit:  Malignant neoplasm of upper-outer quadrant of left breast in female, estrogen receptor negative (CMS-HCC)    The patient is about 2 months status post left modified radical mastectomy for breast cancer and right prophylactic mastectomy. She tolerated the surgery well. She will continue with her immunotherapy as well as radiation therapy. I will plan to see her back in about 6 months. She may be allowed to have her port removed in June. I have discussed with her in detail the risks and benefits of the operation as well as some of the technical aspects and she understands. She will contact me when she has been approved to have it removed

## 2022-07-22 NOTE — Discharge Instructions (Signed)
May take Tylenol after 5:30pm, if needed.    Post Anesthesia Home Care Instructions  Activity: Get plenty of rest for the remainder of the day. A responsible individual must stay with you for 24 hours following the procedure.  For the next 24 hours, DO NOT: -Drive a car -Paediatric nurse -Drink alcoholic beverages -Take any medication unless instructed by your physician -Make any legal decisions or sign important papers.  Meals: Start with liquid foods such as gelatin or soup. Progress to regular foods as tolerated. Avoid greasy, spicy, heavy foods. If nausea and/or vomiting occur, drink only clear liquids until the nausea and/or vomiting subsides. Call your physician if vomiting continues.  Special Instructions/Symptoms: Your throat may feel dry or sore from the anesthesia or the breathing tube placed in your throat during surgery. If this causes discomfort, gargle with warm salt water. The discomfort should disappear within 24 hours.  If you had a scopolamine patch placed behind your ear for the management of post- operative nausea and/or vomiting:  1. The medication in the patch is effective for 72 hours, after which it should be removed.  Wrap patch in a tissue and discard in the trash. Wash hands thoroughly with soap and water. 2. You may remove the patch earlier than 72 hours if you experience unpleasant side effects which may include dry mouth, dizziness or visual disturbances. 3. Avoid touching the patch. Wash your hands with soap and water after contact with the patch.

## 2022-07-22 NOTE — Transfer of Care (Signed)
Immediate Anesthesia Transfer of Care Note  Patient: Madie Cahn  Procedure(s) Performed: REMOVAL PORT-A-CATH (Right: Chest)  Patient Location: PACU  Anesthesia Type:MAC  Level of Consciousness: awake, alert  and oriented  Airway & Oxygen Therapy: Patient Spontanous Breathing  Post-op Assessment: Report given to RN and Post -op Vital signs reviewed and stable  Post vital signs: Reviewed and stable  Last Vitals:  Vitals Value Taken Time  BP    Temp    Pulse    Resp    SpO2      Last Pain:  Vitals:   07/22/22 1117  TempSrc: Oral  PainSc: 0-No pain         Complications: No notable events documented.

## 2022-07-22 NOTE — Interval H&P Note (Signed)
History and Physical Interval Note:  07/22/2022 12:08 PM  Sheena Mcdaniel  has presented today for surgery, with the diagnosis of LEFT BREAST CANCER.  The various methods of treatment have been discussed with the patient and family. After consideration of risks, benefits and other options for treatment, the patient has consented to  Procedure(s): REMOVAL PORT-A-CATH as a surgical intervention.  The patient's history has been reviewed, patient examined, no change in status, stable for surgery.  I have reviewed the patient's chart and labs.  Questions were answered to the patient's satisfaction.     [name]

## 2022-07-23 ENCOUNTER — Encounter (HOSPITAL_BASED_OUTPATIENT_CLINIC_OR_DEPARTMENT_OTHER): Payer: Self-pay | Admitting: General Surgery

## 2022-08-09 ENCOUNTER — Ambulatory Visit: Payer: Commercial Managed Care - HMO | Attending: General Surgery

## 2022-08-09 VITALS — Wt 136.1 lb

## 2022-08-09 DIAGNOSIS — Z483 Aftercare following surgery for neoplasm: Secondary | ICD-10-CM | POA: Insufficient documentation

## 2022-08-09 NOTE — Therapy (Addendum)
OUTPATIENT PHYSICAL THERAPY SOZO SCREENING NOTE   Patient Name: Sheena Mcdaniel MRN: [id number] DOB:May 19, 1971, 51 y.o., female Today's Date: 08/09/2022  PCP: Linward Natal, MD REFERRING PROVIDER: Jovita Kussmaul, MD   PT End of Session - 08/09/22 1520     Visit Number 8   # unchanged due to screen only   PT Start Time 7124    PT Stop Time 1530    PT Time Calculation (min) 12 min    Activity Tolerance Patient tolerated treatment well    Behavior During Therapy [facility] for tasks assessed/performed             Past Medical History:  Diagnosis Date   Anxiety    Cancer ([place]) 03/2021   left breast IDC   Past Surgical History:  Procedure Laterality Date   MODIFIED MASTECTOMY Left 01/01/2022   Procedure: LEFT MODIFIED RADICAL MASTECTOMY;  Surgeon: Jovita Kussmaul, MD;  Location: [place];  Service: General;  Laterality: Left;   PORT-A-CATH REMOVAL Right 07/22/2022   Procedure: REMOVAL PORT-A-CATH;  Surgeon: Jovita Kussmaul, MD;  Location: [place];  Service: General;  Laterality: Right;   PORTACATH PLACEMENT N/A 05/22/2021   Procedure: INSERTION PORT-A-CATH;  Surgeon: Jovita Kussmaul, MD;  Location: [place];  Service: General;  Laterality: N/A;   TOTAL MASTECTOMY Right 01/01/2022   Procedure: RIGHT TOTAL MASTECTOMY;  Surgeon: Jovita Kussmaul, MD;  Location: [place];  Service: General;  Laterality: Right;   [place] EXTRACTION     Patient Active Problem List   Diagnosis Date Noted   Port-A-Cath in place 06/18/2021   Malignant neoplasm of upper-outer quadrant of left breast in female, estrogen receptor positive ([place]) 05/12/2021   Breast pain, left 04/24/2021   Elevated blood pressure reading 04/24/2021   Perimenopause 04/24/2021    REFERRING DIAG: left breast cancer at risk for lymphedema  THERAPY DIAG:  Aftercare following surgery for neoplasm  PERTINENT HISTORY: Dx of left breast cancer with multiple abnormal lymph nodes back in June. The cancer  was a triple negative with a Ki-67 of 60%. She has undergone neoadjuvant chemotherapy and has had a good response. The cancer now measures 2.9 cm and the lymph nodes all looked normal. bil mastectomy 01/01/22 with 11 lymph nodes removed on left all negative. Drains out now. Radiation coming up on 02/22/22.  PRECAUTIONS: left UE Lymphedema risk, None  SUBJECTIVE: Pt returns for her 3 month L-Dex screen.   PAIN:  Are you having pain? No  SOZO SCREENING: Patient was assessed today using the SOZO machine to determine the lymphedema index score. This was compared to her baseline score. It was determined that she is NOT within the recommended range when compared to her baseline and so she was fitted for a compression garment while in the clinic today. It is recommended she return in 1 month to be reassessed. If she continues to measure outside the recommended range, physical therapy treatment will be recommended at that time and a referral requested.   L-DEX FLOWSHEETS - 08/09/22 1500       L-DEX LYMPHEDEMA SCREENING   Measurement Type Unilateral    L-DEX MEASUREMENT EXTREMITY Upper Extremity    POSITION  Standing    DOMINANT SIDE Right    At Risk Side Left    BASELINE SCORE (UNILATERAL) 3    L-DEX SCORE (UNILATERAL) 11.1    VALUE CHANGE (UNILAT) 8.1              Debbe Bales,  Valerie Ann, PTA 08/09/2022, 3:30 PM  PLEASE KEEP YOUR COMPRESSION GARMENT ON DURING THE DAY TO GET THE BEST SWELLING REDUCTION. HERE ARE SOME ADDITIONAL TIPS: Do not sleep in your garment. If you have pain or notice swelling in your hand or at the top of your shoulder, call your therapist. This may be a sign that you need a different garment. 3.  Take good care of your garment so it lasts longer: Follow washing instructions on your garment label or box. Wash periodically using a mild detergent in warm water.  Do not use fabric softener or bleach.  Place garment in a mesh lingerie bag and use the gentle cycle of  the washing machine or hand wash. Tumble dry low or lay flat to dry. TAKE CARE OF YOUR SKIN Apply a low pH moisturizing lotion to your skin daily Avoid scratching your skin Treat skin irritations quickly  Know the 5 warning signs of infection: redness, pain, warmth to touch, fever and increased swelling.  Call your physician immediately if you notice any of these signs of a possible infection.   

## 2022-08-09 NOTE — Patient Instructions (Signed)
PLEASE KEEP YOUR COMPRESSION GARMENT ON DURING THE DAY TO GET THE BEST SWELLING REDUCTION. HERE ARE SOME ADDITIONAL TIPS: Do not sleep in your garment. If you have pain or notice swelling in your hand or at the top of your shoulder, call your therapist. This may be a sign that you need a different garment. 3.  Take good care of your garment so it lasts longer: Follow washing instructions on your garment label or box. Wash periodically using a mild detergent in warm water.  Do not use fabric softener or bleach.  Place garment in a mesh lingerie bag and use the gentle cycle of the washing machine or hand wash. Tumble dry low or lay flat to dry. TAKE CARE OF YOUR SKIN Apply a low pH moisturizing lotion to your skin daily Avoid scratching your skin Treat skin irritations quickly  Know the 5 warning signs of infection: redness, pain, warmth to touch, fever and increased swelling.  Call your physician immediately if you notice any of these signs of a possible infection.  

## 2022-08-17 ENCOUNTER — Telehealth: Payer: Self-pay

## 2022-08-17 NOTE — Telephone Encounter (Signed)
Patient called and informed of recent Signatera results. Patient informed that Johnsie Cancel would be reaching out to schedule her for follow-up testing again in three months. Patient verbalized an understanding of the information and was appreciative of the call.  During call, patient noted that she is agreeable to starting on Tamoxifen. Wilber Bihari, NP made aware and patient knows that we will reach out with further instructions. Scheduling message sent to have patient scheduled for earlier follow-up to discuss with provider in more detail.

## 2022-08-18 ENCOUNTER — Telehealth: Payer: Self-pay | Admitting: Adult Health

## 2022-08-18 NOTE — Telephone Encounter (Signed)
Scheduled per 8/23 in basket, message has been left

## 2022-09-07 ENCOUNTER — Ambulatory Visit: Payer: Commercial Managed Care - HMO | Admitting: Adult Health

## 2022-09-13 ENCOUNTER — Ambulatory Visit: Payer: Commercial Managed Care - HMO | Attending: General Surgery

## 2022-09-13 VITALS — Wt 139.5 lb

## 2022-09-13 DIAGNOSIS — Z483 Aftercare following surgery for neoplasm: Secondary | ICD-10-CM | POA: Insufficient documentation

## 2022-09-13 NOTE — Therapy (Addendum)
OUTPATIENT PHYSICAL THERAPY SOZO SCREENING NOTE   Patient Name: Sheena Mcdaniel MRN: [id number] DOB:1971/06/17, 51 y.o., female Today's Date: 09/13/2022  PCP: Linward Natal, MD REFERRING PROVIDER: Jovita Kussmaul, MD   PT End of Session - 09/13/22 1155     Visit Number 8   # unchanged due to screen only   PT Start Time 1153    PT Stop Time 0973    PT Time Calculation (min) 4 min    Activity Tolerance Patient tolerated treatment well    Behavior During Therapy [facility] for tasks assessed/performed             Past Medical History:  Diagnosis Date   Anxiety    Cancer ([place]) 03/2021   left breast IDC   Past Surgical History:  Procedure Laterality Date   MODIFIED MASTECTOMY Left 01/01/2022   Procedure: LEFT MODIFIED RADICAL MASTECTOMY;  Surgeon: Jovita Kussmaul, MD;  Location: [place];  Service: General;  Laterality: Left;   PORT-A-CATH REMOVAL Right 07/22/2022   Procedure: REMOVAL PORT-A-CATH;  Surgeon: Jovita Kussmaul, MD;  Location: [place];  Service: General;  Laterality: Right;   PORTACATH PLACEMENT N/A 05/22/2021   Procedure: INSERTION PORT-A-CATH;  Surgeon: Jovita Kussmaul, MD;  Location: [place];  Service: General;  Laterality: N/A;   TOTAL MASTECTOMY Right 01/01/2022   Procedure: RIGHT TOTAL MASTECTOMY;  Surgeon: Jovita Kussmaul, MD;  Location: [place];  Service: General;  Laterality: Right;   [place] EXTRACTION     Patient Active Problem List   Diagnosis Date Noted   Port-A-Cath in place 06/18/2021   Malignant neoplasm of upper-outer quadrant of left breast in female, estrogen receptor positive ([place]) 05/12/2021   Breast pain, left 04/24/2021   Elevated blood pressure reading 04/24/2021   Perimenopause 04/24/2021    REFERRING DIAG: left breast cancer at risk for lymphedema  THERAPY DIAG:  Aftercare following surgery for neoplasm  PERTINENT HISTORY: Dx of left breast cancer with multiple abnormal lymph nodes back in June. The cancer  was a triple negative with a Ki-67 of 60%. She has undergone neoadjuvant chemotherapy and has had a good response. The cancer now measures 2.9 cm and the lymph nodes all looked normal. bil mastectomy 01/01/22 with 11 lymph nodes removed on left all negative. Drains out now. Radiation coming up on 02/22/22  PRECAUTIONS: left UE Lymphedema risk, None  SUBJECTIVE: Pt returns for her 1 month reassess after having a high change in baseline indicating subclinical lymphedema. "I've been wearingmysleeve every day, even when we were in [place]."  PAIN:  Are you having pain? No  SOZO SCREENING: Patient was assessed today using the SOZO machine to determine the lymphedema index score. This was compared to her baseline score. It was determined that she is within the recommended range when compared to her baseline and no further action is needed at this time. She will continue SOZO screenings. These are done every 3 months for 2 years post operatively followed by every 6 months for 2 years, and then annually. Did encourage pt to cont wearing her compression sleeve for exercise and flying as she is at a higher risk due to having an ALND. Pt verbalized understanding.    L-DEX FLOWSHEETS - 09/13/22 1100       L-DEX LYMPHEDEMA SCREENING   Measurement Type Unilateral    L-DEX MEASUREMENT EXTREMITY Upper Extremity    POSITION  Standing    DOMINANT SIDE Right    At Risk Side  Left    BASELINE SCORE (UNILATERAL) 3    L-DEX SCORE (UNILATERAL) -3.1    VALUE CHANGE (UNILAT) -6.1              Otelia Limes, PTA 09/13/2022, 12:00 PM

## 2022-09-16 ENCOUNTER — Encounter: Payer: Self-pay | Admitting: Adult Health

## 2022-09-16 ENCOUNTER — Inpatient Hospital Stay: Payer: Commercial Managed Care - HMO | Attending: Hematology and Oncology | Admitting: Adult Health

## 2022-09-16 ENCOUNTER — Other Ambulatory Visit: Payer: Self-pay

## 2022-09-16 VITALS — BP 117/63 | HR 110 | Temp 97.7°F | Resp 18 | Ht 64.0 in | Wt 138.4 lb

## 2022-09-16 DIAGNOSIS — C50412 Malignant neoplasm of upper-outer quadrant of left female breast: Secondary | ICD-10-CM | POA: Insufficient documentation

## 2022-09-16 DIAGNOSIS — Z87891 Personal history of nicotine dependence: Secondary | ICD-10-CM | POA: Insufficient documentation

## 2022-09-16 DIAGNOSIS — Z170 Estrogen receptor positive status [ER+]: Secondary | ICD-10-CM | POA: Diagnosis not present

## 2022-09-16 NOTE — Assessment & Plan Note (Addendum)
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    Sheena Mcdaniel is understandably confused about whether she should proceed with the antiestrogen therapy.  We discussed risks and benefits in detail.  She still is very anxious and concerned about this decision.    I asked Dr. Lindi Adie to pop in and talk to her about his opinions around the antiestrogen therapy which she is very appreciative of his willingness to do that.  After their discussion she decided to forego antiestrogen therapy.  We will see her in October for continued follow-up and surveillance.

## 2022-09-16 NOTE — Progress Notes (Signed)
[facility] Follow up:    Sheena Natal, MD [address] [place] [address]   DIAGNOSIS:  Cancer Staging  Malignant neoplasm of upper-outer quadrant of left breast in female, estrogen receptor positive ([place]) Staging form: Breast, AJCC 8th Edition - Clinical stage from 05/12/2021: Stage IIIB (cT3, cN1, cM0, G2, ER-, PR-, HER2-) - Signed by Nicholas Lose, MD on 05/12/2021 Stage prefix: Initial diagnosis Histologic grading system: 3 grade system   SUMMARY OF ONCOLOGIC HISTORY: Oncology History  Malignant neoplasm of upper-outer quadrant of left breast in female, estrogen receptor positive ([place])  05/06/2021 Initial Diagnosis   Large circumscribed multicystic mass left breast 9 to 10 cm with multiple left axillary lymph nodes with cortical thickening: biopsy 3 o'clock position: Grade 2 IDC, ER 10%, PR 0%, Ki-67 60%, HER2 negative Right breast: 0.4 cm mass at 9:30 position: Benign on biopsy   05/12/2021 Cancer Staging   Staging form: Breast, AJCC 8th Edition - Clinical stage from 05/12/2021: Stage IIIB (cT3, cN1, cM0, G2, ER-, PR-, HER2-) - Signed by Nicholas Lose, MD on 05/12/2021 Stage prefix: Initial diagnosis Histologic grading system: 3 grade system   05/30/2021 - 11/27/2021 Chemotherapy   Patient is on Treatment Plan : BREAST Pembrolizumab + AC q21d x 4 cycles followed by Pembrolizumab + Carboplatin D1 + Paclitaxel D1,8,15 q21d X 4 cycles       Surgery   Bilateral mastectomies: No residual cancer 0/11 LN Neg   01/21/2022 - 05/28/2022 Chemotherapy   Patient is on Treatment Plan : BREAST Pembrolizumab (200) q21d x 27 weeks     02/22/2022 - 04/07/2022 Radiation Therapy   Site Technique Total Dose (Gy) Dose per Fx (Gy) Completed Fx Beam Energies  Chest Wall, Left: CW_L 3D 50.4/50.4 1.8 28/28 6X  Chest Wall, Left: CW_L_SCLV 3D 50.4/50.4 1.8 28/28 6X, 10X  Chest Wall, Left: CW_L_Bst Electron 10/10 2 5/5 6E       CURRENT THERAPY: Observation, deciding about  antiestrogen therapy  INTERVAL HISTORY: Sheena Mcdaniel 51 y.o. 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  At her diagnosis she did have weak 10% estrogen positivity.  She was offered anastrozole or tamoxifen to take as adjuvant therapy.  Sheena Mcdaniel has had a really difficult time deciding whether she should take the antiestrogen therapy or not.  She is unclear the direct benefit she will receive considering her cancer was weakly estrogen positive at 10% staining.  She is also concerned about the side effects of the antiestrogen therapy and whether they will potentially do more harm than good.  Sheena Mcdaniel has been working very diligently on [employer] and exercise.  Her diet has just returned to normal from the diarrhea she experienced while receiving the [facility].  She denies any other issues today.    Patient Active Problem List   Diagnosis Date Noted   Malignant neoplasm of upper-outer quadrant of left breast in female, estrogen receptor positive ([place]) 05/12/2021   Breast pain, left 04/24/2021   Elevated blood pressure reading 04/24/2021   Perimenopause 04/24/2021    has No Known Allergies.  MEDICAL HISTORY: Past Medical History:  Diagnosis Date   Anxiety    Cancer ([place]) 03/2021   left breast IDC   Port-A-Cath in place 06/18/2021    SURGICAL HISTORY: Past Surgical History:  Procedure Laterality Date   MODIFIED MASTECTOMY Left 01/01/2022   Procedure: LEFT MODIFIED RADICAL MASTECTOMY;  Surgeon: Jovita Kussmaul, MD;  Location: [place];  Service: General;  Laterality: Left;   PORT-A-CATH REMOVAL Right 07/22/2022   Procedure: REMOVAL PORT-A-CATH;  Surgeon: Jovita Kussmaul, MD;  Location: [place];  Service: General;  Laterality: Right;   PORTACATH PLACEMENT N/A 05/22/2021   Procedure: INSERTION PORT-A-CATH;  Surgeon: Jovita Kussmaul, MD;  Location: [place];  Service: General;  Laterality: N/A;   TOTAL MASTECTOMY  Right 01/01/2022   Procedure: RIGHT TOTAL MASTECTOMY;  Surgeon: Jovita Kussmaul, MD;  Location: [place];  Service: General;  Laterality: Right;   WISDOM TOOTH EXTRACTION      SOCIAL HISTORY: Social History   Socioeconomic History   Marital status: Married    Spouse name: Not on file   Number of children: Not on file   Years of education: Not on file   Highest education level: Not on file  Occupational History   Not on file  Tobacco Use   Smoking status: Former   Smokeless tobacco: Never   Tobacco comments:    Socially in college  Vaping Use   Vaping Use: Never used  Substance and Sexual Activity   Alcohol use: Not Currently    Comment: social   Drug use: Never   Sexual activity: Yes    Birth control/protection: None  Other Topics Concern   Not on file  Social History Narrative   Not on file   Social Determinants of Health   Financial Resource Strain: Not on file  Food Insecurity: Not on file  Transportation Needs: Not on file  Physical Activity: Not on file  Stress: Not on file  Social Connections: Not on file  Intimate Partner Violence: Not on file    FAMILY HISTORY: History reviewed. No pertinent family history.  Review of Systems  Constitutional:  Negative for appetite change, chills, fatigue, fever and unexpected weight change.  HENT:   Negative for hearing loss, lump/mass and trouble swallowing.   Eyes:  Negative for eye problems and icterus.  Respiratory:  Negative for chest tightness, cough and shortness of breath.   Cardiovascular:  Negative for chest pain, leg swelling and palpitations.  Gastrointestinal:  Negative for abdominal distention, abdominal pain, constipation, diarrhea, nausea and vomiting.  Endocrine: Negative for hot flashes.  Genitourinary:  Negative for difficulty urinating.   Musculoskeletal:  Negative for arthralgias.  Skin:  Negative for itching and rash.  Neurological:  Negative for dizziness, extremity weakness, headaches and numbness.   Hematological:  Negative for adenopathy. Does not bruise/bleed easily.  Psychiatric/Behavioral:  Negative for depression. The patient is not nervous/anxious.       PHYSICAL EXAMINATION  ECOG PERFORMANCE STATUS: 0  Vitals:   09/16/22 1107  BP: 117/63  Pulse: (!) 110  Resp: 18  Temp: 97.7 F (36.5 C)  SpO2: 100%    Physical Exam Constitutional:      General: She is not in acute distress.    Appearance: Normal appearance. She is not toxic-appearing.  HENT:     Head: Normocephalic and atraumatic.  Eyes:     General: No scleral icterus. Cardiovascular:     Rate and Rhythm: Normal rate and regular rhythm.     Pulses: Normal pulses.     Heart sounds: Normal heart sounds.  Pulmonary:     Effort: Pulmonary effort is normal.     Breath sounds: Normal breath sounds.  Abdominal:     General: Abdomen is flat. Bowel sounds are normal. There is no distension.     Palpations: Abdomen is soft.     Tenderness:  There is no abdominal tenderness.  Musculoskeletal:        General: No swelling.     Cervical back: Neck supple.  Lymphadenopathy:     Cervical: No cervical adenopathy.  Skin:    General: Skin is warm and dry.     Findings: No rash.  Neurological:     General: No focal deficit present.     Mental Status: She is alert.  Psychiatric:        Mood and Affect: Mood normal.        Behavior: Behavior normal.     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    Shayle is understandably confused about whether she should proceed with the antiestrogen therapy.  We discussed risks and benefits  in detail.  She still is very anxious and concerned about this decision.    I asked Dr. Lindi Adie to pop in and talk to her about his opinions around the antiestrogen therapy which she is very appreciative of his willingness to do that.  After their discussion she decided to forego antiestrogen therapy.  We will see her in October for continued follow-up and surveillance.   All questions were answered. The patient knows to call the clinic with any problems, questions or concerns. We can certainly see the patient much sooner if necessary.     Wilber Bihari, NP 09/17/22 5:54 PM Medical Oncology and Hematology [facility] [place], [place] [address] Tel. [phone]    Fax. [phone]  *Total Encounter Time as defined by the Centers for Medicare and Medicaid Services includes, in addition to the face-to-face time of a patient visit (documented in the note above) non-face-to-face time: obtaining and reviewing outside history, ordering and reviewing medications, tests or procedures, care coordination (communications with other health care professionals or caregivers) and documentation in the medical record.  Attending Note  I personally saw and examined Sheena Mcdaniel. The plan of care was discussed with her. I agree with the physical exam findings and assessment and plan as documented above. I performed the majority of the counseling and assessment and plan regarding this encounter -I counseled her extensively about the risks and benefits of antiestrogen therapy and recommended that she would not get much benefit from it and therefore she does not have to receive it. -This is exactly what she wants to do as well.  Signed Harriette Ohara, MD

## 2022-09-17 ENCOUNTER — Encounter: Payer: Self-pay | Admitting: Adult Health

## 2022-10-08 ENCOUNTER — Inpatient Hospital Stay: Payer: Commercial Managed Care - HMO | Admitting: Hematology and Oncology

## 2022-10-08 NOTE — Assessment & Plan Note (Deleted)
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%, HER2 negative Right breast: 0.4 cm mass at 9:30 position: Benign on biopsy  Treatment plan: 1.Neoadjuvant chemotherapy with Adriamycin and Cytoxan along with pembrolizumab followed by Taxol carboplatin and pembrolizumabcompleted 11/27/2021(pembrolizumab maintenance for 1 year) completed 05/27/2022 2.01/01/2022 bilateral Mastectomies: No residual cancer, 0/11 LN neg 3.Adjuvant radiation2/28/2023-04/07/2022 4.Followed by adjuvant antiestrogen therapy (since she is 10% ER positive): After discussing pros and cons of antiestrogen therapy we decided to not use it because of very minimal benefit ---------------------------------------------------------------------------------------------------------------------------------------------- Breast cancer surveillance: 1.  No role of imaging since she had bilateral mastectomies 2. 07/20/2022 Signatera testing: Negative Return to clinic in 6 months for follow-up

## 2022-10-21 NOTE — Progress Notes (Signed)
Patient Care Team: Linward Natal, MD as PCP - General Nicholas Lose, MD as Consulting Physician (Hematology and Oncology) Kyung Rudd, MD as Consulting Physician (Radiation Oncology) Jovita Kussmaul, MD as Consulting Physician (General Surgery)  DIAGNOSIS:  Encounter Diagnosis  Name Primary?   Malignant neoplasm of upper-outer quadrant of left breast in female, estrogen receptor positive ([place])     SUMMARY OF ONCOLOGIC HISTORY: Oncology History  Malignant neoplasm of upper-outer quadrant of left breast in female, estrogen receptor positive (Sheena Mcdaniel)  05/06/2021 Initial Diagnosis   Large circumscribed multicystic mass left breast 9 to 10 cm with multiple left axillary lymph nodes with cortical thickening: biopsy 3 o'clock position: Grade 2 IDC, ER 10%, PR 0%, Ki-67 60%, HER2 negative Right breast: 0.4 cm mass at 9:30 position: Benign on biopsy   05/12/2021 Cancer Staging   Staging form: Breast, AJCC 8th Edition - Clinical stage from 05/12/2021: Stage IIIB (cT3, cN1, cM0, G2, ER-, PR-, HER2-) - Signed by Nicholas Lose, MD on 05/12/2021 Stage prefix: Initial diagnosis Histologic grading system: 3 grade system   05/30/2021 - 11/27/2021 Chemotherapy   Patient is on Treatment Plan : BREAST Pembrolizumab + AC q21d x 4 cycles followed by Pembrolizumab + Carboplatin D1 + Paclitaxel D1,8,15 q21d X 4 cycles       Surgery   Bilateral mastectomies: No residual cancer 0/11 LN Neg   01/21/2022 - 05/28/2022 Chemotherapy   Patient is on Treatment Plan : BREAST Pembrolizumab (200) q21d x 27 weeks     02/22/2022 - 04/07/2022 Radiation Therapy   Site Technique Total Dose (Gy) Dose per Fx (Gy) Completed Fx Beam Energies  Chest Wall, Left: CW_L 3D 50.4/50.4 1.8 28/28 6X  Chest Wall, Left: CW_L_SCLV 3D 50.4/50.4 1.8 28/28 6X, 10X  Chest Wall, Left: CW_L_Bst Electron 10/10 2 5/5 6E       CHIEF COMPLIANT: Follow-up breast cancer surveillance  INTERVAL HISTORY: Sheena Mcdaniel is a 51 y.o diagnose with left  breast cancer currently on surveillance. She states that her energy is better. She [place] the [place] for exercise. She does have some discomfort in breast. Some normal pain. She does have some body aches and pain  ALLERGIES:  has No Known Allergies.    PHYSICAL EXAMINATION: ECOG PERFORMANCE STATUS: 1 - Symptomatic but completely ambulatory  Vitals:   10/22/22 0900  BP: (!) 141/82  Pulse: 73  Resp: 18  Temp: (!) 97.5 F (36.4 C)  SpO2: 99%   Filed Weights   10/22/22 0900  Weight: 139 lb 11.2 oz (63.4 kg)      LABORATORY DATA:  I have reviewed the data as listed    Latest Ref Rng & Units 05/28/2022    9:59 AM 05/07/2022    9:13 AM 04/16/2022    9:34 AM  CMP  Glucose 70 - 99 mg/dL 119  112  97   BUN 6 - 20 mg/dL 14  12  13    Creatinine 0.44 - 1.00 mg/dL 0.74  0.73  0.67   Sodium 135 - 145 mmol/L 139  140  142   Potassium 3.5 - 5.1 mmol/L 4.0  3.9  3.9   Chloride 98 - 111 mmol/L 106  107  107   CO2 22 - 32 mmol/L 29  29  29    Calcium 8.9 - 10.3 mg/dL 9.0  8.9  9.0   Total Protein 6.5 - 8.1 g/dL 5.3  5.5  5.9   Total Bilirubin 0.3 - 1.2 mg/dL 0.3  0.5  0.4  Alkaline Phos 38 - 126 U/L 61  64  64   AST 15 - 41 U/L 24  25  19    ALT 0 - 44 U/L 15  18  13      Lab Results  Component Value Date   WBC 14.7 (H) 05/28/2022   HGB 10.5 (L) 05/28/2022   HCT 32.6 (L) 05/28/2022   MCV 84.0 05/28/2022   PLT 301 05/28/2022   NEUTROABS 2.6 05/28/2022    ASSESSMENT & PLAN:  Malignant neoplasm of upper-outer quadrant of left breast in female, estrogen receptor positive ([name]) 05/06/2021: Large circumscribed multicystic mass left breast 9 to 10 cm with multiple left axillary lymph nodes with cortical thickening: biopsy 3 o'clock position: Grade 2 IDC, 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  Neoadjuvant chemotherapy with Adriamycin and Cytoxan along with pembrolizumab followed by Taxol carboplatin  and pembrolizumab completed 11/27/2021 (pembrolizumab maintenance for 1 year) completed 05/27/2022 2. 01/01/2022 bilateral Mastectomies: No residual cancer, 0/11 LN neg 3.  Adjuvant radiation 02/23/2022-04/07/2022 4.  Followed by adjuvant antiestrogen therapy (since she is 10% ER positive): After discussing pros and cons of antiestrogen therapy we decided to not use it because of very minimal benefit ---------------------------------------------------------------------------------------------------------------------------------------------- Breast cancer surveillance: 1.  No role of imaging since she had bilateral mastectomies 2. 07/20/2022 Signatera testing: Negative 3.  Chest wall examination: No palpable lumps or nodules of concern.  There is slight amount of extra tissue in the right chest wall which she eventually might get it removed.  Survivorship: She walks 5 miles every day at a very brisk pace. Genetic testing will be ordered. Return to clinic in 6 months for follow-up   No orders of the defined types were placed in this encounter.  The patient has a good understanding of the overall plan. she agrees with it. she will call with any problems that may develop before the next visit here. Total time spent: 30 mins including face to face time and time spent for planning, charting and co-ordination of care   Harriette Ohara, MD 10/22/22    I Gardiner Coins am scribing for Dr. Lindi Adie  I have reviewed the above documentation for accuracy and completeness, and I agree with the above.

## 2022-10-22 ENCOUNTER — Inpatient Hospital Stay: Payer: Commercial Managed Care - HMO | Attending: Hematology and Oncology | Admitting: Hematology and Oncology

## 2022-10-22 DIAGNOSIS — Z170 Estrogen receptor positive status [ER+]: Secondary | ICD-10-CM | POA: Insufficient documentation

## 2022-10-22 DIAGNOSIS — Z9013 Acquired absence of bilateral breasts and nipples: Secondary | ICD-10-CM | POA: Insufficient documentation

## 2022-10-22 DIAGNOSIS — C50412 Malignant neoplasm of upper-outer quadrant of left female breast: Secondary | ICD-10-CM | POA: Insufficient documentation

## 2022-10-22 DIAGNOSIS — Z923 Personal history of irradiation: Secondary | ICD-10-CM | POA: Diagnosis not present

## 2022-10-22 NOTE — Assessment & Plan Note (Signed)
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%, HER2 negative Right breast: 0.4 cm mass at 9:30 position: Benign on biopsy  Treatment plan: 1.Neoadjuvant chemotherapy with Adriamycin and Cytoxan along with pembrolizumab followed by Taxol carboplatin and pembrolizumabcompleted 11/27/2021(pembrolizumab maintenance for 1 year) completed 05/27/2022 2.01/01/2022 bilateral Mastectomies: No residual cancer, 0/11 LN neg 3.Adjuvant radiation2/28/2023-04/07/2022 4.Followed by adjuvant antiestrogen therapy (since she is 10% ER positive): After discussing pros and cons of antiestrogen therapy we decided to not use it because of very minimal benefit ---------------------------------------------------------------------------------------------------------------------------------------------- Breast cancer surveillance: 1.  No role of imaging since she had bilateral mastectomies 2. 07/20/2022 Signatera testing: Negative Return to clinic in 6 months for follow-up

## 2022-10-28 ENCOUNTER — Telehealth: Payer: Self-pay | Admitting: *Deleted

## 2022-10-28 NOTE — Telephone Encounter (Signed)
Per MD request, RN placed call to pt regarding negative (Not Detected) recent Signatera testing. No answer, lvm for pt to return call to the office.

## 2022-10-29 ENCOUNTER — Encounter: Payer: Self-pay | Admitting: Hematology and Oncology

## 2022-11-08 ENCOUNTER — Ambulatory Visit: Payer: Commercial Managed Care - HMO

## 2022-12-13 ENCOUNTER — Ambulatory Visit: Payer: Commercial Managed Care - HMO | Attending: General Surgery

## 2022-12-13 VITALS — Wt 142.2 lb

## 2022-12-13 DIAGNOSIS — Z483 Aftercare following surgery for neoplasm: Secondary | ICD-10-CM | POA: Insufficient documentation

## 2022-12-13 NOTE — Therapy (Signed)
OUTPATIENT PHYSICAL THERAPY SOZO SCREENING NOTE   Patient Name: Sheena Mcdaniel MRN: [id number] DOB:Dec 27, 1971, 51 y.o., female Today's Date: 12/13/2022  PCP: Linward Natal, MD REFERRING PROVIDER: Jovita Kussmaul, MD   PT End of Session - 12/13/22 1513     Visit Number 8   # unchanged due to screen only   PT Start Time 1512    PT Stop Time 1516    PT Time Calculation (min) 4 min    Activity Tolerance Patient tolerated treatment well    Behavior During Therapy [facility] for tasks assessed/performed             Past Medical History:  Diagnosis Date   Anxiety    Cancer ([place]) 03/2021   left breast IDC   Port-A-Cath in place 06/18/2021   Past Surgical History:  Procedure Laterality Date   MODIFIED MASTECTOMY Left 01/01/2022   Procedure: LEFT MODIFIED RADICAL MASTECTOMY;  Surgeon: Jovita Kussmaul, MD;  Location: [place];  Service: General;  Laterality: Left;   PORT-A-CATH REMOVAL Right 07/22/2022   Procedure: REMOVAL PORT-A-CATH;  Surgeon: Jovita Kussmaul, MD;  Location: [place];  Service: General;  Laterality: Right;   PORTACATH PLACEMENT N/A 05/22/2021   Procedure: INSERTION PORT-A-CATH;  Surgeon: Jovita Kussmaul, MD;  Location: [place];  Service: General;  Laterality: N/A;   TOTAL MASTECTOMY Right 01/01/2022   Procedure: RIGHT TOTAL MASTECTOMY;  Surgeon: Jovita Kussmaul, MD;  Location: [place];  Service: General;  Laterality: Right;   [place] EXTRACTION     Patient Active Problem List   Diagnosis Date Noted   Malignant neoplasm of upper-outer quadrant of left breast in female, estrogen receptor positive ([place]) 05/12/2021   Breast pain, left 04/24/2021   Elevated blood pressure reading 04/24/2021   Perimenopause 04/24/2021    REFERRING DIAG: left breast cancer at risk for lymphedema  THERAPY DIAG: Aftercare following surgery for neoplasm  PERTINENT HISTORY: Dx of left breast cancer with multiple abnormal lymph nodes back in June. The cancer  was a triple negative with a Ki-67 of 60%. She has undergone neoadjuvant chemotherapy and has had a good response. The cancer now measures 2.9 cm and the lymph nodes all looked normal. bil mastectomy 01/01/22 with 11 lymph nodes removed on left all negative. Drains out now. Radiation coming up on 02/22/22  PRECAUTIONS: left UE Lymphedema risk, None  SUBJECTIVE: Pt returns for her 1 month reassess after having a high change in baseline indicating subclinical lymphedema. "I've been wearingmysleeve every day, even when we were in [place]."  PAIN:  Are you having pain? No  SOZO SCREENING: Patient was assessed today using the SOZO machine to determine the lymphedema index score. This was compared to her baseline score. It was determined that she is within the recommended range when compared to her baseline and no further action is needed at this time. She will continue SOZO screenings. These are done every 3 months for 2 years post operatively followed by every 6 months for 2 years, and then annually. Did encourage pt to cont wearing her compression sleeve for exercise and flying as she is at a higher risk due to having an ALND. Pt verbalized understanding.    L-DEX FLOWSHEETS - 12/13/22 1500       L-DEX LYMPHEDEMA SCREENING   Measurement Type Unilateral    L-DEX MEASUREMENT EXTREMITY Upper Extremity    POSITION  Standing    DOMINANT SIDE Right    At Risk Side Left  BASELINE SCORE (UNILATERAL) 3    L-DEX SCORE (UNILATERAL) 9    VALUE CHANGE (UNILAT) 6              Otelia Limes, PTA 12/13/2022, 3:15 PM

## 2022-12-29 ENCOUNTER — Inpatient Hospital Stay: Payer: Commercial Managed Care - HMO | Admitting: Genetic Counselor

## 2022-12-29 ENCOUNTER — Inpatient Hospital Stay: Payer: Commercial Managed Care - HMO

## 2023-02-04 ENCOUNTER — Telehealth: Payer: Self-pay

## 2023-02-04 NOTE — Telephone Encounter (Signed)
Attempted to call pt regarding signatera results lvm for pt to return call back.   

## 2023-02-07 ENCOUNTER — Encounter: Payer: Self-pay | Admitting: Hematology and Oncology

## 2023-03-07 ENCOUNTER — Ambulatory Visit: Payer: Commercial Managed Care - HMO | Attending: General Surgery

## 2023-03-07 VITALS — Wt 145.1 lb

## 2023-03-07 DIAGNOSIS — Z483 Aftercare following surgery for neoplasm: Secondary | ICD-10-CM | POA: Insufficient documentation

## 2023-03-07 NOTE — Therapy (Signed)
OUTPATIENT PHYSICAL THERAPY SOZO SCREENING NOTE   Patient Name: Sheena Mcdaniel MRN: [id number] DOB:1971-05-11, 52 y.o., female Today's Date: 03/07/2023  PCP: Linward Natal, MD REFERRING PROVIDER: Jovita Kussmaul, MD   PT End of Session - 03/07/23 1108     Visit Number 8   # unchnaged due to screen only   PT Start Time 1105    PT Stop Time 1110    PT Time Calculation (min) 5 min    Activity Tolerance Patient tolerated treatment well    Behavior During Therapy [facility] for tasks assessed/performed             Past Medical History:  Diagnosis Date   Anxiety    Cancer ([place]) 03/2021   left breast IDC   Port-A-Cath in place 06/18/2021   Past Surgical History:  Procedure Laterality Date   MODIFIED MASTECTOMY Left 01/01/2022   Procedure: LEFT MODIFIED RADICAL MASTECTOMY;  Surgeon: Jovita Kussmaul, MD;  Location: [place];  Service: General;  Laterality: Left;   PORT-A-CATH REMOVAL Right 07/22/2022   Procedure: REMOVAL PORT-A-CATH;  Surgeon: Jovita Kussmaul, MD;  Location: [place];  Service: General;  Laterality: Right;   PORTACATH PLACEMENT N/A 05/22/2021   Procedure: INSERTION PORT-A-CATH;  Surgeon: Jovita Kussmaul, MD;  Location: [place];  Service: General;  Laterality: N/A;   TOTAL MASTECTOMY Right 01/01/2022   Procedure: RIGHT TOTAL MASTECTOMY;  Surgeon: Jovita Kussmaul, MD;  Location: [place];  Service: General;  Laterality: Right;   [place] EXTRACTION     Patient Active Problem List   Diagnosis Date Noted   Malignant neoplasm of upper-outer quadrant of left breast in female, estrogen receptor positive ([place]) 05/12/2021   Breast pain, left 04/24/2021   Elevated blood pressure reading 04/24/2021   Perimenopause 04/24/2021    REFERRING DIAG: left breast cancer at risk for lymphedema  THERAPY DIAG: Aftercare following surgery for neoplasm  PERTINENT HISTORY: Dx of left breast cancer with multiple abnormal lymph nodes back in June. The cancer was  a triple negative with a Ki-67 of 60%. She has undergone neoadjuvant chemotherapy and has had a good response. The cancer now measures 2.9 cm and the lymph nodes all looked normal. bil mastectomy 01/01/22 with 11 lymph nodes removed on left all negative. Drains out now. Radiation coming up on 02/22/22  PRECAUTIONS: left UE Lymphedema risk, None  SUBJECTIVE: Pt returns for her 3 month L-Dex screen.  PAIN:  Are you having pain? No  SOZO SCREENING: Patient was assessed today using the SOZO machine to determine the lymphedema index score. This was compared to her baseline score. It was determined that she is within the recommended range when compared to her baseline and no further action is needed at this time. She will continue SOZO screenings. These are done every 3 months for 2 years post operatively followed by every 6 months for 2 years, and then annually.  Answered pts questions regarding how often she should wear her compression sleeve now that her score is normal but she's had an ALND. Encouraged her to wear her sleeve during times of increased or highly repetitive activity, especially since the weather will be warming up and lymphedema can increase with the warmer weather. She verbalized understanding.   L-DEX FLOWSHEETS - 03/07/23 1100       L-DEX LYMPHEDEMA SCREENING   Measurement Type Unilateral    L-DEX MEASUREMENT EXTREMITY Upper Extremity    POSITION  Standing    DOMINANT SIDE  Right    At Risk Side Left    BASELINE SCORE (UNILATERAL) 3    L-DEX SCORE (UNILATERAL) 5.9    VALUE CHANGE (UNILAT) 2.9              Otelia Limes, PTA 03/07/2023, 11:10 AM

## 2023-03-15 ENCOUNTER — Other Ambulatory Visit: Payer: Self-pay | Admitting: *Deleted

## 2023-03-15 DIAGNOSIS — Z170 Estrogen receptor positive status [ER+]: Secondary | ICD-10-CM

## 2023-04-24 NOTE — Assessment & Plan Note (Signed)
05/06/2021: Large circumscribed multicystic mass left breast 9 to 10 cm with multiple left axillary lymph nodes with cortical thickening: biopsy 3 o'clock position: Grade 2 IDC, 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  Neoadjuvant chemotherapy with Adriamycin and Cytoxan along with pembrolizumab followed by Taxol carboplatin and pembrolizumab completed 11/27/2021 (pembrolizumab maintenance for 1 year) completed 05/27/2022 2. 01/01/2022 bilateral Mastectomies: No residual cancer, 0/11 LN neg 3.  Adjuvant radiation 02/23/2022-04/07/2022 4.  Followed by adjuvant antiestrogen therapy (since she is 10% ER positive): After discussing pros and cons of antiestrogen therapy we decided to not use it because of very minimal benefit ---------------------------------------------------------------------------------------------------------------------------------------------- Breast cancer surveillance: 1.  No role of imaging since she had bilateral mastectomies 2. 07/20/2022 Signatera testing: Negative 3.  Chest wall examination: No palpable lumps or nodules of concern.  There is slight amount of extra tissue in the right chest wall which she eventually might get it removed.   Survivorship: She walks 5 miles every day at a very brisk pace. Genetic testing will be ordered. Return to clinic in 6 months for follow-up

## 2023-04-25 ENCOUNTER — Other Ambulatory Visit: Payer: Self-pay | Admitting: *Deleted

## 2023-04-25 ENCOUNTER — Inpatient Hospital Stay: Payer: Commercial Managed Care - HMO | Attending: Hematology and Oncology | Admitting: Hematology and Oncology

## 2023-04-25 VITALS — BP 153/81 | HR 76 | Temp 97.8°F | Resp 18 | Ht 64.0 in | Wt 146.2 lb

## 2023-04-25 DIAGNOSIS — C50412 Malignant neoplasm of upper-outer quadrant of left female breast: Secondary | ICD-10-CM | POA: Diagnosis not present

## 2023-04-25 DIAGNOSIS — Z853 Personal history of malignant neoplasm of breast: Secondary | ICD-10-CM | POA: Insufficient documentation

## 2023-04-25 DIAGNOSIS — Z170 Estrogen receptor positive status [ER+]: Secondary | ICD-10-CM

## 2023-04-25 NOTE — Progress Notes (Signed)
Per MD request referral placed to social work for counseling.

## 2023-04-25 NOTE — Progress Notes (Signed)
Patient Care Team: Adron Bene, MD as PCP - General Serena Croissant, MD as Consulting Physician (Hematology and Oncology) Dorothy Puffer, MD as Consulting Physician (Radiation Oncology) Griselda Miner, MD as Consulting Physician (General Surgery)  DIAGNOSIS:  Encounter Diagnosis  Name Primary?   Malignant neoplasm of upper-outer quadrant of left breast in female, estrogen receptor positive ([facility]) Yes    SUMMARY OF ONCOLOGIC HISTORY: Oncology History  Malignant neoplasm of upper-outer quadrant of left breast in female, estrogen receptor positive ([facility])  05/06/2021 Initial Diagnosis   Large circumscribed multicystic mass left breast 9 to 10 cm with multiple left axillary lymph nodes with cortical thickening: biopsy 3 o'clock position: Grade 2 IDC, ER 10%, PR 0%, Ki-67 60%, HER2 negative Right breast: 0.4 cm mass at 9:30 position: Benign on biopsy   05/12/2021 Cancer Staging   Staging form: Breast, AJCC 8th Edition - Clinical stage from 05/12/2021: Stage IIIB (cT3, cN1, cM0, G2, ER-, PR-, HER2-) - Signed by Serena Croissant, MD on 05/12/2021 Stage prefix: Initial diagnosis Histologic grading system: 3 grade system   05/30/2021 - 11/27/2021 Chemotherapy   Patient is on Treatment Plan : BREAST Pembrolizumab + AC q21d x 4 cycles followed by Pembrolizumab + Carboplatin D1 + Paclitaxel D1,8,15 q21d X 4 cycles       Surgery   Bilateral mastectomies: No residual cancer 0/11 LN Neg   01/21/2022 - 05/28/2022 Chemotherapy   Patient is on Treatment Plan : BREAST Pembrolizumab (200) q21d x 27 weeks     02/22/2022 - 04/07/2022 Radiation Therapy   Site Technique Total Dose (Gy) Dose per Fx (Gy) Completed Fx Beam Energies  Chest Wall, Left: CW_L 3D 50.4/50.4 1.8 28/28 6X  Chest Wall, Left: CW_L_SCLV 3D 50.4/50.4 1.8 28/28 6X, 10X  Chest Wall, Left: CW_L_Bst Electron 10/10 2 5/5 6E       CHIEF COMPLIANT: Follow-up breast cancer surveillance   INTERVAL HISTORY: Sheena Mcdaniel is a 52 y.o diagnose with  left breast cancer currently on surveillance.  She reports her energy is good. She is doing some jogging and running. She denies having any numbness and tingling in fingers and toes. She denies any pain or discomfort in breast. She does have some soreness.  ALLERGIES:  has No Known Allergies.  PHYSICAL EXAMINATION: ECOG PERFORMANCE STATUS: 1 - Symptomatic but completely ambulatory  Vitals:   04/25/23 1121  BP: (!) 153/81  Pulse: 76  Resp: 18  Temp: 97.8 F (36.6 C)  SpO2: 100%   Filed Weights   04/25/23 1121  Weight: 146 lb 3.2 oz (66.3 kg)    BREAST: No palpable lumps or nodules to bilateral chest wall axilla.  There is a small amount of extra tissue on the medial aspect of the right chest wall.. (exam performed in the presence of a chaperone)  LABORATORY DATA:  I have reviewed the data as listed    Latest Ref Rng & Units 05/28/2022    9:59 AM 05/07/2022    9:13 AM 04/16/2022    9:34 AM  CMP  Glucose 70 - 99 mg/dL 409  811  97   BUN 6 - 20 mg/dL 14  12  13    Creatinine 0.44 - 1.00 mg/dL 9.14  7.82  9.56   Sodium 135 - 145 mmol/L 139  140  142   Potassium 3.5 - 5.1 mmol/L 4.0  3.9  3.9   Chloride 98 - 111 mmol/L 106  107  107   CO2 22 - 32 mmol/L 29  29  29   Calcium 8.9 - 10.3 mg/dL 9.0  8.9  9.0   Total Protein 6.5 - 8.1 g/dL 5.3  5.5  5.9   Total Bilirubin 0.3 - 1.2 mg/dL 0.3  0.5  0.4   Alkaline Phos 38 - 126 U/L 61  64  64   AST 15 - 41 U/L 24  25  19    ALT 0 - 44 U/L 15  18  13      Lab Results  Component Value Date   WBC 14.7 (H) 05/28/2022   HGB 10.5 (L) 05/28/2022   HCT 32.6 (L) 05/28/2022   MCV 84.0 05/28/2022   PLT 301 05/28/2022   NEUTROABS 2.6 05/28/2022    ASSESSMENT & PLAN:  Malignant neoplasm of upper-outer quadrant of left breast in female, estrogen receptor positive ([facility]) 05/06/2021: Large circumscribed multicystic mass left breast 9 to 10 cm with multiple left axillary lymph nodes with cortical thickening: biopsy 3 o'clock position: Grade 2 IDC,  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  Neoadjuvant chemotherapy with Adriamycin and Cytoxan along with pembrolizumab followed by Taxol carboplatin and pembrolizumab completed 11/27/2021 (pembrolizumab maintenance for 1 year) completed 05/27/2022 2. 01/01/2022 bilateral Mastectomies: No residual cancer, 0/11 LN neg 3.  Adjuvant radiation 02/23/2022-04/07/2022 4.  Followed by adjuvant antiestrogen therapy (since she is 10% ER positive): After discussing pros and cons of antiestrogen therapy we decided to not use it because of very minimal benefit ---------------------------------------------------------------------------------------------------------------------------------------------- Breast cancer surveillance: 1.  No role of imaging since she had bilateral mastectomies 2. 07/20/2022 Signatera testing: Negative 3.  Chest wall examination: No palpable lumps or nodules of concern.     Survivorship: She walks 4-5 miles every day at a very brisk pace. Genetic testing will be ordered. Return to clinic in 1 year for follow-up    No orders of the defined types were placed in this encounter.  The patient has a good understanding of the overall plan. she agrees with it. she will call with any problems that may develop before the next visit here. Total time spent: 30 mins including face to face time and time spent for planning, charting and co-ordination of care   Tamsen Meek, MD 04/25/23    I Janan Ridge am acting as a Neurosurgeon for [employer]  I have reviewed the above documentation for accuracy and completeness, and I agree with the above.

## 2023-04-26 ENCOUNTER — Telehealth: Payer: Self-pay | Admitting: Licensed Clinical Social Worker

## 2023-04-26 NOTE — Telephone Encounter (Signed)
[facility] Clinical Social Work  Clinical Social Work was referred by medical provider for counseling.  Clinical Social Worker attempted to contact patient by phone to offer support and assess for needs.   No answer. Left VM with direct contact information.     Sheena Merlin E Chuckie Mccathern, LCSW  Clinical Social Worker [facility]

## 2023-04-27 LAB — SIGNATERA ONLY (NATERA MANAGED)
SIGNATERA MTM READOUT: 0 MTM/ml
SIGNATERA TEST RESULT: NEGATIVE

## 2023-06-20 ENCOUNTER — Ambulatory Visit: Payer: Commercial Managed Care - HMO | Attending: General Surgery

## 2023-06-20 VITALS — Wt 148.2 lb

## 2023-06-20 DIAGNOSIS — Z483 Aftercare following surgery for neoplasm: Secondary | ICD-10-CM | POA: Insufficient documentation

## 2023-06-20 NOTE — Therapy (Signed)
OUTPATIENT PHYSICAL THERAPY SOZO SCREENING NOTE   Patient Name: Sheena Mcdaniel MRN: [id number] DOB:1971-09-13, 52 y.o., female Today's Date: 06/20/2023  PCP: No primary care provider on file. REFERRING PROVIDER: Griselda Miner, MD   PT End of Session - 06/20/23 1011     Visit Number 8   # unchanged due to screen only   PT Start Time 1010    PT Stop Time 1013    PT Time Calculation (min) 3 min    Activity Tolerance Patient tolerated treatment well    Behavior During Therapy [facility] for tasks assessed/performed             Past Medical History:  Diagnosis Date   Anxiety    Cancer (HCC) 03/2021   left breast IDC   Port-A-Cath in place 06/18/2021   Past Surgical History:  Procedure Laterality Date   MODIFIED MASTECTOMY Left 01/01/2022   Procedure: LEFT MODIFIED RADICAL MASTECTOMY;  Surgeon: Griselda Miner, MD;  Location: [facility] OR;  Service: General;  Laterality: Left;   PORT-A-CATH REMOVAL Right 07/22/2022   Procedure: REMOVAL PORT-A-CATH;  Surgeon: Griselda Miner, MD;  Location: [facility];  Service: General;  Laterality: Right;   PORTACATH PLACEMENT N/A 05/22/2021   Procedure: INSERTION PORT-A-CATH;  Surgeon: Griselda Miner, MD;  Location: [facility];  Service: General;  Laterality: N/A;   TOTAL MASTECTOMY Right 01/01/2022   Procedure: RIGHT TOTAL MASTECTOMY;  Surgeon: Griselda Miner, MD;  Location: [facility] OR;  Service: General;  Laterality: Right;   WISDOM TOOTH EXTRACTION     Patient Active Problem List   Diagnosis Date Noted   Malignant neoplasm of upper-outer quadrant of left breast in female, estrogen receptor positive (HCC) 05/12/2021   Breast pain, left 04/24/2021   Elevated blood pressure reading 04/24/2021   Perimenopause 04/24/2021    REFERRING DIAG: left breast cancer at risk for lymphedema  THERAPY DIAG: Aftercare following surgery for neoplasm  PERTINENT HISTORY: Dx of left breast cancer with multiple abnormal lymph nodes back in June.  The cancer was a triple negative with a Ki-67 of 60%. She has undergone neoadjuvant chemotherapy and has had a good response. The cancer now measures 2.9 cm and the lymph nodes all looked normal. bil mastectomy 01/01/22 with 11 lymph nodes removed on left all negative. Drains out now. Radiation coming up on 02/22/22  PRECAUTIONS: left UE Lymphedema risk, None  SUBJECTIVE: Pt returns for her 3 month L-Dex screen.  PAIN:  Are you having pain? No  SOZO SCREENING: Patient was assessed today using the SOZO machine to determine the lymphedema index score. This was compared to her baseline score. It was determined that she is within the recommended range when compared to her baseline and no further action is needed at this time. She will continue SOZO screenings. These are done every 3 months for 2 years post operatively followed by every 6 months for 2 years, and then annually.  Answered pts questions regarding how often she should wear her compression sleeve now that her score is normal but she's had an ALND. Encouraged her to wear her sleeve during times of increased or highly repetitive activity, especially since the weather will be warming up and lymphedema can increase with the warmer weather. She verbalized understanding.   L-DEX FLOWSHEETS - 06/20/23 1000       L-DEX LYMPHEDEMA SCREENING   Measurement Type Unilateral    L-DEX MEASUREMENT EXTREMITY Upper Extremity    POSITION  Standing  DOMINANT SIDE Right    At Risk Side Left    BASELINE SCORE (UNILATERAL) 3    L-DEX SCORE (UNILATERAL) 1.7    VALUE CHANGE (UNILAT) -1.3              Hermenia Bers, PTA 06/20/2023, 10:14 AM

## 2023-08-10 ENCOUNTER — Telehealth: Payer: Self-pay

## 2023-08-10 NOTE — Telephone Encounter (Signed)
Attempted to call pt regarding signatera results lvm for pt to return call back to receive results. Results were negative and signatera will be out in the next 3-6 months to repeat labs.

## 2023-09-19 ENCOUNTER — Ambulatory Visit: Payer: Commercial Managed Care - HMO | Attending: General Surgery

## 2023-09-19 VITALS — Wt 150.5 lb

## 2023-09-19 DIAGNOSIS — Z483 Aftercare following surgery for neoplasm: Secondary | ICD-10-CM | POA: Insufficient documentation

## 2023-09-19 NOTE — Therapy (Signed)
OUTPATIENT PHYSICAL THERAPY SOZO SCREENING NOTE   Patient Name: Sheena Mcdaniel MRN: [id number] DOB:04-Dec-1971, 52 y.o., female Today's Date: 09/19/2023  PCP: No primary care provider on file. REFERRING PROVIDER: Griselda Miner, MD   PT End of Session - 09/19/23 1054     Visit Number 8   # unchanged due to screen only   PT Start Time 1051    PT Stop Time 1056    PT Time Calculation (min) 5 min    Activity Tolerance Patient tolerated treatment well    Behavior During Therapy [facility] for tasks assessed/performed             Past Medical History:  Diagnosis Date   Anxiety    Cancer (HCC) 03/2021   left breast IDC   Port-A-Cath in place 06/18/2021   Past Surgical History:  Procedure Laterality Date   MODIFIED MASTECTOMY Left 01/01/2022   Procedure: LEFT MODIFIED RADICAL MASTECTOMY;  Surgeon: Griselda Miner, MD;  Location: [facility] OR;  Service: General;  Laterality: Left;   PORT-A-CATH REMOVAL Right 07/22/2022   Procedure: REMOVAL PORT-A-CATH;  Surgeon: Griselda Miner, MD;  Location: [facility];  Service: General;  Laterality: Right;   PORTACATH PLACEMENT N/A 05/22/2021   Procedure: INSERTION PORT-A-CATH;  Surgeon: Griselda Miner, MD;  Location: [facility];  Service: General;  Laterality: N/A;   TOTAL MASTECTOMY Right 01/01/2022   Procedure: RIGHT TOTAL MASTECTOMY;  Surgeon: Griselda Miner, MD;  Location: [facility] OR;  Service: General;  Laterality: Right;   WISDOM TOOTH EXTRACTION     Patient Active Problem List   Diagnosis Date Noted   Malignant neoplasm of upper-outer quadrant of left breast in female, estrogen receptor positive (HCC) 05/12/2021   Breast pain, left 04/24/2021   Elevated blood pressure reading 04/24/2021   Perimenopause 04/24/2021    REFERRING DIAG: left breast cancer at risk for lymphedema  THERAPY DIAG: Aftercare following surgery for neoplasm  PERTINENT HISTORY: Dx of left breast cancer with multiple abnormal lymph nodes back in June.  The cancer was a triple negative with a Ki-67 of 60%. She has undergone neoadjuvant chemotherapy and has had a good response. The cancer now measures 2.9 cm and the lymph nodes all looked normal. bil mastectomy 01/01/22 with 11 lymph nodes removed on left all negative. Drains out now. Radiation coming up on 02/22/22  PRECAUTIONS: left UE Lymphedema risk, None  SUBJECTIVE: Pt returns for her 3 month L-Dex screen.  PAIN:  Are you having pain? No  SOZO SCREENING: Patient was assessed today using the SOZO machine to determine the lymphedema index score. This was compared to her baseline score. It was determined that she is within the recommended range when compared to her baseline and no further action is needed at this time. She will continue SOZO screenings. These are done every 3 months for 2 years post operatively followed by every 6 months for 2 years, and then annually.     L-DEX FLOWSHEETS - 09/19/23 1000       L-DEX LYMPHEDEMA SCREENING   Measurement Type Unilateral    L-DEX MEASUREMENT EXTREMITY Upper Extremity    POSITION  Standing    DOMINANT SIDE Right    At Risk Side Left    BASELINE SCORE (UNILATERAL) 3    L-DEX SCORE (UNILATERAL) 2    VALUE CHANGE (UNILAT) -1              Hermenia Bers, PTA 09/19/2023, 10:55 AM

## 2023-10-12 NOTE — Telephone Encounter (Signed)
TC

## 2023-10-29 LAB — SIGNATERA
SIGNATERA MTM READOUT: 0 MTM/ml
SIGNATERA TEST RESULT: NEGATIVE

## 2023-11-04 ENCOUNTER — Telehealth: Payer: Self-pay

## 2023-11-04 NOTE — Telephone Encounter (Signed)
Attempted to call pt. Per Md to give negative signatera results. Unable to reach pt. left VM for pt to call the office back follow the prompts to receive results.

## 2023-12-19 ENCOUNTER — Ambulatory Visit: Payer: Commercial Managed Care - HMO | Attending: General Surgery

## 2023-12-19 VITALS — Wt 150.2 lb

## 2023-12-19 DIAGNOSIS — Z483 Aftercare following surgery for neoplasm: Secondary | ICD-10-CM | POA: Insufficient documentation

## 2023-12-19 NOTE — Therapy (Signed)
OUTPATIENT PHYSICAL THERAPY SOZO SCREENING NOTE   Patient Name: Sheena Mcdaniel MRN: [id number] DOB:[birth year], 52 y.o., female Today's Date: 12/19/2023  PCP: No primary care provider on file. REFERRING PROVIDER: Griselda Miner, MD   PT End of Session - 12/19/23 1049     Visit Number 8   # unchanged due to screen only   PT Start Time 1047    PT Stop Time 1051    PT Time Calculation (min) 4 min    Activity Tolerance Patient tolerated treatment well    Behavior During Therapy [facility] for tasks assessed/performed             Past Medical History:  Diagnosis Date   Anxiety    Cancer (HCC) 03/2021   left breast IDC   Port-A-Cath in place 06/18/2021   Past Surgical History:  Procedure Laterality Date   MODIFIED MASTECTOMY Left 01/01/2022   Procedure: LEFT MODIFIED RADICAL MASTECTOMY;  Surgeon: Griselda Miner, MD;  Location: [facility] OR;  Service: General;  Laterality: Left;   PORT-A-CATH REMOVAL Right 07/22/2022   Procedure: REMOVAL PORT-A-CATH;  Surgeon: Griselda Miner, MD;  Location: [facility];  Service: General;  Laterality: Right;   PORTACATH PLACEMENT N/A 05/22/2021   Procedure: INSERTION PORT-A-CATH;  Surgeon: Griselda Miner, MD;  Location: [facility];  Service: General;  Laterality: N/A;   TOTAL MASTECTOMY Right 01/01/2022   Procedure: RIGHT TOTAL MASTECTOMY;  Surgeon: Griselda Miner, MD;  Location: [facility] OR;  Service: General;  Laterality: Right;   WISDOM TOOTH EXTRACTION     Patient Active Problem List   Diagnosis Date Noted   Malignant neoplasm of upper-outer quadrant of left breast in female, estrogen receptor positive (HCC) 05/12/2021   Breast pain, left 04/24/2021   Elevated blood pressure reading 04/24/2021   Perimenopause 04/24/2021    REFERRING DIAG: left breast cancer at risk for lymphedema  THERAPY DIAG: Aftercare following surgery for neoplasm  PERTINENT HISTORY: Dx of left breast cancer with multiple abnormal lymph nodes back in June.  The cancer was a triple negative with a Ki-67 of 60%. She has undergone neoadjuvant chemotherapy and has had a good response. The cancer now measures 2.9 cm and the lymph nodes all looked normal. bil mastectomy 01/01/22 with 11 lymph nodes removed on left all negative. Drains out now. Radiation coming up on 02/22/22  PRECAUTIONS: left UE Lymphedema risk, None  SUBJECTIVE: Pt returns for her last 3 month L-Dex screen.  PAIN:  Are you having pain? No  SOZO SCREENING: Patient was assessed today using the SOZO machine to determine the lymphedema index score. This was compared to her baseline score. It was determined that she is within the recommended range when compared to her baseline and no further action is needed at this time. She will continue SOZO screenings. These are done every 3 months for 2 years post operatively followed by every 6 months for 2 years, and then annually.     L-DEX FLOWSHEETS - 12/19/23 1000       L-DEX LYMPHEDEMA SCREENING   Measurement Type Unilateral    L-DEX MEASUREMENT EXTREMITY Upper Extremity    POSITION  Standing    DOMINANT SIDE Right    At Risk Side Left    BASELINE SCORE (UNILATERAL) 3    L-DEX SCORE (UNILATERAL) 2.2    VALUE CHANGE (UNILAT) -0.8            P: Begin 6 month screens.  Hermenia Bers, PTA 12/19/2023,  10:50 AM

## 2023-12-20 ENCOUNTER — Encounter: Payer: Self-pay | Admitting: Plastic Surgery

## 2023-12-20 ENCOUNTER — Ambulatory Visit (INDEPENDENT_AMBULATORY_CARE_PROVIDER_SITE_OTHER): Payer: Commercial Managed Care - HMO | Admitting: Plastic Surgery

## 2023-12-20 DIAGNOSIS — L989 Disorder of the skin and subcutaneous tissue, unspecified: Secondary | ICD-10-CM

## 2023-12-20 NOTE — Progress Notes (Signed)
     Patient ID: Sheena Mcdaniel, female    DOB: 01-21-1971, 52 y.o.   MRN: [id number]   Chief Complaint  Patient presents with   Advice Only    The patient is a 52 year old female here for evaluation of a lesion on her left upper lip.  It has been there for years and seems to be getting a little bit darker from previously.  The size has been pretty consistent.  She has had things removed in the past.  She has a history of breast cancer and was treated with mastectomy about 2 years ago.  She is otherwise in good health and no other skin lesions of concern.  The 1 on the left upper lip is flesh color with some darkness in it and it is about 7 mm in size.    Review of Systems  Constitutional: Negative.   HENT: Negative.    Eyes: Negative.   Respiratory: Negative.  Negative for chest tightness.   Cardiovascular: Negative.   Gastrointestinal: Negative.   Endocrine: Negative.   Genitourinary: Negative.     Past Medical History:  Diagnosis Date   Anxiety    Cancer (HCC) 03/2021   left breast IDC   Port-A-Cath in place 06/18/2021    Past Surgical History:  Procedure Laterality Date   MODIFIED MASTECTOMY Left 01/01/2022   Procedure: LEFT MODIFIED RADICAL MASTECTOMY;  Surgeon: Griselda Miner, MD;  Location: [facility] OR;  Service: General;  Laterality: Left;   PORT-A-CATH REMOVAL Right 07/22/2022   Procedure: REMOVAL PORT-A-CATH;  Surgeon: Griselda Miner, MD;  Location: [facility];  Service: General;  Laterality: Right;   PORTACATH PLACEMENT N/A 05/22/2021   Procedure: INSERTION PORT-A-CATH;  Surgeon: Griselda Miner, MD;  Location: [facility];  Service: General;  Laterality: N/A;   TOTAL MASTECTOMY Right 01/01/2022   Procedure: RIGHT TOTAL MASTECTOMY;  Surgeon: Griselda Miner, MD;  Location: [facility] OR;  Service: General;  Laterality: Right;   WISDOM TOOTH EXTRACTION       No current outpatient medications on file.   Objective:   There were no vitals filed for this  visit.  Physical Exam Constitutional:      Appearance: Normal appearance.  HENT:     Head: Atraumatic.   Cardiovascular:     Rate and Rhythm: Normal rate.  Skin:    General: Skin is warm.     Capillary Refill: Capillary refill takes less than 2 seconds.  Neurological:     Mental Status: She is alert.  Psychiatric:        Mood and Affect: Mood normal.        Behavior: Behavior normal.        Thought Content: Thought content normal.        Judgment: Judgment normal.     Assessment & Plan:  Changing skin lesion  Plan for excision of changing skin lesion left upper lip.  Pictures were obtained of the patient and placed in the chart with the patient's or guardian's permission.   Alena Bills Tsugio Elison, DO

## 2023-12-29 ENCOUNTER — Telehealth: Payer: Self-pay | Admitting: Plastic Surgery

## 2023-12-29 NOTE — Telephone Encounter (Signed)
 called lvmail to r/s proc from 01-03-23, Dr D has a sx and px will have to be r/s, DrD has 01-12-23 at 145pm if they work please document when she calls back, otherwise we can r/s for another day.

## 2024-01-04 ENCOUNTER — Ambulatory Visit: Payer: Commercial Managed Care - HMO | Admitting: Plastic Surgery

## 2024-01-13 ENCOUNTER — Ambulatory Visit (INDEPENDENT_AMBULATORY_CARE_PROVIDER_SITE_OTHER): Payer: Self-pay | Admitting: Plastic Surgery

## 2024-01-13 DIAGNOSIS — L989 Disorder of the skin and subcutaneous tissue, unspecified: Secondary | ICD-10-CM

## 2024-01-13 NOTE — Progress Notes (Signed)
Procedure Note  Preoperative Dx: changing skin lesion of upper lip  Postoperative Dx: Same  Procedure: Excision of changing skin lesion of upper lip 9 mm  Anesthesia: Lidocaine 1% with 1:100,000 epinephrine   Description of Procedure: Risks and complications were explained to the patient.  Consent was confirmed and the patient understands the risks and benefits.  The potential complications and alternatives were explained and the patient consents.  The patient expressed understanding the option of not having the procedure and the risks of a scar.  Time out was called and all information was confirmed to be correct.    The area was prepped and drapped.  Lidocaine 1% with epinephrine was injected in the subcutaneous area.  After waiting several minutes for the local to take affect a #15 blade was used to excise the area in an eliptical pattern. The skin edges were reapproximated with 6-0 Monocryl subcuticular running closure.  A dressing was applied.  The patient was given instructions on how to care for the area and a follow up appointment.  Sheena Mcdaniel tolerated the procedure well and there were no complications. Agreed not to send to path.

## 2024-01-16 ENCOUNTER — Telehealth: Payer: Self-pay

## 2024-01-16 NOTE — Telephone Encounter (Signed)
Called the patient to check in post procedure (01/13/24). I left a voicemail stating she can call us with concerns or questions and if there are none, we will see her at her follow-up appointment.

## 2024-01-19 ENCOUNTER — Ambulatory Visit: Payer: Commercial Managed Care - HMO | Admitting: Surgical

## 2024-01-19 VITALS — BP 135/86 | HR 74 | Ht 64.0 in | Wt 149.0 lb

## 2024-01-19 DIAGNOSIS — L989 Disorder of the skin and subcutaneous tissue, unspecified: Secondary | ICD-10-CM

## 2024-01-19 NOTE — Progress Notes (Signed)
53 year old female underwent excision of changing skin lesion of upper lip with Dr. Ulice Bold on 01/13/2024.  The area was closed with Monocryl.  This appeared to be in a simple interrupted like pattern, there was Dermabond over the incision.  This was well adherent to the patient's upper lip, this was carefully removed on exam today along with the sutures.  After removal of the sutures, she had 2 abrasion/wound creases from where the interrupted sutures were placed.  There is no surrounding erythema.  The vertical incision is intact and healing well  A/P:  Recommend thin amount of Vaseline to the lip incision 1-2 times daily to assist with healing.  Discussed with patient that this will likely heal over the next week.  Recommend sunscreen when exposed to the sun to prevent hyperpigmentation.  Recommend following up as needed, calling with questions or concerns.  No signs infection or concern on exam.  Pictures were obtained of the patient and placed in the chart with the patient's or guardian's permission.

## 2024-01-28 LAB — SIGNATERA
SIGNATERA MTM READOUT: 0 MTM/ml
SIGNATERA TEST RESULT: NEGATIVE

## 2024-02-01 ENCOUNTER — Telehealth: Payer: Self-pay

## 2024-02-01 NOTE — Telephone Encounter (Signed)
Called pt per Md to inform her about her negative Signatera results. Lvm for pt to return call back to receive results.

## 2024-04-26 ENCOUNTER — Inpatient Hospital Stay: Payer: Commercial Managed Care - HMO | Attending: Hematology and Oncology | Admitting: Hematology and Oncology

## 2024-04-26 VITALS — BP 137/71 | HR 81 | Temp 98.5°F | Resp 18 | Ht 64.0 in | Wt 155.3 lb

## 2024-04-26 DIAGNOSIS — C50412 Malignant neoplasm of upper-outer quadrant of left female breast: Secondary | ICD-10-CM

## 2024-04-26 DIAGNOSIS — Z9221 Personal history of antineoplastic chemotherapy: Secondary | ICD-10-CM | POA: Insufficient documentation

## 2024-04-26 DIAGNOSIS — Z853 Personal history of malignant neoplasm of breast: Secondary | ICD-10-CM | POA: Insufficient documentation

## 2024-04-26 DIAGNOSIS — N951 Menopausal and female climacteric states: Secondary | ICD-10-CM | POA: Insufficient documentation

## 2024-04-26 DIAGNOSIS — Z170 Estrogen receptor positive status [ER+]: Secondary | ICD-10-CM | POA: Diagnosis not present

## 2024-04-26 DIAGNOSIS — Z923 Personal history of irradiation: Secondary | ICD-10-CM | POA: Diagnosis not present

## 2024-04-26 DIAGNOSIS — Z9013 Acquired absence of bilateral breasts and nipples: Secondary | ICD-10-CM | POA: Insufficient documentation

## 2024-04-26 NOTE — Assessment & Plan Note (Signed)
 05/06/2021: Large circumscribed multicystic mass left breast 9 to 10 cm with multiple left axillary lymph nodes with cortical thickening: biopsy 3 o'clock position: Grade 2 IDC, 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  Neoadjuvant chemotherapy with Adriamycin  and Cytoxan  along with pembrolizumab  followed by Taxol  carboplatin  and pembrolizumab  completed 11/27/2021 (pembrolizumab  maintenance for 1 year) completed 05/27/2022 2. 01/01/2022 bilateral Mastectomies: No residual cancer, 0/11 LN neg 3.  Adjuvant radiation 02/23/2022-04/07/2022 4.  Followed by adjuvant antiestrogen therapy (since she is 10% ER positive): After discussing pros and cons of antiestrogen therapy we decided to not use it because of very minimal benefit ---------------------------------------------------------------------------------------------------------------------------------------------- Breast cancer surveillance: 1.  No role of imaging since she had bilateral mastectomies 2. 01/28/2024 Signatera testing: Negative 3.  Chest wall examination: No palpable lumps or nodules of concern.     Survivorship: She walks 4-5 miles every day at a very brisk pace. Genetic testing will be ordered. Return to clinic in 1 year for follow-up

## 2024-04-26 NOTE — Progress Notes (Signed)
 Patient Care Team: Ransom Byers, MD as PCP - General (Family Medicine) Cameron Cea, MD as Consulting Physician (Hematology and Oncology) Johna Myers, MD as Consulting Physician (Radiation Oncology) Caralyn Chandler, MD as Consulting Physician (General Surgery)  DIAGNOSIS:  Encounter Diagnosis  Name Primary?   Malignant neoplasm of upper-outer quadrant of left breast in female, estrogen receptor positive (HCC) Yes    SUMMARY OF ONCOLOGIC HISTORY: Oncology History  Malignant neoplasm of upper-outer quadrant of left breast in female, estrogen receptor positive (HCC)  05/06/2021 Initial Diagnosis   Large circumscribed multicystic mass left breast 9 to 10 cm with multiple left axillary lymph nodes with cortical thickening: biopsy 3 o'clock position: Grade 2 IDC, ER 10%, PR 0%, Ki-67 60%, HER2 negative Right breast: 0.4 cm mass at 9:30 position: Benign on biopsy   05/12/2021 Cancer Staging   Staging form: Breast, AJCC 8th Edition - Clinical stage from 05/12/2021: Stage IIIB (cT3, cN1, cM0, G2, ER-, PR-, HER2-) - Signed by Cameron Cea, MD on 05/12/2021 Stage prefix: Initial diagnosis Histologic grading system: 3 grade system   05/30/2021 - 11/27/2021 Chemotherapy   Patient is on Treatment Plan : BREAST Pembrolizumab  + AC q21d x 4 cycles followed by Pembrolizumab  + Carboplatin  D1 + Paclitaxel  D1,8,15 q21d X 4 cycles       Surgery   Bilateral mastectomies: No residual cancer 0/11 LN Neg   01/21/2022 - 05/28/2022 Chemotherapy   Patient is on Treatment Plan : BREAST Pembrolizumab  (200) q21d x 27 weeks     02/22/2022 - 04/07/2022 Radiation Therapy   Site Technique Total Dose (Gy) Dose per Fx (Gy) Completed Fx Beam Energies  Chest Wall, Left: CW_L 3D 50.4/50.4 1.8 28/28 6X  Chest Wall, Left: CW_L_SCLV 3D 50.4/50.4 1.8 28/28 6X, 10X  Chest Wall, Left: CW_L_Bst Electron 10/10 2 5/5 6E       CHIEF COMPLIANT: Surveillance of breast cancer  HISTORY OF PRESENT ILLNESS:   History of  Present Illness Sheena Mcdaniel is a 53 year old female who presents with fluid buildup and morning stiffness. She is accompanied by her partner, Athena Bland.  She experiences fluid buildup, which worsens with heat, and uses a compression sleeve for relief. Morning stiffness in her fingers began a month ago, improving after hydration. Stiffness also affects other joints, such as when descending stairs. There is a family history of arthritis, but she has no diagnosis of arthritis herself. She maintains an active lifestyle, walking four to five miles daily and engaging in [place] and attending concerts. No additional symptoms or concerns are reported.     ALLERGIES:  has no known allergies.  MEDICATIONS:  No current outpatient medications on file.   No current facility-administered medications for this visit.    PHYSICAL EXAMINATION: ECOG PERFORMANCE STATUS: 1 - Symptomatic but completely ambulatory  Vitals:   04/26/24 1103  BP: 137/71  Pulse: 81  Resp: 18  Temp: 98.5 F (36.9 C)  SpO2: 100%   Filed Weights   04/26/24 1103  Weight: 155 lb 4.8 oz (70.4 kg)      LABORATORY DATA:  I have reviewed the data as listed    Latest Ref Rng & Units 05/28/2022    9:59 AM 05/07/2022    9:13 AM 04/16/2022    9:34 AM  CMP  Glucose 70 - 99 mg/dL 409  811  97   BUN 6 - 20 mg/dL 14  12  13    Creatinine 0.44 - 1.00 mg/dL 9.14  7.82  9.56  Sodium 135 - 145 mmol/L 139  140  142   Potassium 3.5 - 5.1 mmol/L 4.0  3.9  3.9   Chloride 98 - 111 mmol/L 106  107  107   CO2 22 - 32 mmol/L 29  29  29    Calcium 8.9 - 10.3 mg/dL 9.0  8.9  9.0   Total Protein 6.5 - 8.1 g/dL 5.3  5.5  5.9   Total Bilirubin 0.3 - 1.2 mg/dL 0.3  0.5  0.4   Alkaline Phos 38 - 126 U/L 61  64  64   AST 15 - 41 U/L 24  25  19    ALT 0 - 44 U/L 15  18  13      Lab Results  Component Value Date   WBC 14.7 (H) 05/28/2022   HGB 10.5 (L) 05/28/2022   HCT 32.6 (L) 05/28/2022   MCV 84.0 05/28/2022   PLT 301 05/28/2022   NEUTROABS  2.6 05/28/2022    ASSESSMENT & PLAN:  Malignant neoplasm of upper-outer quadrant of left breast in female, estrogen receptor positive (HCC) 05/06/2021: Large circumscribed multicystic mass left breast 9 to 10 cm with multiple left axillary lymph nodes with cortical thickening: biopsy 3 o'clock position: Grade 2 IDC, 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  Neoadjuvant chemotherapy with Adriamycin  and Cytoxan  along with pembrolizumab  followed by Taxol  carboplatin  and pembrolizumab  completed 11/27/2021 (pembrolizumab  maintenance for 1 year) completed 05/27/2022 2. 01/01/2022 bilateral Mastectomies: No residual cancer, 0/11 LN neg 3.  Adjuvant radiation 02/23/2022-04/07/2022 4.  Followed by adjuvant antiestrogen therapy (since she is 10% ER positive): After discussing pros and cons of antiestrogen therapy we decided to not use it because of very minimal benefit ---------------------------------------------------------------------------------------------------------------------------------------------- Breast cancer surveillance: 1.  No role of imaging since she had bilateral mastectomies 2. 01/28/2024 Signatera testing: Negative 3.  Chest wall examination: No palpable lumps or nodules of concern.     Survivorship: She walks 4-5 miles every day at a very brisk pace. Genetic testing will be ordered. Return to clinic in 1 year for follow-up ------------------------------------- Assessment and Plan Assessment & Plan Menopausal symptoms Symptoms likely due to estrogen deficiency associated with menopause, not arthritis. - Advised lifestyle modifications to alleviate stiffness. - Encouraged regular movement and application of warmth for symptom relief.      No orders of the defined types were placed in this encounter.  The patient has a good understanding of the overall plan. she agrees with it. she will call with any  problems that may develop before the next visit here. Total time spent: 30 mins including face to face time and time spent for planning, charting and co-ordination of care   Viinay K Desjuan Stearns, MD 04/26/24

## 2024-06-18 ENCOUNTER — Ambulatory Visit: Payer: Commercial Managed Care - HMO | Attending: General Surgery

## 2024-06-18 VITALS — Wt 154.2 lb

## 2024-06-18 DIAGNOSIS — Z483 Aftercare following surgery for neoplasm: Secondary | ICD-10-CM | POA: Insufficient documentation

## 2024-06-18 NOTE — Therapy (Signed)
 OUTPATIENT PHYSICAL THERAPY SOZO SCREENING NOTE   Patient Name: Sheena Mcdaniel MRN: [id number] DOB:Nov 13, 1971, 53 y.o., female Today's Date: 06/18/2024  PCP: Chrystal Lamarr RAMAN, MD REFERRING PROVIDER: Curvin Deward MOULD, MD   PT End of Session - 06/18/24 1506     Visit Number 8   # unchanged due to screen only   PT Start Time 1504    PT Stop Time 1508    PT Time Calculation (min) 4 min    Activity Tolerance Patient tolerated treatment well    Behavior During Therapy [facility] for tasks assessed/performed          Past Medical History:  Diagnosis Date   Anxiety    Cancer (HCC) 03/2021   left breast IDC   Port-A-Cath in place 06/18/2021   Past Surgical History:  Procedure Laterality Date   MODIFIED MASTECTOMY Left 01/01/2022   Procedure: LEFT MODIFIED RADICAL MASTECTOMY;  Surgeon: Curvin Deward MOULD, MD;  Location: [facility] OR;  Service: General;  Laterality: Left;   PORT-A-CATH REMOVAL Right 07/22/2022   Procedure: REMOVAL PORT-A-CATH;  Surgeon: Curvin Deward MOULD, MD;  Location: [facility];  Service: General;  Laterality: Right;   PORTACATH PLACEMENT N/A 05/22/2021   Procedure: INSERTION PORT-A-CATH;  Surgeon: Curvin Deward MOULD, MD;  Location: [facility];  Service: General;  Laterality: N/A;   TOTAL MASTECTOMY Right 01/01/2022   Procedure: RIGHT TOTAL MASTECTOMY;  Surgeon: Curvin Deward MOULD, MD;  Location: [facility] OR;  Service: General;  Laterality: Right;   WISDOM TOOTH EXTRACTION     Patient Active Problem List   Diagnosis Date Noted   Changing skin lesion 12/20/2023   Malignant neoplasm of upper-outer quadrant of left breast in female, estrogen receptor positive (HCC) 05/12/2021   Breast pain, left 04/24/2021   Elevated blood pressure reading 04/24/2021   Perimenopause 04/24/2021    REFERRING DIAG: left breast cancer at risk for lymphedema  THERAPY DIAG: Aftercare following surgery for neoplasm  PERTINENT HISTORY: Dx of left breast cancer with multiple abnormal  lymph nodes back in June. The cancer was a triple negative with a Ki-67 of 60%. She has undergone neoadjuvant chemotherapy and has had a good response. The cancer now measures 2.9 cm and the lymph nodes all looked normal. bil mastectomy 01/01/22 with 11 lymph nodes removed on left all negative. Drains out now. Radiation coming up on 02/22/22  PRECAUTIONS: left UE Lymphedema risk, None  SUBJECTIVE: Pt returns for her first 6 month L-Dex screen.  PAIN:  Are you having pain? No  SOZO SCREENING: Patient was assessed today using the SOZO machine to determine the lymphedema index score. This was compared to her baseline score. It was determined that she is within the recommended range when compared to her baseline and no further action is needed at this time. She will continue SOZO screenings. These are done every 3 months for 2 years post operatively followed by every 6 months for 2 years, and then annually.     L-DEX FLOWSHEETS - 06/18/24 1500       L-DEX LYMPHEDEMA SCREENING   Measurement Type Unilateral    L-DEX MEASUREMENT EXTREMITY Upper Extremity    POSITION  Standing    DOMINANT SIDE Right    At Risk Side Left    BASELINE SCORE (UNILATERAL) 3    L-DEX SCORE (UNILATERAL) 4    VALUE CHANGE (UNILAT) 1         P: Cont 6 month screens.  Aden Berwyn Caldron, PTA 06/18/2024, 3:07 PM

## 2024-06-27 ENCOUNTER — Other Ambulatory Visit: Payer: Self-pay

## 2024-06-27 DIAGNOSIS — C50412 Malignant neoplasm of upper-outer quadrant of left female breast: Secondary | ICD-10-CM

## 2024-07-15 LAB — SIGNATERA ONLY (NATERA MANAGED)
SIGNATERA MTM READOUT: 0 MTM/ml
SIGNATERA TEST RESULT: NEGATIVE

## 2024-12-08 ENCOUNTER — Emergency Department (HOSPITAL_BASED_OUTPATIENT_CLINIC_OR_DEPARTMENT_OTHER)
Admission: EM | Admit: 2024-12-08 | Discharge: 2024-12-08 | Disposition: A | Discharge status: Home / Self Care | Attending: Emergency Medicine | Admitting: Emergency Medicine

## 2024-12-08 ENCOUNTER — Encounter (HOSPITAL_BASED_OUTPATIENT_CLINIC_OR_DEPARTMENT_OTHER): Payer: Self-pay | Admitting: Emergency Medicine

## 2024-12-08 ENCOUNTER — Emergency Department (HOSPITAL_BASED_OUTPATIENT_CLINIC_OR_DEPARTMENT_OTHER): Admitting: Radiology

## 2024-12-08 DIAGNOSIS — J209 Acute bronchitis, unspecified: Secondary | ICD-10-CM | POA: Insufficient documentation

## 2024-12-08 LAB — CBC
HCT: 39.8 % (ref 36.0–46.0)
Hemoglobin: 13.5 g/dL (ref 12.0–15.0)
MCH: 29.2 pg (ref 26.0–34.0)
MCHC: 33.9 g/dL (ref 30.0–36.0)
MCV: 86 fL (ref 80.0–100.0)
Platelets: 300 K/uL (ref 150–400)
RBC: 4.63 MIL/uL (ref 3.87–5.11)
RDW: 12.9 % (ref 11.5–15.5)
WBC: 13.1 K/uL — ABNORMAL HIGH (ref 4.0–10.5)
nRBC: 0 % (ref 0.0–0.2)

## 2024-12-08 LAB — RESP PANEL BY RT-PCR (RSV, FLU A&B, COVID)  RVPGX2
Influenza A by PCR: NEGATIVE
Influenza B by PCR: NEGATIVE
Resp Syncytial Virus by PCR: NEGATIVE
SARS Coronavirus 2 by RT PCR: NEGATIVE

## 2024-12-08 LAB — BASIC METABOLIC PANEL WITH GFR
Anion gap: 18 — ABNORMAL HIGH (ref 5–15)
BUN: 11 mg/dL (ref 6–20)
CO2: 22 mmol/L (ref 22–32)
Calcium: 10.2 mg/dL (ref 8.9–10.3)
Chloride: 94 mmol/L — ABNORMAL LOW (ref 98–111)
Creatinine, Ser: 0.71 mg/dL (ref 0.44–1.00)
GFR, Estimated: >60 mL/min (ref >60)
Glucose, Bld: 206 mg/dL — ABNORMAL HIGH (ref 70–99)
Potassium: 3.1 mmol/L — ABNORMAL LOW (ref 3.5–5.1)
Sodium: 134 mmol/L — ABNORMAL LOW (ref 135–145)

## 2024-12-08 MED ORDER — ALBUTEROL SULFATE HFA 108 (90 BASE) MCG/ACT IN AERS: 1.0000 | INHALATION_SPRAY | Freq: Four times a day (QID) | RESPIRATORY_TRACT | 0 refills | Status: AC | PRN

## 2024-12-08 MED ORDER — BENZONATATE 100 MG PO CAPS: 100.0000 mg | ORAL_CAPSULE | Freq: Three times a day (TID) | ORAL | 0 refills | Status: AC

## 2024-12-08 MED ORDER — POTASSIUM CHLORIDE CRYS ER 20 MEQ PO TBCR
40.0000 meq | EXTENDED_RELEASE_TABLET | Freq: Once | ORAL | Status: AC
  Administered 2024-12-08: 40 meq via ORAL
  Filled 2024-12-08: qty 2

## 2024-12-08 NOTE — ED Triage Notes (Signed)
 BIB [facility] from home. Reports [facility] with flu like symptoms. Husband was sick prior. Given breathing treatment by EMS en route.

## 2024-12-08 NOTE — ED Provider Notes (Signed)
  EMERGENCY DEPARTMENT AT [facility] Provider Note   CSN: [id number] Arrival date & time: 12/08/24  1322     Patient presents with: Shortness of Breath   Sheena Mcdaniel is a 53 y.o. female who was brought into the ED by EMS for acute shortness of breath has been going on for the last 2 days.  States that that is abruptly worse this morning with heavy mucus in the back of the throat that she felt that she was unable to clear as well as noticed that she had wheezing with inspiration, felt generally unwell.  Denies have any subjective fevers, did have some nausea with vomiting upon presentation to the ED today however states that since she has had an albuterol  treatment by EMS and has been able to clear some of the mucus that she has noticed in her throat she feels subjectively better.  Only previous medical history is of breast cancer, previous mastectomy 2023 and is not currently taking any medications for same.  Does state that her husband was recently ill with a upper respiratory virus, uncertain etiology.    Shortness of Breath Associated symptoms: cough and wheezing        Prior to Admission medications  Medication Sig Start Date End Date Taking? Authorizing Provider  albuterol  (VENTOLIN  HFA) 108 (90 Base) MCG/ACT inhaler Inhale 1-2 puffs into the lungs every 6 (six) hours as needed for wheezing or shortness of breath. 12/08/24  Yes Myriam Dorn BROCKS, PA  benzonatate  (TESSALON ) 100 MG capsule Take 1 capsule (100 mg total) by mouth every 8 (eight) hours. 12/08/24  Yes Myriam Dorn BROCKS, PA    Allergies: Patient has no known allergies.    Review of Systems  HENT:  Positive for congestion.   Respiratory:  Positive for cough, shortness of breath and wheezing.   All other systems reviewed and are negative.   Updated Vital Signs BP 130/67 (BP Location: Right Arm)   Pulse (!) 117   Temp 98.1 F (36.7 C) (Oral)   Resp 17   LMP 02/24/2021   SpO2 96%    Physical Exam Vitals and nursing note reviewed.  Constitutional:      General: She is not in acute distress.    Appearance: Normal appearance. She is well-developed and normal weight.  HENT:     Head: Normocephalic and atraumatic.     Mouth/Throat:     Mouth: Mucous membranes are moist.     Pharynx: Oropharynx is clear. Uvula midline. Postnasal drip present. No pharyngeal swelling or oropharyngeal exudate.     Tonsils: No tonsillar exudate or tonsillar abscesses.     Comments: Posterior oropharynx is notable for postnasal drip, otherwise no remarkable findings. Eyes:     Extraocular Movements: Extraocular movements intact.     Conjunctiva/sclera: Conjunctivae normal.     Pupils: Pupils are equal, round, and reactive to light.  Neck:     Thyroid : No thyromegaly.  Cardiovascular:     Rate and Rhythm: Normal rate and regular rhythm.     Pulses: Normal pulses.     Heart sounds: Normal heart sounds. No murmur heard.    No friction rub. No gallop.  Pulmonary:     Effort: Pulmonary effort is normal. No respiratory distress.     Breath sounds: Examination of the right-middle field reveals wheezing. Examination of the left-middle field reveals wheezing. Examination of the right-lower field reveals wheezing. Examination of the left-lower field reveals wheezing. Wheezing present.     Comments: Mild  expiratory wheeze noted throughout the bilateral bases as well as mid fields. Abdominal:     General: Abdomen is flat. Bowel sounds are normal.     Palpations: Abdomen is soft.  Musculoskeletal:        General: Normal range of motion.     Cervical back: Normal range of motion and neck supple.     Right lower leg: No edema.     Left lower leg: No edema.  Lymphadenopathy:     Cervical: No cervical adenopathy.  Skin:    General: Skin is warm and dry.     Capillary Refill: Capillary refill takes less than 2 seconds.  Neurological:     General: No focal deficit present.     Mental Status: She  is alert. Mental status is at baseline.  Psychiatric:        Mood and Affect: Mood normal.     (all labs ordered are listed, but only abnormal results are displayed) Labs Reviewed  BASIC METABOLIC PANEL WITH GFR - Abnormal; Notable for the following components:      Result Value   Sodium 134 (*)    Potassium 3.1 (*)    Chloride 94 (*)    Glucose, Bld 206 (*)    Anion gap 18 (*)    All other components within normal limits  CBC - Abnormal; Notable for the following components:   WBC 13.1 (*)    All other components within normal limits  RESP PANEL BY RT-PCR (RSV, FLU A&B, COVID)  [id number]    EKG: None  Radiology: DG Chest 2 View Result Date: 12/08/2024 EXAM: 2 VIEW(S) XRAY OF THE CHEST 12/08/2024 01:53:44 PM COMPARISON: None available. CLINICAL HISTORY: Cough. FINDINGS: LUNGS AND PLEURA: No focal pulmonary opacity. No pleural effusion. No pneumothorax. HEART AND MEDIASTINUM: No acute abnormality of the cardiac and mediastinal silhouettes. BONES AND SOFT TISSUES: Bilateral chest wall surgical clips. No acute osseous abnormality. IMPRESSION: 1. No acute cardiopulmonary process. Electronically signed by: Morgane Naveau MD 12/08/2024 02:30 PM EST RP Workstation: [id number]     Procedures   Medications Ordered in the ED  potassium chloride  SA (KLOR-CON  M) CR tablet 40 mEq (has no administration in time range)                                    Medical Decision Making Amount and/or Complexity of Data Reviewed Labs: ordered. Radiology: ordered.  Risk Prescription drug management.   Medical Decision Making:   Sheena Mcdaniel is a 53 y.o. female who presented to the ED today with shortness of breath detailed above.    Patient placed on continuous vitals and telemetry monitoring while in ED which was reviewed periodically.  Complete initial physical exam performed, notably the patient  was alert and oriented in no apparent distress.  She does have moderate wheezes throughout  the bilateral bases and mid fields however there is good air entry into all lung fields.  No peripheral edema noted.    Reviewed and confirmed nursing documentation for past medical history, family history, social history.    Initial Assessment:   With the patient's presentation of shortness of breath, differential includes pneumonia, bronchitis/bronchiolitis, pneumothorax, pulmonary embolus, CHF, COPD.  She does not have any peripheral edema to suggest cardiac etiology and is had no chest pain, symptoms relieved with bronchodilators and route by EMS.  As such believe is more suggestive of a viral or bacterial  illness causing pneumonia and/or bronchitis/bronchiolitis.  Initial Plan:   Screening labs including CBC and Metabolic panel to evaluate for infectious or metabolic etiology of disease.  Obtain nasopharyngeal swab for respiratory panel CXR to evaluate for structural/infectious intrathoracic pathology.  EKG to evaluate for cardiac pathology Objective evaluation as below reviewed   Initial Study Results:   Laboratory  All laboratory results reviewed without evidence of clinically relevant pathology.   Exceptions include: Mild leukocytosis at 13.1.  Hypokalemia 3.1.  EKG EKG was reviewed independently. Rate, rhythm, axis, intervals all examined and without medically relevant abnormality. ST segments without concerns for elevations.    Radiology:  All images reviewed independently. Agree with radiology report at this time.   DG Chest 2 View Result Date: 12/08/2024 EXAM: 2 VIEW(S) XRAY OF THE CHEST 12/08/2024 01:53:44 PM COMPARISON: None available. CLINICAL HISTORY: Cough. FINDINGS: LUNGS AND PLEURA: No focal pulmonary opacity. No pleural effusion. No pneumothorax. HEART AND MEDIASTINUM: No acute abnormality of the cardiac and mediastinal silhouettes. BONES AND SOFT TISSUES: Bilateral chest wall surgical clips. No acute osseous abnormality. IMPRESSION: 1. No acute cardiopulmonary process.  Electronically signed by: Morgane Naveau MD 12/08/2024 02:30 PM EST RP Workstation: [id number]    Reassessment and Plan:   Given the finding of mild hypokalemia on evaluation will provide oral replacement with 40 mEq of potassium p.o.  Respiratory panel viral swab is negative for COVID/flu/RSV.  Chest x-ray does not show any acute pneumonia, though with wheezing noted on exam believe this consistent with a acute bronchitis/bronchiolitis with viral etiology.  As such we will manage her persistent cough with bronchodilators, specifically albuterol  inhaler as needed every 6 hours.  Encouraged her to follow-up with her primary care within the next 2 weeks for reimaging of the chest to ensure resolution as well as for lab redraw to ensure that her potassium is stabilized and there is no need for chronic potassium replacement.  She understands agrees has no further concerns at this time.  As she has improved substantially with care provided, has no concerning findings on the exam or workup, will discharge to outpatient follow-up as previously discussed.       Final diagnoses:  Acute bronchitis, unspecified organism    ED Discharge Orders          Ordered    albuterol  (VENTOLIN  HFA) 108 (90 Base) MCG/ACT inhaler  Every 6 hours PRN        12/08/24 1521    benzonatate  (TESSALON ) 100 MG capsule  Every 8 hours        12/08/24 1522               Myriam Dorn BROCKS, PA 12/08/24 1522    Emil Share, DO 12/09/24 [phone]

## 2024-12-17 ENCOUNTER — Ambulatory Visit: Attending: General Surgery

## 2024-12-17 VITALS — Wt 154.0 lb

## 2024-12-17 DIAGNOSIS — Z483 Aftercare following surgery for neoplasm: Secondary | ICD-10-CM | POA: Insufficient documentation

## 2024-12-17 NOTE — Therapy (Signed)
 " OUTPATIENT PHYSICAL THERAPY SOZO SCREENING NOTE   Patient Name: Sheena Mcdaniel MRN: [id number] DOB:01/21/1971, 53 y.o., female Today's Date: 12/17/2024  PCP: Chrystal Lamarr RAMAN, MD REFERRING PROVIDER: Curvin Deward MOULD, MD   PT End of Session - 12/17/24 1510     Visit Number 8   # unchanged due to screen only   PT Start Time 1508    PT Stop Time 1512    PT Time Calculation (min) 4 min    Activity Tolerance Patient tolerated treatment well    Behavior During Therapy [facility] for tasks assessed/performed          Past Medical History:  Diagnosis Date   Anxiety    Cancer (HCC) 03/2021   left breast IDC   Port-A-Cath in place 06/18/2021   Past Surgical History:  Procedure Laterality Date   MODIFIED MASTECTOMY Left 01/01/2022   Procedure: LEFT MODIFIED RADICAL MASTECTOMY;  Surgeon: Curvin Deward MOULD, MD;  Location: [facility] OR;  Service: General;  Laterality: Left;   PORT-A-CATH REMOVAL Right 07/22/2022   Procedure: REMOVAL PORT-A-CATH;  Surgeon: Curvin Deward MOULD, MD;  Location: [facility];  Service: General;  Laterality: Right;   PORTACATH PLACEMENT N/A 05/22/2021   Procedure: INSERTION PORT-A-CATH;  Surgeon: Curvin Deward MOULD, MD;  Location: [facility];  Service: General;  Laterality: N/A;   TOTAL MASTECTOMY Right 01/01/2022   Procedure: RIGHT TOTAL MASTECTOMY;  Surgeon: Curvin Deward MOULD, MD;  Location: [facility] OR;  Service: General;  Laterality: Right;   WISDOM TOOTH EXTRACTION     Patient Active Problem List   Diagnosis Date Noted   Changing skin lesion 12/20/2023   Malignant neoplasm of upper-outer quadrant of left breast in female, estrogen receptor positive (HCC) 05/12/2021   Breast pain, left 04/24/2021   Elevated blood pressure reading 04/24/2021   Perimenopause 04/24/2021    REFERRING DIAG: left breast cancer at risk for lymphedema  THERAPY DIAG: Aftercare following surgery for neoplasm  PERTINENT HISTORY: Dx of left breast cancer with multiple abnormal  lymph nodes back in June. The cancer was a triple negative with a Ki-67 of 60%. She has undergone neoadjuvant chemotherapy and has had a good response. The cancer now measures 2.9 cm and the lymph nodes all looked normal. bil mastectomy 01/01/22 with 11 lymph nodes removed on left all negative. Drains out now. Radiation coming up on 02/22/22  PRECAUTIONS: left UE Lymphedema risk, None  SUBJECTIVE: Pt returns for her 6 month L-Dex screen.  PAIN:  Are you having pain? No  SOZO SCREENING: Patient was assessed today using the SOZO machine to determine the lymphedema index score. This was compared to her baseline score. It was determined that she is within the recommended range when compared to her baseline and no further action is needed at this time. She will continue SOZO screenings. These are done every 3 months for 2 years post operatively followed by every 6 months for 2 years, and then annually.     L-DEX FLOWSHEETS - 12/17/24 1500       L-DEX LYMPHEDEMA SCREENING   Measurement Type Unilateral    L-DEX MEASUREMENT EXTREMITY Upper Extremity    POSITION  Standing    DOMINANT SIDE Right    At Risk Side Left    BASELINE SCORE (UNILATERAL) 3    L-DEX SCORE (UNILATERAL) 2.4    VALUE CHANGE (UNILAT) -0.6         P: Cont 6 month screens until 12/2025.  Aden Berwyn Caldron, PTA 12/17/2024,  3:11 PM    "

## 2025-01-12 LAB — SIGNATERA
SIGNATERA MTM READOUT: 0 MTM/ml
SIGNATERA TEST RESULT: NEGATIVE

## 2025-01-15 ENCOUNTER — Telehealth: Payer: Self-pay

## 2025-01-15 NOTE — Progress Notes (Signed)
 Pt signatera results came back negative. RN attempted to reach pt x1, left VM w/ negative results and advised to call office w/ any questions .

## 2025-04-29 ENCOUNTER — Ambulatory Visit: Admitting: Hematology and Oncology

## 2025-06-17 ENCOUNTER — Ambulatory Visit
